# Patient Record
Sex: Female | Born: 1935 | Race: White | Hispanic: No | Smoking: Never smoker
Health system: Southern US, Community
[De-identification: ages and names within clinical notes are randomized; demographics above are authoritative.]

## PROBLEM LIST (undated history)

## (undated) DIAGNOSIS — I701 Atherosclerosis of renal artery: Secondary | ICD-10-CM

## (undated) DIAGNOSIS — G4733 Obstructive sleep apnea (adult) (pediatric): Secondary | ICD-10-CM

## (undated) DIAGNOSIS — K635 Polyp of colon: Secondary | ICD-10-CM

## (undated) DIAGNOSIS — R51 Headache: Secondary | ICD-10-CM

## (undated) DIAGNOSIS — M1990 Unspecified osteoarthritis, unspecified site: Secondary | ICD-10-CM

## (undated) DIAGNOSIS — M545 Low back pain, unspecified: Secondary | ICD-10-CM

## (undated) DIAGNOSIS — R519 Headache, unspecified: Secondary | ICD-10-CM

## (undated) DIAGNOSIS — N200 Calculus of kidney: Secondary | ICD-10-CM

## (undated) DIAGNOSIS — C3490 Malignant neoplasm of unspecified part of unspecified bronchus or lung: Secondary | ICD-10-CM

## (undated) DIAGNOSIS — I2510 Atherosclerotic heart disease of native coronary artery without angina pectoris: Secondary | ICD-10-CM

## (undated) DIAGNOSIS — I951 Orthostatic hypotension: Secondary | ICD-10-CM

## (undated) DIAGNOSIS — G8929 Other chronic pain: Secondary | ICD-10-CM

## (undated) DIAGNOSIS — E871 Hypo-osmolality and hyponatremia: Secondary | ICD-10-CM

## (undated) DIAGNOSIS — R112 Nausea with vomiting, unspecified: Secondary | ICD-10-CM

## (undated) DIAGNOSIS — E785 Hyperlipidemia, unspecified: Secondary | ICD-10-CM

## (undated) DIAGNOSIS — Z9989 Dependence on other enabling machines and devices: Secondary | ICD-10-CM

## (undated) DIAGNOSIS — R040 Epistaxis: Secondary | ICD-10-CM

## (undated) DIAGNOSIS — I10 Essential (primary) hypertension: Secondary | ICD-10-CM

## (undated) DIAGNOSIS — I739 Peripheral vascular disease, unspecified: Secondary | ICD-10-CM

## (undated) DIAGNOSIS — I779 Disorder of arteries and arterioles, unspecified: Secondary | ICD-10-CM

## (undated) DIAGNOSIS — Z9889 Other specified postprocedural states: Secondary | ICD-10-CM

## (undated) HISTORY — PX: TOTAL HIP ARTHROPLASTY: SHX124

## (undated) HISTORY — PX: APPENDECTOMY: SHX54

## (undated) HISTORY — PX: REPLACEMENT TOTAL KNEE: SUR1224

## (undated) HISTORY — DX: Hypo-osmolality and hyponatremia: E87.1

## (undated) HISTORY — DX: Disorder of arteries and arterioles, unspecified: I77.9

## (undated) HISTORY — DX: Polyp of colon: K63.5

## (undated) HISTORY — PX: JOINT REPLACEMENT: SHX530

## (undated) HISTORY — DX: Malignant neoplasm of unspecified part of unspecified bronchus or lung: C34.90

## (undated) HISTORY — DX: Calculus of kidney: N20.0

## (undated) HISTORY — PX: TUBAL LIGATION: SHX77

## (undated) HISTORY — DX: Hyperlipidemia, unspecified: E78.5

## (undated) HISTORY — DX: Peripheral vascular disease, unspecified: I73.9

## (undated) HISTORY — DX: Epistaxis: R04.0

## (undated) HISTORY — PX: CATARACT EXTRACTION W/ INTRAOCULAR LENS  IMPLANT, BILATERAL: SHX1307

## (undated) HISTORY — DX: Orthostatic hypotension: I95.1

## (undated) HISTORY — DX: Unspecified osteoarthritis, unspecified site: M19.90

---

## 1978-12-05 HISTORY — PX: VAGINAL HYSTERECTOMY: SUR661

## 1998-12-05 HISTORY — PX: BILATERAL OOPHORECTOMY: SHX1221

## 2001-04-05 DIAGNOSIS — C3490 Malignant neoplasm of unspecified part of unspecified bronchus or lung: Secondary | ICD-10-CM

## 2001-04-05 HISTORY — DX: Malignant neoplasm of unspecified part of unspecified bronchus or lung: C34.90

## 2001-04-05 HISTORY — PX: LUNG LOBECTOMY: SHX167

## 2002-11-19 LAB — HM COLONOSCOPY

## 2007-05-21 LAB — HM COLONOSCOPY: HM Colonoscopy: NORMAL

## 2010-06-25 LAB — HM DEXA SCAN

## 2011-05-21 ENCOUNTER — Ambulatory Visit (INDEPENDENT_AMBULATORY_CARE_PROVIDER_SITE_OTHER)
Admission: RE | Admit: 2011-05-21 | Discharge: 2011-05-21 | Disposition: A | Discharge status: Home / Self Care | Payer: Medicare Other | Source: Ambulatory Visit | Attending: Internal Medicine | Admitting: Internal Medicine

## 2011-05-21 ENCOUNTER — Encounter: Payer: Self-pay | Admitting: Internal Medicine

## 2011-05-21 ENCOUNTER — Ambulatory Visit (INDEPENDENT_AMBULATORY_CARE_PROVIDER_SITE_OTHER): Payer: Medicare Other | Admitting: Internal Medicine

## 2011-05-21 DIAGNOSIS — E785 Hyperlipidemia, unspecified: Secondary | ICD-10-CM

## 2011-05-21 DIAGNOSIS — C3490 Malignant neoplasm of unspecified part of unspecified bronchus or lung: Secondary | ICD-10-CM

## 2011-05-21 DIAGNOSIS — I6529 Occlusion and stenosis of unspecified carotid artery: Secondary | ICD-10-CM

## 2011-05-21 DIAGNOSIS — G4730 Sleep apnea, unspecified: Secondary | ICD-10-CM | POA: Diagnosis not present

## 2011-05-21 DIAGNOSIS — M1990 Unspecified osteoarthritis, unspecified site: Secondary | ICD-10-CM

## 2011-05-21 DIAGNOSIS — Z85118 Personal history of other malignant neoplasm of bronchus and lung: Secondary | ICD-10-CM | POA: Diagnosis not present

## 2011-05-21 LAB — CBC WITH DIFFERENTIAL/PLATELET
Basophils Absolute: 0 10*3/uL (ref 0.0–0.1)
Basophils Relative: 0.3 % (ref 0.0–3.0)
Eosinophils Absolute: 0.1 10*3/uL (ref 0.0–0.7)
Hemoglobin: 13.7 g/dL (ref 12.0–15.0)
Lymphs Abs: 2 10*3/uL (ref 0.7–4.0)
MCHC: 33.8 g/dL (ref 30.0–36.0)
MCV: 87.7 fl (ref 78.0–100.0)
Monocytes Absolute: 0.5 10*3/uL (ref 0.1–1.0)
Neutro Abs: 5 10*3/uL (ref 1.4–7.7)
RBC: 4.63 Mil/uL (ref 3.87–5.11)
RDW: 12.8 % (ref 11.5–14.6)

## 2011-05-21 LAB — COMPREHENSIVE METABOLIC PANEL
ALT: 18 U/L (ref 0–35)
AST: 23 U/L (ref 0–37)
Alkaline Phosphatase: 94 U/L (ref 39–117)
BUN: 12 mg/dL (ref 6–23)
Chloride: 104 mEq/L (ref 96–112)
Creatinine, Ser: 0.6 mg/dL (ref 0.4–1.2)
Potassium: 3.8 mEq/L (ref 3.5–5.1)

## 2011-05-21 LAB — LIPID PANEL: Total CHOL/HDL Ratio: 3

## 2011-05-21 NOTE — Progress Notes (Signed)
Subjective:    Patient ID: Tamara Burton, female    DOB: [birth year], 78 y.o.   MRN: [id number]  HPI 78 year old female with history of lung cancer status post resection, hyperlipidemia, sleep apnea, and carotid stenosis presents to establish care. In regards to her lung cancer, she notes that it was diagnosed on routine x-ray. She reports that her cancer was resected in she did not receive any chemotherapy or radiation. She notes that she has been followed with chest x-rays every 6 months. She denies any new cough or shortness of breath. She does have occasional pleuritic pain in her left chest. She attributes this to scar tissue.  In regards to hyperlipidemia, she notes she is taking atorvastatin. She denies any side effects from this medication. She reports full compliance with the medicine. She notes she is due for cholesterol check.  In regards to carotid stenosis, she notes that she has a yearly carotid Dopplers to follow plaques. She is due for this. She denies any neck pain. She does occasionally have headache. She has never had a stroke or TIA.  In regards to sleep apnea, she notes that she was diagnosed with this many years ago and has been using CPAP ever since. She questions whether she still has sleep apnea and would like to have a repeat sleep study to evaluate.  Outpatient Encounter Prescriptions as of 05/21/2011  Medication Sig Dispense Refill  . Ascorbic Acid (VITAMIN C CR) 1500 MG TBCR Take by mouth daily.      Marland Kitchen aspirin 81 MG tablet Take 81 mg by mouth daily.      Marland Kitchen atorvastatin (LIPITOR) 20 MG tablet Take 20 mg by mouth daily.      . Cholecalciferol (VITAMIN D-3) 5000 UNITS TABS Take by mouth daily.      . clindamycin (CLEOCIN) 150 MG capsule Take 150 mg by mouth. Before dental work      . Lysine HCl 500 MG CAPS Take by mouth daily.      . Omega 3-6-9 Fatty Acids (TRIPLE OMEGA-3-6-9 PO) Take by mouth. 3 daily      . Zinc 50 MG TABS Take by mouth daily.         Review of  Systems  Constitutional: Negative for fever, chills, appetite change, fatigue and unexpected weight change.  HENT: Negative for ear pain, congestion, sore throat, trouble swallowing, neck pain, voice change and sinus pressure.   Eyes: Negative for visual disturbance.  Respiratory: Negative for cough, shortness of breath, wheezing and stridor.   Cardiovascular: Negative for chest pain, palpitations and leg swelling.  Gastrointestinal: Negative for nausea, vomiting, abdominal pain, diarrhea, constipation, blood in stool, abdominal distention and anal bleeding.  Genitourinary: Negative for dysuria and flank pain.  Musculoskeletal: Negative for myalgias, arthralgias and gait problem.  Skin: Negative for color change and rash.  Neurological: Negative for dizziness and headaches.  Hematological: Negative for adenopathy. Does not bruise/bleed easily.  Psychiatric/Behavioral: Negative for suicidal ideas, sleep disturbance and dysphoric mood. The patient is not nervous/anxious.    BP 140/87  Pulse 86  Temp(Src) 98.6 F (37 C) (Oral)  Resp 12  Ht 5\' 1"  (1.549 m)  Wt 134 lb (60.782 kg)  BMI 25.32 kg/m2  SpO2 99%     Objective:   Physical Exam  Constitutional: She is oriented to person, place, and time. She appears well-developed and well-nourished. No distress.  HENT:  Head: Normocephalic and atraumatic.  Right Ear: External ear normal.  Left Ear: External ear normal.  Nose: Nose normal.  Mouth/Throat: Oropharynx is clear and moist. No oropharyngeal exudate.  Eyes: Conjunctivae are normal. Pupils are equal, round, and reactive to light. Right eye exhibits no discharge. Left eye exhibits no discharge. No scleral icterus.  Neck: Normal range of motion. Neck supple. Normal carotid pulses present. Carotid bruit is not present. No tracheal deviation present. No thyromegaly present.  Cardiovascular: Normal rate, regular rhythm, normal heart sounds and intact distal pulses.  Exam reveals no gallop  and no friction rub.   No murmur heard. Pulmonary/Chest: Effort normal and breath sounds normal. No respiratory distress. She has no wheezes. She has no rales. She exhibits no tenderness.  Abdominal: Soft. Bowel sounds are normal. She exhibits no distension and no mass. There is no tenderness. There is no rebound and no guarding.  Musculoskeletal: Normal range of motion. She exhibits no edema and no tenderness.  Lymphadenopathy:    She has no cervical adenopathy.  Neurological: She is alert and oriented to person, place, and time. No cranial nerve deficit. She exhibits normal muscle tone. Coordination normal.  Skin: Skin is warm and dry. No rash noted. She is not diaphoretic. No erythema. No pallor.  Psychiatric: She has a normal mood and affect. Her behavior is normal. Judgment and thought content normal.          Assessment & Plan:

## 2011-05-21 NOTE — Assessment & Plan Note (Signed)
Will check lipids and LFTs with labs today. Continue atorvastatin. Followup in 6 months.

## 2011-05-21 NOTE — Assessment & Plan Note (Signed)
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 She does intermittently have some pain at both of these sites as well as in her right knee. She feels that this is manageable at this point. We'll continue to monitor. If symptoms progress, will set up orthopedic referral.

## 2011-05-21 NOTE — Assessment & Plan Note (Signed)
Patient reports history of carotid stenosis. Will get records for review. She notes that she is due for a repeat carotid ultrasound. We'll set this up at [facility]. Followup 6 months.

## 2011-05-21 NOTE — Assessment & Plan Note (Signed)
Patient reports history of sleep apnea. She notes that last sleep study was several years ago. Will set her up for evaluation with sleep specialist to see if repeat study might be helpful.

## 2011-05-21 NOTE — Assessment & Plan Note (Signed)
Patient reports history of lung cancer status post resection. Will get records for review. Will get chest x-ray today. Follow up 6 months.

## 2011-06-04 DIAGNOSIS — H04129 Dry eye syndrome of unspecified lacrimal gland: Secondary | ICD-10-CM | POA: Diagnosis not present

## 2011-06-10 DIAGNOSIS — I10 Essential (primary) hypertension: Secondary | ICD-10-CM | POA: Diagnosis not present

## 2011-06-10 DIAGNOSIS — I6529 Occlusion and stenosis of unspecified carotid artery: Secondary | ICD-10-CM | POA: Diagnosis not present

## 2011-06-10 DIAGNOSIS — M1990 Unspecified osteoarthritis, unspecified site: Secondary | ICD-10-CM | POA: Diagnosis not present

## 2011-06-10 DIAGNOSIS — E785 Hyperlipidemia, unspecified: Secondary | ICD-10-CM | POA: Diagnosis not present

## 2011-07-13 ENCOUNTER — Telehealth: Payer: Self-pay | Admitting: Internal Medicine

## 2011-07-13 NOTE — Telephone Encounter (Signed)
I called patient to let her know that we had her records that she had brought in and she could come and pick them up.  While I was on the phone she asked if we could please send a prescription for her Atrovastatin generic for Lipitor 20 [facility] to [facility] her ID# is [id number].  I advised patient that I would put phone note in for medication that we preferred the patient to contact pharmacy and have them fax [place] a request.  She stated that Dr. Dan Humphreys hadn't ever sent in prescription for her.

## 2011-07-14 MED ORDER — ATORVASTATIN CALCIUM 20 MG PO TABS: 20.0000 mg | ORAL_TABLET | Freq: Every day | ORAL | Status: DC

## 2011-07-14 NOTE — Telephone Encounter (Signed)
Medication sent to pharmacy at patients request 

## 2011-07-16 ENCOUNTER — Other Ambulatory Visit: Payer: Self-pay | Admitting: *Deleted

## 2011-07-16 NOTE — Telephone Encounter (Signed)
Tried calling patient, but got no answer. Left message asking patient to call the call a nurse line since it is the weekend. I can not call her in the med because I do not have a pharmacy listed other than the mail order.

## 2011-07-16 NOTE — Telephone Encounter (Signed)
Triage Record Num: [id number] Operator: Lyn Hollingshead Patient Name: Tamara Burton Call Date & Time: 07/16/2011 1:28:28PM Patient Phone: [phone] PCP: Ronna Polio Patient Gender: Female PCP Fax : [phone] Patient DOB: 20-Jul-1933 Practice Name: [facility] Reason for Call: Caller: Jaya/Mother; PCP: Ronna Polio; CB#: [phone]; ; ; Call regarding Tick Was Removed, the Area Is Red and Bump It Itches; Onset- 07/15/11 Afebrile. Pt daughter pulled a tick off of her shoulder. There is a dime size area of redness, denies rash. Pt states she suspects it got on her last night.She thought it was one of her moles that was itching. Emergent s/s of Bites and Stings protocol r/o. Home care advice given. Protocol(s) Used: Bites and Stings - Insects or Spiders Recommended Outcome per Protocol: Provide Home/Self Care Reason for Outcome: Tick bite Care Advice: ~ Call provider if symptoms worsen or new symptoms develop. If, more than 24 hours after the incident, the sting/bite site or area around sting/bite site becomes increasingly swollen, red or painful, or has a purulent or foul smelling discharge, or red streaks develop leading away from the site, call provider immediately. ~ If you find a tick on yourself, it is important to take it off as soon as possible. The tick transmitting [facility] Spotted Fever can cause an infection after several hours of attachment. The risk of getting Lyme disease is much lower if the tick is removed within 36 hours. ~ See a provider today if develop rash (may be bulls-eye type or red, spotted rash), fever, headache; joint or muscle pain, or swollen lymph nodes within 30 days of a tick bite. ~ After the tick is removed, wash hands with soap and water and disinfect the bite site with iodine scrub, rubbing alcohol, or soap and water. ~ ~ SYMPTOM / CONDITION MANAGEMENT 04/

## 2011-07-16 NOTE — Telephone Encounter (Signed)
Call her in doxycycline 100 mg twice daily.,  take with food,  7 days  #14 o refills

## 2011-07-19 MED ORDER — DOXYCYCLINE HYCLATE 100 MG PO TABS: 100.0000 mg | ORAL_TABLET | Freq: Two times a day (BID) | ORAL | Status: AC

## 2011-07-19 NOTE — Telephone Encounter (Signed)
Patient returned call and was notified. Rx sent to pharmacy.  

## 2011-11-19 ENCOUNTER — Encounter: Payer: BC Managed Care – PPO | Admitting: Internal Medicine

## 2011-11-26 ENCOUNTER — Ambulatory Visit (INDEPENDENT_AMBULATORY_CARE_PROVIDER_SITE_OTHER)
Admission: RE | Admit: 2011-11-26 | Discharge: 2011-11-26 | Disposition: A | Discharge status: Home / Self Care | Payer: Medicare Other | Source: Ambulatory Visit | Attending: Internal Medicine | Admitting: Internal Medicine

## 2011-11-26 ENCOUNTER — Encounter: Payer: Self-pay | Admitting: Internal Medicine

## 2011-11-26 ENCOUNTER — Ambulatory Visit (INDEPENDENT_AMBULATORY_CARE_PROVIDER_SITE_OTHER): Payer: Medicare Other | Admitting: Internal Medicine

## 2011-11-26 VITALS — BP 142/82 | HR 71 | Temp 98.3°F | Ht 61.0 in | Wt 133.0 lb

## 2011-11-26 DIAGNOSIS — Z0000 Encounter for general adult medical examination without abnormal findings: Secondary | ICD-10-CM

## 2011-11-26 DIAGNOSIS — Z1239 Encounter for other screening for malignant neoplasm of breast: Secondary | ICD-10-CM | POA: Diagnosis not present

## 2011-11-26 DIAGNOSIS — Z85118 Personal history of other malignant neoplasm of bronchus and lung: Secondary | ICD-10-CM

## 2011-11-26 DIAGNOSIS — M1990 Unspecified osteoarthritis, unspecified site: Secondary | ICD-10-CM

## 2011-11-26 DIAGNOSIS — E785 Hyperlipidemia, unspecified: Secondary | ICD-10-CM | POA: Insufficient documentation

## 2011-11-26 DIAGNOSIS — D649 Anemia, unspecified: Secondary | ICD-10-CM

## 2011-11-26 DIAGNOSIS — H9190 Unspecified hearing loss, unspecified ear: Secondary | ICD-10-CM

## 2011-11-26 DIAGNOSIS — R918 Other nonspecific abnormal finding of lung field: Secondary | ICD-10-CM | POA: Diagnosis not present

## 2011-11-26 MED ORDER — ATORVASTATIN CALCIUM 20 MG PO TABS: 20.0000 mg | ORAL_TABLET | Freq: Every day | ORAL | Status: DC

## 2011-11-26 NOTE — Assessment & Plan Note (Signed)
Occasional arthralgia noted by patient. Symptoms currently well-controlled with over-the-counter nonsteroidal medications. Will continue.

## 2011-11-26 NOTE — Assessment & Plan Note (Signed)
Hearing loss noted. Will set up audiology referral for evaluation.

## 2011-11-26 NOTE — Progress Notes (Signed)
Subjective:    Patient ID: Tamara Burton, female    DOB: [birth year], 78 y.o.   MRN: [id number]  HPI [name] for annual Medicare wellness examination and management of other chronic and acute problems.   The risk factors are reflected in the social history.  The roster of all physicians providing medical care to patient - is listed in the Snapshot section of the chart.  Activities of daily living:  The patient is 100% independent in all ADLs: dressing, toileting, feeding as well as independent mobility  Home safety : The patient has smoke detectors in the home. They wear seatbelts.  There are no firearms at home. There is no violence in the home.   There is no risks for hepatitis, STDs or HIV. There is no history of blood transfusion. They have no travel history to infectious disease endemic areas of the world.  The patient has seen their dentist in the last six month [facility]). They have seen their eye doctor in the last year [facility]).  Occasional noted loss of hearing, esp in left ear. Occasional ringing in ears.  They have deferred audiologic testing in the last year. Interested in setting up Audiology eval.  They do not  have excessive sun exposure. Discussed the need for sun protection: hats, long sleeves and use of sunscreen if there is significant sun exposure.   Diet: the importance of a healthy diet is discussed. They do have a healthy diet.  The benefits of regular aerobic exercise were discussed. She walks daily either outside or treadmill.  HCPOA - in place.  Depression screen: there are no signs or vegative symptoms of depression- irritability, change in appetite, anhedonia, sadness/tearfullness, with exception of grief with death of grandson this year.  Cognitive assessment: the patient manages all their financial and personal affairs and is actively engaged. They could relate day,date,year and events.  The following portions of the patient's  history were reviewed and updated as appropriate: allergies, current medications, past family history, past medical history,  past surgical history, past social history  and problem list.  Visual acuity was not assessed per patient preference since she has regular follow up with her ophthalmologist. Hearing and body mass index were assessed and reviewed.   During the course of the visit the patient was educated and counseled about appropriate screening and preventive services including : fall prevention , diabetes screening, nutrition counseling, colorectal cancer screening, and recommended immunizations.     Outpatient Encounter Prescriptions as of 11/26/2011  Medication Sig Dispense Refill  . Ascorbic Acid (VITAMIN C CR) 1500 MG TBCR Take by mouth daily.      Marland Kitchen aspirin 81 MG tablet Take 81 mg by mouth daily.      Marland Kitchen atorvastatin (LIPITOR) 20 MG tablet Take 1 tablet (20 mg total) by mouth daily.  90 tablet  3  . Calcium Carbonate-Vit D-Min (CALCIUM 1200 PO) Take 1 tablet by mouth 2 (two) times daily.      . Cholecalciferol (VITAMIN D-3) 5000 UNITS TABS Take by mouth daily.      . clindamycin (CLEOCIN) 150 MG capsule Take 150 mg by mouth. Before dental work      . Lysine HCl 500 MG CAPS Take by mouth daily.      . Omega 3-6-9 Fatty Acids (TRIPLE OMEGA-3-6-9 PO) Take by mouth. 3 daily      . Zinc 50 MG TABS Take by mouth daily.      Marland Kitchen DISCONTD: atorvastatin (LIPITOR)  20 MG tablet Take 1 tablet (20 mg total) by mouth daily.  90 tablet  1    Review of Systems  Constitutional: Negative for fever, chills, appetite change, fatigue and unexpected weight change.  HENT: Positive for hearing loss. Negative for ear pain, congestion, sore throat, trouble swallowing, neck pain, voice change and sinus pressure.   Eyes: Negative for visual disturbance.  Respiratory: Negative for cough, shortness of breath, wheezing and stridor.   Cardiovascular: Negative for chest pain, palpitations and leg swelling.    Gastrointestinal: Negative for nausea, vomiting, abdominal pain, diarrhea, constipation, blood in stool, abdominal distention and anal bleeding.  Genitourinary: Negative for dysuria and flank pain.  Musculoskeletal: Positive for arthralgias. Negative for myalgias and gait problem.  Skin: Negative for color change and rash.  Neurological: Negative for dizziness and headaches.  Hematological: Negative for adenopathy. Does not bruise/bleed easily.  Psychiatric/Behavioral: Negative for suicidal ideas, disturbed wake/sleep cycle and dysphoric mood. The patient is not nervous/anxious.    BP 142/82  Pulse 71  Temp 98.3 F (36.8 C) (Oral)  Ht 5\' 1"  (1.549 m)  Wt 133 lb (60.328 kg)  BMI 25.13 kg/m2  SpO2 97%     Objective:   Physical Exam  Constitutional: She is oriented to person, place, and time. She appears well-developed and well-nourished. No distress.  HENT:  Head: Normocephalic and atraumatic.  Right Ear: External ear normal.  Left Ear: External ear normal.  Nose: Nose normal.  Mouth/Throat: Oropharynx is clear and moist. No oropharyngeal exudate.  Eyes: Conjunctivae are normal. Pupils are equal, round, and reactive to light. Right eye exhibits no discharge. Left eye exhibits no discharge. No scleral icterus.  Neck: Normal range of motion. Neck supple. No tracheal deviation present. No thyromegaly present.  Cardiovascular: Normal rate, regular rhythm, normal heart sounds and intact distal pulses.  Exam reveals no gallop and no friction rub.   No murmur heard. Pulmonary/Chest: Effort normal and breath sounds normal. No respiratory distress. She has no wheezes. She has no rales. She exhibits no tenderness.  Abdominal: Soft. Bowel sounds are normal. She exhibits no distension and no mass. There is no tenderness. There is no rebound and no guarding.  Musculoskeletal: Normal range of motion. She exhibits no edema and no tenderness.  Lymphadenopathy:    She has no cervical adenopathy.   Neurological: She is alert and oriented to person, place, and time. No cranial nerve deficit. She exhibits normal muscle tone. Coordination normal.  Skin: Skin is warm and dry. No rash noted. She is not diaphoretic. No erythema. No pallor.  Psychiatric: She has a normal mood and affect. Her behavior is normal. Judgment and thought content normal.          Assessment & Plan:

## 2011-11-26 NOTE — Assessment & Plan Note (Signed)
Will plan to repeat six-month screening chest x-ray.

## 2011-11-26 NOTE — Assessment & Plan Note (Signed)
Will check fasting lipids and LFTs with labs.  

## 2011-11-26 NOTE — Assessment & Plan Note (Signed)
General exam normal today. Patient is up-to-date on health maintenance with the exception of mammogram which will be scheduled. Vaccinations are up-to-date. Screening today was remarkable for hearing loss and audiology referral was placed. Encourage continued efforts at healthy lifestyle including high-fiber diet low in saturated fat and regular physical activity. Followup 6 months.

## 2011-11-26 NOTE — Assessment & Plan Note (Signed)
Order for mammogram place today.

## 2011-11-29 DIAGNOSIS — H903 Sensorineural hearing loss, bilateral: Secondary | ICD-10-CM | POA: Diagnosis not present

## 2011-12-01 ENCOUNTER — Other Ambulatory Visit (INDEPENDENT_AMBULATORY_CARE_PROVIDER_SITE_OTHER): Payer: Medicare Other | Admitting: *Deleted

## 2011-12-01 DIAGNOSIS — E785 Hyperlipidemia, unspecified: Secondary | ICD-10-CM

## 2011-12-01 DIAGNOSIS — D649 Anemia, unspecified: Secondary | ICD-10-CM | POA: Diagnosis not present

## 2011-12-01 LAB — CBC WITH DIFFERENTIAL/PLATELET
Basophils Absolute: 0 K/uL (ref 0.0–0.1)
Basophils Relative: 0.4 % (ref 0.0–3.0)
Eosinophils Absolute: 0.1 K/uL (ref 0.0–0.7)
Eosinophils Relative: 1.8 % (ref 0.0–5.0)
HCT: 39.6 % (ref 36.0–46.0)
Hemoglobin: 13.1 g/dL (ref 12.0–15.0)
Lymphocytes Relative: 33.8 % (ref 12.0–46.0)
Lymphs Abs: 2 K/uL (ref 0.7–4.0)
MCHC: 33.1 g/dL (ref 30.0–36.0)
MCV: 89.3 fl (ref 78.0–100.0)
Monocytes Absolute: 0.5 K/uL (ref 0.1–1.0)
Monocytes Relative: 8.3 % (ref 3.0–12.0)
Neutro Abs: 3.3 K/uL (ref 1.4–7.7)
Neutrophils Relative %: 55.7 % (ref 43.0–77.0)
Platelets: 194 K/uL (ref 150.0–400.0)
RBC: 4.44 Mil/uL (ref 3.87–5.11)
RDW: 13.1 % (ref 11.5–14.6)
WBC: 5.9 K/uL (ref 4.5–10.5)

## 2011-12-01 LAB — COMPREHENSIVE METABOLIC PANEL WITH GFR
ALT: 17 U/L (ref 0–35)
AST: 25 U/L (ref 0–37)
Albumin: 4 g/dL (ref 3.5–5.2)
Alkaline Phosphatase: 80 U/L (ref 39–117)
BUN: 13 mg/dL (ref 6–23)
CO2: 30 meq/L (ref 19–32)
Calcium: 9.1 mg/dL (ref 8.4–10.5)
Chloride: 103 meq/L (ref 96–112)
Creatinine, Ser: 0.7 mg/dL (ref 0.4–1.2)
GFR: 85 mL/min
Glucose, Bld: 92 mg/dL (ref 70–99)
Potassium: 4.2 meq/L (ref 3.5–5.1)
Sodium: 141 meq/L (ref 135–145)
Total Bilirubin: 1 mg/dL (ref 0.3–1.2)
Total Protein: 6.4 g/dL (ref 6.0–8.3)

## 2011-12-01 LAB — LIPID PANEL
LDL Cholesterol: 101 mg/dL — ABNORMAL HIGH (ref 0–99)
VLDL: 18 mg/dL (ref 0.0–40.0)

## 2011-12-13 DIAGNOSIS — I10 Essential (primary) hypertension: Secondary | ICD-10-CM | POA: Diagnosis not present

## 2011-12-13 DIAGNOSIS — E669 Obesity, unspecified: Secondary | ICD-10-CM | POA: Diagnosis not present

## 2011-12-13 DIAGNOSIS — I6529 Occlusion and stenosis of unspecified carotid artery: Secondary | ICD-10-CM | POA: Diagnosis not present

## 2011-12-13 DIAGNOSIS — E785 Hyperlipidemia, unspecified: Secondary | ICD-10-CM | POA: Diagnosis not present

## 2011-12-17 ENCOUNTER — Ambulatory Visit: Payer: Self-pay | Admitting: Internal Medicine

## 2011-12-17 DIAGNOSIS — Z1231 Encounter for screening mammogram for malignant neoplasm of breast: Secondary | ICD-10-CM | POA: Diagnosis not present

## 2012-01-11 ENCOUNTER — Encounter: Payer: Self-pay | Admitting: Internal Medicine

## 2012-01-12 DIAGNOSIS — Z23 Encounter for immunization: Secondary | ICD-10-CM | POA: Diagnosis not present

## 2012-02-02 ENCOUNTER — Encounter: Payer: Self-pay | Admitting: Internal Medicine

## 2012-02-02 DIAGNOSIS — M169 Osteoarthritis of hip, unspecified: Secondary | ICD-10-CM

## 2012-02-02 DIAGNOSIS — Z1283 Encounter for screening for malignant neoplasm of skin: Secondary | ICD-10-CM

## 2012-02-03 ENCOUNTER — Encounter: Payer: Self-pay | Admitting: Internal Medicine

## 2012-02-09 DIAGNOSIS — Z96649 Presence of unspecified artificial hip joint: Secondary | ICD-10-CM | POA: Diagnosis not present

## 2012-03-07 ENCOUNTER — Encounter: Payer: Self-pay | Admitting: Internal Medicine

## 2012-03-23 DIAGNOSIS — L819 Disorder of pigmentation, unspecified: Secondary | ICD-10-CM | POA: Diagnosis not present

## 2012-03-23 DIAGNOSIS — L820 Inflamed seborrheic keratosis: Secondary | ICD-10-CM | POA: Diagnosis not present

## 2012-03-23 DIAGNOSIS — L821 Other seborrheic keratosis: Secondary | ICD-10-CM | POA: Diagnosis not present

## 2012-04-20 DIAGNOSIS — Q142 Congenital malformation of optic disc: Secondary | ICD-10-CM | POA: Diagnosis not present

## 2012-05-20 ENCOUNTER — Other Ambulatory Visit: Payer: Self-pay

## 2012-06-13 ENCOUNTER — Telehealth: Payer: Self-pay | Admitting: Internal Medicine

## 2012-06-13 NOTE — Telephone Encounter (Signed)
Patient informed and appointment has been rescheduled. Explain to patient her chest xray and labs will be discussed at the time of her visit.

## 2012-06-13 NOTE — Telephone Encounter (Signed)
Spoke with patient, has her annual physical in August and she usually gets her blood work done twice a year. She had lung cancer in the past, so they require her to have a chest xray done yearly. Would prefer to have her appointment in the early spring. Patient is new to the office, just moved down here last year. Calling to see how this is going to be handled since she is a patient here now? She has never had the shingles vaccine and interested in this.

## 2012-06-13 NOTE — Telephone Encounter (Signed)
Patient wants labs and a chest x-ray done before August .

## 2012-06-13 NOTE — Telephone Encounter (Signed)
Please just schedule her a visit within the next 1-2 months.

## 2012-07-06 ENCOUNTER — Encounter: Payer: Self-pay | Admitting: Internal Medicine

## 2012-07-06 ENCOUNTER — Ambulatory Visit (INDEPENDENT_AMBULATORY_CARE_PROVIDER_SITE_OTHER): Payer: Medicare Other | Admitting: Internal Medicine

## 2012-07-06 VITALS — BP 130/80 | HR 68 | Temp 98.0°F | Resp 18 | Wt 125.5 lb

## 2012-07-06 DIAGNOSIS — L659 Nonscarring hair loss, unspecified: Secondary | ICD-10-CM | POA: Insufficient documentation

## 2012-07-06 DIAGNOSIS — R5381 Other malaise: Secondary | ICD-10-CM | POA: Insufficient documentation

## 2012-07-06 DIAGNOSIS — E785 Hyperlipidemia, unspecified: Secondary | ICD-10-CM | POA: Diagnosis not present

## 2012-07-06 DIAGNOSIS — IMO0001 Reserved for inherently not codable concepts without codable children: Secondary | ICD-10-CM

## 2012-07-06 DIAGNOSIS — R5383 Other fatigue: Secondary | ICD-10-CM

## 2012-07-06 DIAGNOSIS — Z634 Disappearance and death of family member: Secondary | ICD-10-CM | POA: Insufficient documentation

## 2012-07-06 DIAGNOSIS — M791 Myalgia, unspecified site: Secondary | ICD-10-CM | POA: Insufficient documentation

## 2012-07-06 DIAGNOSIS — G4700 Insomnia, unspecified: Secondary | ICD-10-CM | POA: Insufficient documentation

## 2012-07-06 LAB — CBC WITH DIFFERENTIAL/PLATELET
Basophils Absolute: 0 10*3/uL (ref 0.0–0.1)
Eosinophils Absolute: 0.1 10*3/uL (ref 0.0–0.7)
Hemoglobin: 13.7 g/dL (ref 12.0–15.0)
Lymphocytes Relative: 29.4 % (ref 12.0–46.0)
Lymphs Abs: 2 10*3/uL (ref 0.7–4.0)
MCHC: 33.8 g/dL (ref 30.0–36.0)
Monocytes Relative: 5.7 % (ref 3.0–12.0)
Neutro Abs: 4.3 10*3/uL (ref 1.4–7.7)
Platelets: 201 10*3/uL (ref 150.0–400.0)
RDW: 12.9 % (ref 11.5–14.6)

## 2012-07-06 LAB — COMPREHENSIVE METABOLIC PANEL
ALT: 19 U/L (ref 0–35)
AST: 34 U/L (ref 0–37)
Albumin: 4.2 g/dL (ref 3.5–5.2)
Alkaline Phosphatase: 88 U/L (ref 39–117)
Potassium: 3.7 mEq/L (ref 3.5–5.1)
Sodium: 137 mEq/L (ref 135–145)
Total Protein: 6.6 g/dL (ref 6.0–8.3)

## 2012-07-06 LAB — CK: Total CK: 718 U/L — ABNORMAL HIGH (ref 7–177)

## 2012-07-06 LAB — TSH: TSH: 0.81 u[IU]/mL (ref 0.35–5.50)

## 2012-07-06 LAB — LIPID PANEL
HDL: 65 mg/dL (ref >39.00)
LDL Cholesterol: 99 mg/dL (ref 0–99)
VLDL: 21.8 mg/dL (ref 0.0–40.0)

## 2012-07-06 LAB — VITAMIN B12: Vitamin B-12: 828 pg/mL (ref 211–911)

## 2012-07-06 MED ORDER — TRAZODONE HCL 50 MG PO TABS: 25.0000 mg | ORAL_TABLET | Freq: Every evening | ORAL | Status: DC | PRN

## 2012-07-06 NOTE — Assessment & Plan Note (Signed)
Bilateral LE muscle pain. Question if this may be related to use of atorvastatin. Will check LFTs and CK with labs today.

## 2012-07-06 NOTE — Assessment & Plan Note (Signed)
Symptoms of fatigue likely related to recent stressors. Will check CBC, CMP, TSH, B12. Will treat insomnia as above. Follow up prn and in 4 months.

## 2012-07-06 NOTE — Assessment & Plan Note (Signed)
Recent loss of her dog. Offered support today. Discussed counseling through hospice.

## 2012-07-06 NOTE — Progress Notes (Signed)
Subjective:    Patient ID: Tamara Burton, female    DOB: Aug 10, 1933, 79 y.o.   MRN: [id number]  HPI 79 year old female with history of hyperlipidemia and lung cancer presents for followup. She reports that the last few months have been difficult for her. She recently lost her dog unexpectedly. She is tearful describing this. She reports good support from friends. She is having some difficulty sleeping. She has never taken medication for this. She also notes some ongoing fatigue. She denies any focal symptoms such as chest pain, shortness of breath, change in appetite or weight. She does note some thinning of her hair. She is trying to follow a healthy diet. She reports compliance with her medications.   She notes some chronic muscle pain in her bilateral lower legs. This is been ongoing for months. It occurs mostly at rest. She has not taken any medication for it. She denies any trauma to her legs. She denies any swelling or weakness in her legs.  Outpatient Encounter Prescriptions as of 07/06/2012  Medication Sig Dispense Refill  . Ascorbic Acid (VITAMIN C CR) 1500 MG TBCR Take by mouth daily.      Marland Kitchen aspirin 81 MG tablet Take 81 mg by mouth daily.      Marland Kitchen atorvastatin (LIPITOR) 20 MG tablet Take 1 tablet (20 mg total) by mouth daily.  90 tablet  3  . Calcium Carbonate-Vit D-Min (CALCIUM 1200 PO) Take 1 tablet by mouth 2 (two) times daily.      . Cholecalciferol (VITAMIN D-3) 5000 UNITS TABS Take by mouth daily.      . clindamycin (CLEOCIN) 150 MG capsule Take 150 mg by mouth. Before dental work      . Lysine HCl 500 MG CAPS Take by mouth daily.      . Omega 3-6-9 Fatty Acids (TRIPLE OMEGA-3-6-9 PO) Take by mouth. 3 daily      . Zinc 50 MG TABS Take by mouth daily.      . traZODone (DESYREL) 50 MG tablet Take 0.5-1 tablets (25-50 mg total) by mouth at bedtime as needed for sleep.  30 tablet  3   No facility-administered encounter medications on file as of 07/06/2012.   BP 130/80  Pulse 68   Temp(Src) 98 F (36.7 C) (Oral)  Resp 18  Wt 125 lb 8 oz (56.926 kg)  BMI 23.73 kg/m2  SpO2 95%  Review of Systems  Constitutional: Positive for fatigue. Negative for fever, chills, appetite change and unexpected weight change.  HENT: Negative for ear pain, congestion, sore throat, trouble swallowing, neck pain, voice change and sinus pressure.   Eyes: Negative for visual disturbance.  Respiratory: Negative for cough, shortness of breath, wheezing and stridor.   Cardiovascular: Negative for chest pain, palpitations and leg swelling.  Gastrointestinal: Negative for nausea, vomiting, abdominal pain, diarrhea, constipation, blood in stool, abdominal distention and anal bleeding.  Genitourinary: Negative for dysuria and flank pain.  Musculoskeletal: Negative for myalgias, arthralgias and gait problem.  Skin: Negative for color change and rash.  Neurological: Negative for dizziness and headaches.  Hematological: Negative for adenopathy. Does not bruise/bleed easily.  Psychiatric/Behavioral: Positive for sleep disturbance and dysphoric mood. Negative for suicidal ideas. The patient is not nervous/anxious.        Objective:   Physical Exam  Constitutional: She is oriented to person, place, and time. She appears well-developed and well-nourished. No distress.  HENT:  Head: Normocephalic and atraumatic.  Right Ear: External ear normal.  Left Ear: External ear normal.  Nose: Nose normal.  Mouth/Throat: Oropharynx is clear and moist. No oropharyngeal exudate.  Eyes: Conjunctivae are normal. Pupils are equal, round, and reactive to light. Right eye exhibits no discharge. Left eye exhibits no discharge. No scleral icterus.  Neck: Normal range of motion. Neck supple. No tracheal deviation present. No thyromegaly present.  Cardiovascular: Normal rate, regular rhythm, normal heart sounds and intact distal pulses.  Exam reveals no gallop and no friction rub.   No murmur heard. Pulmonary/Chest:  Effort normal and breath sounds normal. No accessory muscle usage. Not tachypneic. No respiratory distress. She has no decreased breath sounds. She has no wheezes. She has no rhonchi. She has no rales. She exhibits no tenderness.  Musculoskeletal: Normal range of motion. She exhibits no edema and no tenderness.  Lymphadenopathy:    She has no cervical adenopathy.  Neurological: She is alert and oriented to person, place, and time. No cranial nerve deficit. She exhibits normal muscle tone. Coordination normal.  Skin: Skin is warm and dry. No rash noted. She is not diaphoretic. No erythema. No pallor.  Psychiatric: Her speech is normal and behavior is normal. Judgment and thought content normal. Cognition and memory are normal. She exhibits a depressed mood.          Assessment & Plan:

## 2012-07-06 NOTE — Assessment & Plan Note (Signed)
Recent diffuse thinning of hair. Suspect related to recent stressors. Will check TSH and CBC with labs today.

## 2012-07-06 NOTE — Assessment & Plan Note (Signed)
Will check lipids and LFTs with labs today. Continue Atorvastatin. 

## 2012-07-06 NOTE — Assessment & Plan Note (Signed)
Symptoms of insomnia related to recent stressors. Will try Trazodone. Follow up prn and 4 months.

## 2012-07-07 ENCOUNTER — Telehealth: Payer: Self-pay | Admitting: *Deleted

## 2012-07-07 NOTE — Telephone Encounter (Signed)
No, she needs a CMP and CK for rhabdomyolysis

## 2012-07-07 NOTE — Telephone Encounter (Signed)
Pt is coming in for labs Monday 04.07.2014, is it just for a CMP ?  Thank you

## 2012-07-10 ENCOUNTER — Other Ambulatory Visit (INDEPENDENT_AMBULATORY_CARE_PROVIDER_SITE_OTHER): Payer: Medicare Other

## 2012-07-10 ENCOUNTER — Other Ambulatory Visit: Payer: Self-pay | Admitting: *Deleted

## 2012-07-10 DIAGNOSIS — M6282 Rhabdomyolysis: Secondary | ICD-10-CM

## 2012-07-10 LAB — COMPREHENSIVE METABOLIC PANEL
AST: 27 U/L (ref 0–37)
Alkaline Phosphatase: 89 U/L (ref 39–117)
BUN: 13 mg/dL (ref 6–23)
Glucose, Bld: 77 mg/dL (ref 70–99)
Sodium: 142 mEq/L (ref 135–145)
Total Bilirubin: 0.6 mg/dL (ref 0.3–1.2)

## 2012-07-11 ENCOUNTER — Telehealth: Payer: Self-pay | Admitting: *Deleted

## 2012-07-11 NOTE — Telephone Encounter (Signed)
Message copied by Theola Sequin on Tue Jul 11, 2012  8:51 AM ------      Message from: Ronna Polio A      Created: Mon Jul 10, 2012 12:04 PM       Labs show that markers of muscle damage have improved. Has muscle pain improved? I would like for pt to stay off Atorvastatin. ------

## 2012-07-11 NOTE — Telephone Encounter (Signed)
Spoke with patient in reference to her lab results, she state her muscle pain has not improved, it is about the same. Would like to know if you would like her to take something in place of the Atorvastatin?

## 2012-07-11 NOTE — Telephone Encounter (Signed)
I would like for her to stay off all statins for now. I would like to see her back in 2 weeks, at which time we can repeat a CK level. If pain is severe, she will need to be seen sooner.

## 2012-07-11 NOTE — Telephone Encounter (Signed)
Left message to call back  

## 2012-07-12 NOTE — Telephone Encounter (Signed)
Pt.notified

## 2012-07-18 ENCOUNTER — Other Ambulatory Visit: Payer: Self-pay | Admitting: Internal Medicine

## 2012-07-18 ENCOUNTER — Encounter: Payer: Self-pay | Admitting: Internal Medicine

## 2012-08-11 ENCOUNTER — Ambulatory Visit: Payer: Medicare Other | Admitting: Internal Medicine

## 2012-08-31 ENCOUNTER — Ambulatory Visit (INDEPENDENT_AMBULATORY_CARE_PROVIDER_SITE_OTHER): Payer: Medicare Other | Admitting: Internal Medicine

## 2012-08-31 ENCOUNTER — Encounter: Payer: Self-pay | Admitting: Internal Medicine

## 2012-08-31 VITALS — BP 140/76 | HR 69 | Temp 98.3°F | Wt 125.0 lb

## 2012-08-31 DIAGNOSIS — M791 Myalgia, unspecified site: Secondary | ICD-10-CM

## 2012-08-31 DIAGNOSIS — IMO0001 Reserved for inherently not codable concepts without codable children: Secondary | ICD-10-CM | POA: Diagnosis not present

## 2012-08-31 DIAGNOSIS — Z789 Other specified health status: Secondary | ICD-10-CM

## 2012-08-31 DIAGNOSIS — E785 Hyperlipidemia, unspecified: Secondary | ICD-10-CM | POA: Diagnosis not present

## 2012-08-31 DIAGNOSIS — Z888 Allergy status to other drugs, medicaments and biological substances status: Secondary | ICD-10-CM | POA: Diagnosis not present

## 2012-08-31 DIAGNOSIS — Z85118 Personal history of other malignant neoplasm of bronchus and lung: Secondary | ICD-10-CM

## 2012-08-31 NOTE — Progress Notes (Signed)
Subjective:    Patient ID: Tamara Burton, female    DOB: [birth year], 79 y.o.   MRN: [id number]  HPI 79 year old female with history of lung cancer, hyperlipidemia presents for followup after recent episode of myalgia. Symptoms have completely resolved after stopping statin medication. CK level is now normal. She reports she is feeling well. She is following a healthy diet and exercising by walking daily. She denies any new concerns today.  Outpatient Encounter Prescriptions as of 08/31/2012  Medication Sig Dispense Refill  . Ascorbic Acid (VITAMIN C CR) 1500 MG TBCR Take by mouth daily.      Marland Kitchen aspirin 81 MG tablet Take 81 mg by mouth daily.      . Calcium Carbonate-Vit D-Min (CALCIUM 1200 PO) Take 1 tablet by mouth 2 (two) times daily.      . Cholecalciferol (VITAMIN D-3) 5000 UNITS TABS Take by mouth daily.      Marland Kitchen Lysine HCl 500 MG CAPS Take by mouth daily.      . traZODone (DESYREL) 50 MG tablet Take 0.5-1 tablets (25-50 mg total) by mouth at bedtime as needed for sleep.  30 tablet  3  . Zinc 50 MG TABS Take by mouth daily.      . [DISCONTINUED] clindamycin (CLEOCIN) 150 MG capsule Take 150 mg by mouth. Before dental work      . [DISCONTINUED] Omega 3-6-9 Fatty Acids (TRIPLE OMEGA-3-6-9 PO) Take by mouth. 3 daily       No facility-administered encounter medications on file as of 08/31/2012.   BP 140/76  Pulse 69  Temp(Src) 98.3 F (36.8 C) (Oral)  Wt 125 lb (56.7 kg)  BMI 23.63 kg/m2  SpO2 96%  Review of Systems  Constitutional: Negative for fever, chills, appetite change, fatigue and unexpected weight change.  HENT: Negative for ear pain, congestion, sore throat, trouble swallowing, neck pain, voice change and sinus pressure.   Eyes: Negative for visual disturbance.  Respiratory: Negative for cough, shortness of breath, wheezing and stridor.   Cardiovascular: Negative for chest pain, palpitations and leg swelling.  Gastrointestinal: Negative for nausea, vomiting, abdominal pain,  diarrhea, constipation, blood in stool, abdominal distention and anal bleeding.  Genitourinary: Negative for dysuria and flank pain.  Musculoskeletal: Negative for myalgias, arthralgias and gait problem.  Skin: Negative for color change and rash.  Neurological: Negative for dizziness and headaches.  Hematological: Negative for adenopathy. Does not bruise/bleed easily.  Psychiatric/Behavioral: Negative for suicidal ideas, sleep disturbance and dysphoric mood. The patient is not nervous/anxious.        Objective:   Physical Exam  Constitutional: She is oriented to person, place, and time. She appears well-developed and well-nourished. No distress.  HENT:  Head: Normocephalic and atraumatic.  Right Ear: External ear normal.  Left Ear: External ear normal.  Nose: Nose normal.  Mouth/Throat: Oropharynx is clear and moist. No oropharyngeal exudate.  Eyes: Conjunctivae are normal. Pupils are equal, round, and reactive to light. Right eye exhibits no discharge. Left eye exhibits no discharge. No scleral icterus.  Neck: Normal range of motion. Neck supple. No tracheal deviation present. No thyromegaly present.  Cardiovascular: Normal rate, regular rhythm, normal heart sounds and intact distal pulses.  Exam reveals no gallop and no friction rub.   No murmur heard. Pulmonary/Chest: Effort normal and breath sounds normal. No accessory muscle usage. Not tachypneic. No respiratory distress. She has no decreased breath sounds. She has no wheezes. She has no rhonchi. She has no rales. She exhibits no tenderness.  Musculoskeletal: Normal  range of motion. She exhibits no edema and no tenderness.  Lymphadenopathy:    She has no cervical adenopathy.  Neurological: She is alert and oriented to person, place, and time. No cranial nerve deficit. She exhibits normal muscle tone. Coordination normal.  Skin: Skin is warm and dry. No rash noted. She is not diaphoretic. No erythema. No pallor.  Psychiatric: She has  a normal mood and affect. Her behavior is normal. Judgment and thought content normal.          Assessment & Plan:

## 2012-08-31 NOTE — Assessment & Plan Note (Signed)
Will repeat annual CXR in 11/2012.

## 2012-08-31 NOTE — Assessment & Plan Note (Signed)
Encouraged healthy diet, low in processed carbohydrates and saturated fat. Encouraged regular exercise, goal of 3x per week.  She is intolerant to statins, so will refrain from use.

## 2012-08-31 NOTE — Assessment & Plan Note (Signed)
Myalgia with elevated CK, now normalized after stopping statin.

## 2012-08-31 NOTE — Assessment & Plan Note (Signed)
Myalgia resolved after stopping statin. CK level normalized. Will refrain from repeat use of statins. Will monitor symptoms.

## 2012-09-08 ENCOUNTER — Ambulatory Visit (INDEPENDENT_AMBULATORY_CARE_PROVIDER_SITE_OTHER)
Admission: RE | Admit: 2012-09-08 | Discharge: 2012-09-08 | Disposition: A | Discharge status: Home / Self Care | Payer: Medicare Other | Source: Ambulatory Visit | Attending: Internal Medicine | Admitting: Internal Medicine

## 2012-09-08 DIAGNOSIS — Z85118 Personal history of other malignant neoplasm of bronchus and lung: Secondary | ICD-10-CM

## 2012-09-21 ENCOUNTER — Telehealth: Payer: Self-pay | Admitting: Internal Medicine

## 2012-09-21 NOTE — Telephone Encounter (Signed)
Does pt need dr appointment or nurse appointment for the below my chart message   Appointment Request From: Tamara Burton      With Provider: Wynona Dove, MD [-Primary Care Physician-]      Preferred Date Range: From 10/09/2012 To 10/20/2012      Preferred Times: Any      Reason: To address the following health maintenance concerns.   Tetanus/Tdap   Zostavax      Comments:   Any day is okay not too early in the AM. Also, Do you schedule my Mamogram which is due in the Fall?      Tamara Burton

## 2012-09-22 ENCOUNTER — Encounter: Payer: Self-pay | Admitting: *Deleted

## 2012-09-25 NOTE — Telephone Encounter (Signed)
Left message to call back  

## 2012-09-27 NOTE — Telephone Encounter (Signed)
Also sent patient a mychart message.

## 2012-10-03 ENCOUNTER — Ambulatory Visit: Payer: Medicare Other

## 2012-10-05 ENCOUNTER — Ambulatory Visit: Payer: Medicare Other | Admitting: *Deleted

## 2012-10-05 DIAGNOSIS — Z23 Encounter for immunization: Secondary | ICD-10-CM

## 2012-10-05 MED ORDER — ZOSTER VACCINE LIVE 19400 UNT/0.65ML ~~LOC~~ SOLR: 0.6500 mL | Freq: Once | SUBCUTANEOUS | Status: DC

## 2012-11-30 ENCOUNTER — Encounter: Payer: Medicare Other | Admitting: Internal Medicine

## 2012-12-15 ENCOUNTER — Encounter: Payer: Self-pay | Admitting: Internal Medicine

## 2012-12-18 DIAGNOSIS — I6529 Occlusion and stenosis of unspecified carotid artery: Secondary | ICD-10-CM | POA: Diagnosis not present

## 2012-12-18 DIAGNOSIS — E785 Hyperlipidemia, unspecified: Secondary | ICD-10-CM | POA: Diagnosis not present

## 2012-12-18 DIAGNOSIS — I2510 Atherosclerotic heart disease of native coronary artery without angina pectoris: Secondary | ICD-10-CM | POA: Diagnosis not present

## 2012-12-18 DIAGNOSIS — I10 Essential (primary) hypertension: Secondary | ICD-10-CM | POA: Diagnosis not present

## 2012-12-19 ENCOUNTER — Ambulatory Visit: Payer: Self-pay | Admitting: Internal Medicine

## 2012-12-19 DIAGNOSIS — Z1231 Encounter for screening mammogram for malignant neoplasm of breast: Secondary | ICD-10-CM | POA: Diagnosis not present

## 2012-12-29 ENCOUNTER — Encounter: Payer: Self-pay | Admitting: Internal Medicine

## 2013-01-04 DIAGNOSIS — Z23 Encounter for immunization: Secondary | ICD-10-CM | POA: Diagnosis not present

## 2013-01-11 DIAGNOSIS — L738 Other specified follicular disorders: Secondary | ICD-10-CM | POA: Diagnosis not present

## 2013-01-11 DIAGNOSIS — L820 Inflamed seborrheic keratosis: Secondary | ICD-10-CM | POA: Diagnosis not present

## 2013-01-11 DIAGNOSIS — L219 Seborrheic dermatitis, unspecified: Secondary | ICD-10-CM | POA: Diagnosis not present

## 2013-01-11 DIAGNOSIS — L821 Other seborrheic keratosis: Secondary | ICD-10-CM | POA: Diagnosis not present

## 2013-02-05 ENCOUNTER — Encounter: Payer: Self-pay | Admitting: Internal Medicine

## 2013-02-05 ENCOUNTER — Ambulatory Visit (INDEPENDENT_AMBULATORY_CARE_PROVIDER_SITE_OTHER): Payer: Medicare Other | Admitting: Internal Medicine

## 2013-02-05 VITALS — BP 150/70 | HR 74 | Temp 98.2°F | Ht 59.75 in | Wt 125.0 lb

## 2013-02-05 DIAGNOSIS — G4730 Sleep apnea, unspecified: Secondary | ICD-10-CM

## 2013-02-05 DIAGNOSIS — R5381 Other malaise: Secondary | ICD-10-CM | POA: Diagnosis not present

## 2013-02-05 DIAGNOSIS — Z23 Encounter for immunization: Secondary | ICD-10-CM | POA: Diagnosis not present

## 2013-02-05 DIAGNOSIS — Z2911 Encounter for prophylactic immunotherapy for respiratory syncytial virus (RSV): Secondary | ICD-10-CM

## 2013-02-05 DIAGNOSIS — Z136 Encounter for screening for cardiovascular disorders: Secondary | ICD-10-CM

## 2013-02-05 DIAGNOSIS — E559 Vitamin D deficiency, unspecified: Secondary | ICD-10-CM | POA: Diagnosis not present

## 2013-02-05 DIAGNOSIS — Z0000 Encounter for general adult medical examination without abnormal findings: Secondary | ICD-10-CM | POA: Diagnosis not present

## 2013-02-05 LAB — COMPREHENSIVE METABOLIC PANEL
ALT: 20 U/L (ref 0–35)
AST: 27 U/L (ref 0–37)
CO2: 31 mEq/L (ref 19–32)
Chloride: 103 mEq/L (ref 96–112)
Creatinine, Ser: 0.7 mg/dL (ref 0.4–1.2)
Sodium: 143 mEq/L (ref 135–145)
Total Bilirubin: 0.7 mg/dL (ref 0.3–1.2)
Total Protein: 6.9 g/dL (ref 6.0–8.3)

## 2013-02-05 LAB — CBC WITH DIFFERENTIAL/PLATELET
Basophils Relative: 0.5 % (ref 0.0–3.0)
Eosinophils Relative: 0.5 % (ref 0.0–5.0)
Hemoglobin: 14.2 g/dL (ref 12.0–15.0)
Lymphocytes Relative: 26.2 % (ref 12.0–46.0)
Monocytes Relative: 5.7 % (ref 3.0–12.0)
Neutro Abs: 4.7 10*3/uL (ref 1.4–7.7)
Neutrophils Relative %: 67.1 % (ref 43.0–77.0)
RBC: 4.76 Mil/uL (ref 3.87–5.11)
WBC: 7 10*3/uL (ref 4.5–10.5)

## 2013-02-05 LAB — LIPID PANEL
HDL: 74.5 mg/dL (ref >39.00)
VLDL: 22.8 mg/dL (ref 0.0–40.0)

## 2013-02-05 LAB — LDL CHOLESTEROL, DIRECT: Direct LDL: 201.7 mg/dL

## 2013-02-05 NOTE — Assessment & Plan Note (Signed)
Patient has stopped using CPAP. She would like to have repeat sleep study as has been many years since previous study. She reports that symptoms of fatigue have improved. Will request repeat study.

## 2013-02-05 NOTE — Progress Notes (Signed)
Subjective:    Patient ID: Tamara Burton, female    DOB: March 21, 1934, 79 y.o.   MRN: [id number]  HPI [name] for annual Medicare wellness examination and management of other chronic and acute problems.   The risk factors are reflected in the social history.  The roster of all physicians providing medical care to patient - is listed in the Snapshot section of the chart.  Activities of daily living:  The patient is 100% independent in all ADLs: dressing, toileting, feeding as well as independent mobility  Home safety : The patient has smoke detectors in the home. They wear seatbelts.  There are no firearms at home. There is no violence in the home.   There is no risks for hepatitis, STDs or HIV. There is no history of blood transfusion. They have no travel history to infectious disease endemic areas of the world.  The patient has seen their dentist in the last six month [facility]). They have seen their eye doctor in the last year [facility]).  Occasional noted loss of hearing bilaterally. Has hearing aids. Occasional ringing in ears. They have deferred audiologic testing in the last year. Interested in setting up Audiology eval.  They do not  have excessive sun exposure. Discussed the need for sun protection: hats, long sleeves and use of sunscreen if there is significant sun exposure.   Diet: the importance of a healthy diet is discussed. They do have a healthy diet.  The benefits of regular aerobic exercise were discussed. She walks daily either outside or treadmill.  HCPOA - in place.  Depression screen: there are no signs or vegative symptoms of depression- irritability, change in appetite, anhedonia, sadness/tearfullness.  Cognitive assessment: the patient manages all their financial and personal affairs and is actively engaged. They could relate day,date,year and events.  The following portions of the patient's history were reviewed and updated as  appropriate: allergies, current medications, past family history, past medical history,  past surgical history, past social history  and problem list.  Visual acuity was not assessed per patient preference since she has regular follow up with her ophthalmologist. Hearing and body mass index were assessed and reviewed.   During the course of the visit the patient was educated and counseled about appropriate screening and preventive services including : fall prevention , diabetes screening, nutrition counseling, colorectal cancer screening, and recommended immunizations.    Patient requests to have thyroid function checked because of some ongoing mild persistent fatigue. However, she reports that symptoms of fatigue are much improved compared to previous. She has stopped using her CPAP. She would like to set up or repeat sleep study.  Outpatient Encounter Prescriptions as of 02/05/2013  Medication Sig  . Ascorbic Acid (VITAMIN C CR) 1500 MG TBCR Take by mouth daily.  Marland Kitchen aspirin 81 MG tablet Take 81 mg by mouth daily.  . Calcium Carbonate-Vit D-Min (CALCIUM 1200 PO) Take 1 tablet by mouth 2 (two) times daily.  . Cholecalciferol (VITAMIN D-3) 5000 UNITS TABS Take by mouth daily.  Marland Kitchen KRILL OIL PO Take by mouth.  . Lysine HCl 500 MG CAPS Take by mouth daily.  . traZODone (DESYREL) 50 MG tablet Take 0.5-1 tablets (25-50 mg total) by mouth at bedtime as needed for sleep.  Marland Kitchen Zinc 50 MG TABS Take by mouth daily.    BP 150/70  Pulse 74  Temp(Src) 98.2 F (36.8 C) (Oral)  Ht 4' 11.75" (1.518 m)  Wt 125 lb (56.7 kg)  BMI 24.61 kg/m2  SpO2 97%  Review of Systems  Constitutional: Negative for fever, chills, appetite change, fatigue and unexpected weight change.  HENT: Negative for congestion, ear pain, sinus pressure, sore throat, trouble swallowing and voice change.   Eyes: Negative for visual disturbance.  Respiratory: Negative for cough, shortness of breath, wheezing and stridor.   Cardiovascular:  Negative for chest pain, palpitations and leg swelling.  Gastrointestinal: Negative for nausea, vomiting, abdominal pain, diarrhea, constipation, blood in stool, abdominal distention and anal bleeding.  Genitourinary: Negative for dysuria and flank pain.  Musculoskeletal: Negative for arthralgias, gait problem, myalgias and neck pain.  Skin: Negative for color change and rash.  Neurological: Negative for dizziness and headaches.  Hematological: Negative for adenopathy. Does not bruise/bleed easily.  Psychiatric/Behavioral: Negative for suicidal ideas, sleep disturbance and dysphoric mood. The patient is not nervous/anxious.        Objective:   Physical Exam  Constitutional: She is oriented to person, place, and time. She appears well-developed and well-nourished. No distress.  HENT:  Head: Normocephalic and atraumatic.  Right Ear: External ear normal.  Left Ear: External ear normal.  Nose: Nose normal.  Mouth/Throat: Oropharynx is clear and moist. No oropharyngeal exudate.  Eyes: Conjunctivae are normal. Pupils are equal, round, and reactive to light. Right eye exhibits no discharge. Left eye exhibits no discharge. No scleral icterus.  Neck: Normal range of motion. Neck supple. No tracheal deviation present. No thyromegaly present.  Cardiovascular: Normal rate, regular rhythm, normal heart sounds and intact distal pulses.  Exam reveals no gallop and no friction rub.   No murmur heard. Pulmonary/Chest: Effort normal and breath sounds normal. No accessory muscle usage. Not tachypneic. No respiratory distress. She has no decreased breath sounds. She has no wheezes. She has no rhonchi. She has no rales. She exhibits no tenderness. Right breast exhibits no inverted nipple, no mass, no nipple discharge, no skin change and no tenderness. Left breast exhibits no inverted nipple, no mass, no nipple discharge, no skin change and no tenderness. Breasts are symmetrical.  Abdominal: Soft. Bowel sounds  are normal. She exhibits no distension and no mass. There is no tenderness. There is no rebound and no guarding.  Musculoskeletal: Normal range of motion. She exhibits no edema and no tenderness.  Lymphadenopathy:    She has no cervical adenopathy.  Neurological: She is alert and oriented to person, place, and time. No cranial nerve deficit. She exhibits normal muscle tone. Coordination normal.  Skin: Skin is warm and dry. No rash noted. She is not diaphoretic. No erythema. No pallor.  Psychiatric: She has a normal mood and affect. Her behavior is normal. Judgment and thought content normal.          Assessment & Plan:

## 2013-02-05 NOTE — Assessment & Plan Note (Addendum)
General medical exam normal today including breast exam. Health maintenance is up-to-date. Encouraged healthy diet and regular physical activity. Encouraged her to discontinue calcium supplementation and get adequate dietary calcium instead. Discussed the sources of adequate dietary calcium. Immunizations are up to date. Will check labs including CMP, lipids, TSH. Appropriate screening performed.

## 2013-02-06 LAB — VITAMIN D 25 HYDROXY (VIT D DEFICIENCY, FRACTURES): Vit D, 25-Hydroxy: 74 ng/mL (ref 30–89)

## 2013-02-08 ENCOUNTER — Encounter: Payer: Self-pay | Admitting: *Deleted

## 2013-02-08 ENCOUNTER — Telehealth: Payer: Self-pay | Admitting: *Deleted

## 2013-02-08 MED ORDER — ROSUVASTATIN CALCIUM 5 MG PO TABS: 5.0000 mg | ORAL_TABLET | Freq: Every day | ORAL | Status: DC

## 2013-02-08 NOTE — Telephone Encounter (Signed)
Message copied by Theola Sequin on Thu Feb 08, 2013  2:09 PM ------      Message from: Ronna Polio A      Created: Wed Feb 07, 2013  9:09 PM       OK. I reviewed the previous notes. She was on Atorvastatin at that time. Let's have her try a very low dose of Crestor 5mg  daily. #30 with 3 refills. Then, in 4 weeks, let's repeat Lipids and a CMP and total CK. If she develops any muscle pain on the Crestor, she will need to stop the medication. ------

## 2013-02-09 ENCOUNTER — Encounter: Payer: Self-pay | Admitting: Internal Medicine

## 2013-03-06 ENCOUNTER — Ambulatory Visit: Payer: Self-pay | Admitting: Internal Medicine

## 2013-03-06 DIAGNOSIS — G4733 Obstructive sleep apnea (adult) (pediatric): Secondary | ICD-10-CM | POA: Diagnosis not present

## 2013-03-27 ENCOUNTER — Encounter: Payer: Self-pay | Admitting: Internal Medicine

## 2013-04-01 ENCOUNTER — Ambulatory Visit: Payer: Self-pay | Admitting: Internal Medicine

## 2013-04-01 DIAGNOSIS — G4733 Obstructive sleep apnea (adult) (pediatric): Secondary | ICD-10-CM | POA: Diagnosis not present

## 2013-04-07 ENCOUNTER — Encounter: Payer: Self-pay | Admitting: Internal Medicine

## 2013-04-09 ENCOUNTER — Telehealth: Payer: Self-pay | Admitting: Internal Medicine

## 2013-04-09 DIAGNOSIS — G4700 Insomnia, unspecified: Secondary | ICD-10-CM

## 2013-04-09 NOTE — Telephone Encounter (Signed)
Fine to refill 

## 2013-04-09 NOTE — Telephone Encounter (Signed)
Ok to refill 

## 2013-04-09 NOTE — Telephone Encounter (Signed)
traZODone (DESYREL) 50 MG tablet  #90

## 2013-04-10 MED ORDER — TRAZODONE HCL 50 MG PO TABS: 25.0000 mg | ORAL_TABLET | Freq: Every evening | ORAL | Status: DC | PRN

## 2013-04-10 NOTE — Telephone Encounter (Signed)
Prescription sent to pharmacy.

## 2013-04-23 DIAGNOSIS — Z961 Presence of intraocular lens: Secondary | ICD-10-CM | POA: Diagnosis not present

## 2013-05-09 ENCOUNTER — Telehealth: Payer: Self-pay | Admitting: Internal Medicine

## 2013-05-09 NOTE — Telephone Encounter (Signed)
Pt states she received a call from sleep study place that she went to in December.  States she thinks they do not understand what she needs and there is confusion.  She would like Dr. Gilford Rile to call her to discuss. Does not wish to make an appt, feels it will be a waste of time.

## 2013-05-09 NOTE — Telephone Encounter (Signed)
Tamara Burton - Can you help her with this?

## 2013-05-09 NOTE — Telephone Encounter (Signed)
Fwd to Dr. Walker 

## 2013-05-11 NOTE — Telephone Encounter (Signed)
LVM for patient to call our office

## 2013-05-11 NOTE — Telephone Encounter (Signed)
Spoke with Kennyth Lose at [facility] the patient had her CPAP titration, they sent over a script for new equipment and settings to [place]- we singed. Pt has now refused the new equipment from [facility]

## 2013-05-11 NOTE — Telephone Encounter (Signed)
Spoke with patient, she would like her sleep study results. They did not even ask her for download to compare her sleeping to what the results were. I told her they recommended she get some new supplies and settings. Patient is not impressed with [facility] and wants to hold off until she sees you again.

## 2013-05-11 NOTE — Telephone Encounter (Signed)
We can discuss at her visit.

## 2013-05-15 NOTE — Telephone Encounter (Signed)
Left detailed message on patient voicemail, this will be discussed when she comes in for her office visit with Dr. Gilford Rile.

## 2013-05-18 DIAGNOSIS — J019 Acute sinusitis, unspecified: Secondary | ICD-10-CM | POA: Diagnosis not present

## 2013-05-25 ENCOUNTER — Encounter: Payer: Self-pay | Admitting: Internal Medicine

## 2013-05-31 ENCOUNTER — Encounter: Payer: Self-pay | Admitting: Internal Medicine

## 2013-06-04 ENCOUNTER — Ambulatory Visit (INDEPENDENT_AMBULATORY_CARE_PROVIDER_SITE_OTHER): Payer: Medicare Other | Admitting: Internal Medicine

## 2013-06-04 ENCOUNTER — Encounter: Payer: Self-pay | Admitting: Internal Medicine

## 2013-06-04 VITALS — BP 154/70 | HR 85 | Temp 98.6°F | Wt 128.0 lb

## 2013-06-04 DIAGNOSIS — IMO0001 Reserved for inherently not codable concepts without codable children: Secondary | ICD-10-CM | POA: Diagnosis not present

## 2013-06-04 DIAGNOSIS — J0100 Acute maxillary sinusitis, unspecified: Secondary | ICD-10-CM | POA: Diagnosis not present

## 2013-06-04 DIAGNOSIS — E785 Hyperlipidemia, unspecified: Secondary | ICD-10-CM

## 2013-06-04 DIAGNOSIS — G4730 Sleep apnea, unspecified: Secondary | ICD-10-CM

## 2013-06-04 DIAGNOSIS — M791 Myalgia, unspecified site: Secondary | ICD-10-CM

## 2013-06-04 LAB — LIPID PANEL
CHOLESTEROL: 177 mg/dL (ref 0–200)
HDL: 70.2 mg/dL (ref >39.00)
LDL CALC: 90 mg/dL (ref 0–99)
TRIGLYCERIDES: 84 mg/dL (ref 0.0–149.0)
Total CHOL/HDL Ratio: 3
VLDL: 16.8 mg/dL (ref 0.0–40.0)

## 2013-06-04 LAB — COMPREHENSIVE METABOLIC PANEL
ALBUMIN: 4 g/dL (ref 3.5–5.2)
ALK PHOS: 93 U/L (ref 39–117)
ALT: 20 U/L (ref 0–35)
AST: 26 U/L (ref 0–37)
BILIRUBIN TOTAL: 0.9 mg/dL (ref 0.3–1.2)
BUN: 10 mg/dL (ref 6–23)
CO2: 30 meq/L (ref 19–32)
Calcium: 9.3 mg/dL (ref 8.4–10.5)
Chloride: 101 mEq/L (ref 96–112)
Creatinine, Ser: 0.7 mg/dL (ref 0.4–1.2)
GFR: 86.06 mL/min (ref >60.00)
GLUCOSE: 103 mg/dL — AB (ref 70–99)
Potassium: 4.1 mEq/L (ref 3.5–5.1)
Sodium: 138 mEq/L (ref 135–145)
Total Protein: 6.5 g/dL (ref 6.0–8.3)

## 2013-06-04 LAB — CK: Total CK: 64 U/L (ref 7–177)

## 2013-06-04 MED ORDER — DOXYCYCLINE HYCLATE 100 MG PO TABS: 100.0000 mg | ORAL_TABLET | Freq: Two times a day (BID) | ORAL | Status: DC

## 2013-06-04 NOTE — Progress Notes (Signed)
Pre visit review using our clinic review tool, if applicable. No additional management support is needed unless otherwise documented below in the visit note. 

## 2013-06-04 NOTE — Patient Instructions (Signed)

## 2013-06-04 NOTE — Assessment & Plan Note (Addendum)
Chronic myalgia BLE. Exam is normal. Will check total CK with labs today.

## 2013-06-04 NOTE — Progress Notes (Signed)
Subjective:    Patient ID: Tamara Burton, female    DOB: 28-Jul-1933, 80 y.o.   MRN: [id number]  HPI 80YO female with h/o sleep apnea, HL presents for follow up.  Sinus infection - Seen at [facility] urgent care 2/15, diagnosed with sinus infection. Treated with Doxycycline. Felt better initially, then developed sore throat and recurrent nasal congestion, sinus pressure. Started Mucinex-DM with minimal improvement. Notes right ear pain. Yesterday, had subjective fever. Headache last few days. Cough productive of clear phlegm. No chest pain or dyspnea.  Sleep apnea - Recent CPAP titration showed need for CPAP 8. She is using CPAP but unsure pressure setting.  Review of Systems  Constitutional: Positive for fever and fatigue. Negative for chills and unexpected weight change.  HENT: Positive for ear pain, postnasal drip, rhinorrhea and sinus pressure. Negative for congestion, ear discharge, facial swelling, hearing loss, mouth sores, nosebleeds, sneezing, sore throat, tinnitus, trouble swallowing and voice change.   Eyes: Negative for pain, discharge, redness and visual disturbance.  Respiratory: Positive for cough. Negative for chest tightness, shortness of breath, wheezing and stridor.   Cardiovascular: Negative for chest pain, palpitations and leg swelling.  Musculoskeletal: Negative for arthralgias, myalgias, neck pain and neck stiffness.  Skin: Negative for color change and rash.  Neurological: Positive for headaches. Negative for dizziness, weakness and light-headedness.  Hematological: Negative for adenopathy.       Objective:    BP 154/70  Pulse 85  Temp(Src) 98.6 F (37 C) (Oral)  Wt 128 lb (58.06 kg)  SpO2 98% Physical Exam  Constitutional: She is oriented to person, place, and time. She appears well-developed and well-nourished. No distress.  HENT:  Head: Normocephalic and atraumatic.  Right Ear: External ear normal. A middle ear effusion is present.  Left Ear:  External ear normal. A middle ear effusion is present.  Nose: Mucosal edema and rhinorrhea present. Right sinus exhibits maxillary sinus tenderness. Left sinus exhibits maxillary sinus tenderness.  Mouth/Throat: Oropharynx is clear and moist. No oropharyngeal exudate.  Eyes: Conjunctivae are normal. Pupils are equal, round, and reactive to light. Right eye exhibits no discharge. Left eye exhibits no discharge. No scleral icterus.  Neck: Normal range of motion. Neck supple. No tracheal deviation present. No thyromegaly present.  Cardiovascular: Normal rate, regular rhythm, normal heart sounds and intact distal pulses.  Exam reveals no gallop and no friction rub.   No murmur heard. Pulmonary/Chest: Effort normal and breath sounds normal. No accessory muscle usage. Not tachypneic. No respiratory distress. She has no decreased breath sounds. She has no wheezes. She has no rhonchi. She has no rales. She exhibits no tenderness.  Musculoskeletal: Normal range of motion. She exhibits no edema and no tenderness.  Lymphadenopathy:    She has no cervical adenopathy.  Neurological: She is alert and oriented to person, place, and time. No cranial nerve deficit. She exhibits normal muscle tone. Coordination normal.  Skin: Skin is warm and dry. No rash noted. She is not diaphoretic. No erythema. No pallor.  Psychiatric: She has a normal mood and affect. Her behavior is normal. Judgment and thought content normal.          Assessment & Plan:   Problem List Items Addressed This Visit   Acute maxillary sinusitis - Primary     Symptoms and exam consistent with acute maxillary sinusitis. Will start Doxycycline and plan for 10 day course, as this has recently worked well for her. She has had frequent bouts of maxillary sinusitis. Question obstruction  leading to recurrent symptoms. Will set up ENT evaluation for direct visualization.    Relevant Medications      pseudoephedrine-guaifenesin (MUCINEX D) 60-600 MG  per tablet      DOXYCYCLINE HYCLATE 100 MG PO TABS   Other Relevant Orders      Ambulatory referral to ENT   Hyperlipidemia LDL goal < 100   Relevant Orders      Comp Met (CMET) (Completed)      Lipid Profile (Completed)   Myalgia     Chronic myalgia BLE. Exam is normal. Will check total CK with labs today.    Relevant Orders      CK (Creatine Kinase) (Completed)   Sleep apnea     History of sleep apnea. Recent CPAP titration recommended CPAP 8cmH2O. Recommended that she continue with this. She will review home settings and get back to [place]. Also question if sinusitis contributing to OSA. Will set up ENT evaluation.        Return in about 4 weeks (around 07/02/2013) for Recheck.

## 2013-06-04 NOTE — Assessment & Plan Note (Signed)
History of sleep apnea. Recent CPAP titration recommended CPAP 8cmH2O. Recommended that she continue with this. She will review home settings and get back to [place]. Also question if sinusitis contributing to OSA. Will set up ENT evaluation.

## 2013-06-04 NOTE — Assessment & Plan Note (Signed)
Symptoms and exam consistent with acute maxillary sinusitis. Will start Doxycycline and plan for 10 day course, as this has recently worked well for her. She has had frequent bouts of maxillary sinusitis. Question obstruction leading to recurrent symptoms. Will set up ENT evaluation for direct visualization.

## 2013-06-10 ENCOUNTER — Encounter: Payer: Self-pay | Admitting: Internal Medicine

## 2013-06-11 ENCOUNTER — Encounter: Payer: Self-pay | Admitting: Internal Medicine

## 2013-06-11 DIAGNOSIS — J018 Other acute sinusitis: Secondary | ICD-10-CM | POA: Diagnosis not present

## 2013-06-11 DIAGNOSIS — J342 Deviated nasal septum: Secondary | ICD-10-CM | POA: Diagnosis not present

## 2013-06-11 DIAGNOSIS — J301 Allergic rhinitis due to pollen: Secondary | ICD-10-CM | POA: Diagnosis not present

## 2013-06-11 DIAGNOSIS — J343 Hypertrophy of nasal turbinates: Secondary | ICD-10-CM | POA: Diagnosis not present

## 2013-06-11 MED ORDER — ROSUVASTATIN CALCIUM 5 MG PO TABS: 5.0000 mg | ORAL_TABLET | Freq: Every day | ORAL | Status: DC

## 2013-06-12 NOTE — Telephone Encounter (Signed)
The only one that's listed in her chart is the CPAP Titration. Is this what you would like sent?

## 2013-07-02 DIAGNOSIS — J343 Hypertrophy of nasal turbinates: Secondary | ICD-10-CM | POA: Diagnosis not present

## 2013-07-02 DIAGNOSIS — J018 Other acute sinusitis: Secondary | ICD-10-CM | POA: Diagnosis not present

## 2013-07-13 ENCOUNTER — Encounter: Payer: Self-pay | Admitting: Internal Medicine

## 2013-07-13 ENCOUNTER — Telehealth: Payer: Self-pay | Admitting: Internal Medicine

## 2013-07-13 ENCOUNTER — Ambulatory Visit (INDEPENDENT_AMBULATORY_CARE_PROVIDER_SITE_OTHER): Payer: Medicare Other | Admitting: Internal Medicine

## 2013-07-13 VITALS — BP 140/70 | HR 81 | Temp 98.6°F | Wt 127.0 lb

## 2013-07-13 DIAGNOSIS — L719 Rosacea, unspecified: Secondary | ICD-10-CM | POA: Insufficient documentation

## 2013-07-13 DIAGNOSIS — J0100 Acute maxillary sinusitis, unspecified: Secondary | ICD-10-CM

## 2013-07-13 DIAGNOSIS — G4730 Sleep apnea, unspecified: Secondary | ICD-10-CM | POA: Diagnosis not present

## 2013-07-13 NOTE — Telephone Encounter (Signed)
At check-out pt scheduled wellness visit.  Pt wanted to schedule lab a few days prior.  No template in for lab past 11/4.  Pt would like to come 11/10 at 8:00 a.m.  Advised pt I will add her to the schedule one the template is in.

## 2013-07-13 NOTE — Progress Notes (Signed)
Subjective:    Patient ID: Tamara Burton, female    DOB: 07-17-1933, 80 y.o.   MRN: [id number]  HPI 80YO female presents for follow up.  Sinusitis - Treated by ENT (Dr. Tami Ribas) with prednisone and Clindamycin x 1 month . CT sinus showed complete opacification of maxillary sinuses. Symptoms have much improved.  Sleep Apnea - Continues on CPAP. Unsure if settings are correct. Tolerating well.  Rosacea - Notes long history of redness and bumps over face and forehead. Previously on Tetracycline. Would like to talk with dermatologist about alternative treatments.    Review of Systems  Constitutional: Negative for fever, chills, appetite change, fatigue and unexpected weight change.  HENT: Negative for congestion, ear pain, sinus pressure, sore throat, trouble swallowing and voice change.   Eyes: Negative for visual disturbance.  Respiratory: Negative for cough, shortness of breath, wheezing and stridor.   Cardiovascular: Negative for chest pain, palpitations and leg swelling.  Gastrointestinal: Negative for nausea, vomiting, abdominal pain, diarrhea, constipation, blood in stool, abdominal distention and anal bleeding.  Genitourinary: Negative for dysuria and flank pain.  Musculoskeletal: Negative for arthralgias, gait problem, myalgias and neck pain.  Skin: Negative for color change and rash.  Neurological: Negative for dizziness and headaches.  Hematological: Negative for adenopathy. Does not bruise/bleed easily.  Psychiatric/Behavioral: Negative for suicidal ideas, sleep disturbance and dysphoric mood. The patient is not nervous/anxious.        Objective:    BP 140/70  Pulse 81  Temp(Src) 98.6 F (37 C) (Oral)  Wt 127 lb (57.607 kg)  SpO2 97% Physical Exam  Constitutional: She is oriented to person, place, and time. She appears well-developed and well-nourished. No distress.  HENT:  Head: Normocephalic and atraumatic.  Right Ear: External ear normal.  Left Ear: External  ear normal.  Nose: Nose normal.  Mouth/Throat: Oropharynx is clear and moist. No oropharyngeal exudate.  Eyes: Conjunctivae are normal. Pupils are equal, round, and reactive to light. Right eye exhibits no discharge. Left eye exhibits no discharge. No scleral icterus.  Neck: Normal range of motion. Neck supple. No tracheal deviation present. No thyromegaly present.  Cardiovascular: Normal rate, regular rhythm, normal heart sounds and intact distal pulses.  Exam reveals no gallop and no friction rub.   No murmur heard. Pulmonary/Chest: Effort normal and breath sounds normal. No accessory muscle usage. Not tachypneic. No respiratory distress. She has no decreased breath sounds. She has no wheezes. She has no rhonchi. She has no rales. She exhibits no tenderness.  Musculoskeletal: Normal range of motion. She exhibits no edema and no tenderness.  Lymphadenopathy:    She has no cervical adenopathy.  Neurological: She is alert and oriented to person, place, and time. No cranial nerve deficit. She exhibits normal muscle tone. Coordination normal.  Skin: Skin is warm and dry. No rash noted. She is not diaphoretic. No erythema. No pallor.  Psychiatric: She has a normal mood and affect. Her behavior is normal. Judgment and thought content normal.          Assessment & Plan:   Problem List Items Addressed This Visit   Acute maxillary sinusitis     Symptoms have improved after Doxycycline, Clindamycin and Prednisone. Will continue Flonase. Follow up prn.    Relevant Medications      fluticasone (FLONASE) 50 MCG/ACT nasal spray   Rosacea     Pt will follow up with dermatologist regarding chronic rosacea and possible treatment with light therapy.    Sleep apnea - Primary  On CPAP. Concerned about settings on machine. Will set up follow up with Dr. Richardson Landry who read initial study.    Relevant Orders      Ambulatory referral to ENT       Return in about 7 months (around 02/12/2014) for  Wellness Visit.

## 2013-07-13 NOTE — Progress Notes (Signed)
Pre visit review using our clinic review tool, if applicable. No additional management support is needed unless otherwise documented below in the visit note. 

## 2013-07-13 NOTE — Assessment & Plan Note (Signed)
Symptoms have improved after Doxycycline, Clindamycin and Prednisone. Will continue Flonase. Follow up prn.

## 2013-07-13 NOTE — Assessment & Plan Note (Signed)
On CPAP. Concerned about settings on machine. Will set up follow up with Dr. Richardson Landry who read initial study.

## 2013-07-13 NOTE — Assessment & Plan Note (Signed)
Pt will follow up with dermatologist regarding chronic rosacea and possible treatment with light therapy.

## 2013-07-17 DIAGNOSIS — J343 Hypertrophy of nasal turbinates: Secondary | ICD-10-CM | POA: Diagnosis not present

## 2013-07-17 DIAGNOSIS — J018 Other acute sinusitis: Secondary | ICD-10-CM | POA: Diagnosis not present

## 2013-07-18 NOTE — Telephone Encounter (Signed)
Lab scheduled opened and patient is scheduled for 02/12/14 at 8:15.

## 2013-07-20 DIAGNOSIS — J018 Other acute sinusitis: Secondary | ICD-10-CM | POA: Diagnosis not present

## 2013-07-20 DIAGNOSIS — J343 Hypertrophy of nasal turbinates: Secondary | ICD-10-CM | POA: Diagnosis not present

## 2013-07-23 DIAGNOSIS — J301 Allergic rhinitis due to pollen: Secondary | ICD-10-CM | POA: Diagnosis not present

## 2013-08-26 ENCOUNTER — Encounter: Payer: Self-pay | Admitting: Internal Medicine

## 2013-08-29 ENCOUNTER — Ambulatory Visit (INDEPENDENT_AMBULATORY_CARE_PROVIDER_SITE_OTHER): Payer: Medicare Other | Admitting: Adult Health

## 2013-08-29 ENCOUNTER — Encounter: Payer: Self-pay | Admitting: Adult Health

## 2013-08-29 VITALS — BP 138/74 | HR 73 | Temp 98.3°F | Resp 14 | Ht 59.75 in | Wt 128.2 lb

## 2013-08-29 DIAGNOSIS — S80269A Insect bite (nonvenomous), unspecified knee, initial encounter: Secondary | ICD-10-CM

## 2013-08-29 DIAGNOSIS — S90569A Insect bite (nonvenomous), unspecified ankle, initial encounter: Secondary | ICD-10-CM

## 2013-08-29 DIAGNOSIS — W57XXXA Bitten or stung by nonvenomous insect and other nonvenomous arthropods, initial encounter: Principal | ICD-10-CM

## 2013-08-29 NOTE — Progress Notes (Signed)
Pre visit review using our clinic review tool, if applicable. No additional management support is needed unless otherwise documented below in the visit note. 

## 2013-08-29 NOTE — Progress Notes (Signed)
Patient ID: Tamara Burton, female   DOB: Aug 15, 1933, 80 y.o.   MRN: [id number]   Subjective:    Patient ID: Tamara Burton, female    DOB: 1933/06/04, 80 y.o.   MRN: [id number]  HPI  Pt is a pleasant 80 y/o female who presents with concerns about a tick bite. She reports finding a tick on her a few days ago after she showered. It had not been attached or embedded for 36 hours. She reports the tick was not even fully attached. She removed the tick without any problems. She reports redness and itching at the site - lateral left knee.  Past Medical History  Diagnosis Date  . Lung cancer 2003    s/p left lower lobe resection  . Hyperlipidemia   . Orthostatic hypotension   . Arthritis   . Carotid artery disease   . Sleep apnea     on CPAP    Current Outpatient Prescriptions on File Prior to Visit  Medication Sig Dispense Refill  . Ascorbic Acid (VITAMIN C CR) 1500 MG TBCR Take by mouth daily.      Marland Kitchen aspirin 81 MG tablet Take 81 mg by mouth daily.      . Calcium Carbonate-Vit D-Min (CALCIUM 1200 PO) Take 1 tablet by mouth 2 (two) times daily.      . Cholecalciferol (VITAMIN D-3) 5000 UNITS TABS Take by mouth daily.      . fluticasone (FLONASE) 50 MCG/ACT nasal spray Place 1 spray into both nostrils daily.      Marland Kitchen KRILL OIL PO Take by mouth.      . Lysine HCl 500 MG CAPS Take by mouth daily.      . pseudoephedrine-guaifenesin (MUCINEX D) 60-600 MG per tablet Take 1 tablet by mouth every 12 (twelve) hours.      . rosuvastatin (CRESTOR) 5 MG tablet Take 1 tablet (5 mg total) by mouth daily.  90 tablet  1  . traZODone (DESYREL) 50 MG tablet Take 0.5-1 tablets (25-50 mg total) by mouth at bedtime as needed for sleep.  90 tablet  0  . Zinc 50 MG TABS Take by mouth daily.       No current facility-administered medications on file prior to visit.    Review of Systems  Constitutional: Negative.  Negative for fever and chills.  HENT: Negative.   Eyes: Negative.   Respiratory: Negative.     Cardiovascular: Negative.   Gastrointestinal: Negative.   Endocrine: Negative.   Genitourinary: Negative.   Musculoskeletal: Negative.  Negative for arthralgias, joint swelling and myalgias.  Skin: Negative for rash.       Tick bite left lateral knee. She reports having increased redness and itching. She reports it is improved.  Allergic/Immunologic: Negative.   Neurological: Negative.  Negative for headaches.  Hematological: Negative.   Psychiatric/Behavioral: Negative.        Objective:  BP 138/74  Pulse 73  Temp(Src) 98.3 F (36.8 C) (Oral)  Resp 14  Ht 4' 11.75" (1.518 m)  Wt 128 lb 4 oz (58.174 kg)  BMI 25.25 kg/m2  SpO2 97%   Physical Exam  Constitutional: She is oriented to person, place, and time. No distress.  HENT:  Head: Normocephalic and atraumatic.  Eyes: Conjunctivae and EOM are normal.  Neck: Normal range of motion. Neck supple.  Cardiovascular: Normal rate and regular rhythm.   Pulmonary/Chest: Effort normal. No respiratory distress.  Musculoskeletal: Normal range of motion.  Neurological: She is alert and oriented to person, place, and  time.  Skin: Skin is warm and dry. No rash noted.  Minimal redness noted at site of tick bite left lateral knee. No s/s infection. No systemic symptoms.  Psychiatric: She has a normal mood and affect. Her behavior is normal. Judgment and thought content normal.      Assessment & Plan:   1. Tick bite of knee No s/s infection. Applying topical antibiotic to the area.  Call if any questions or concerns.

## 2013-10-07 ENCOUNTER — Encounter: Payer: Self-pay | Admitting: Internal Medicine

## 2013-10-08 ENCOUNTER — Other Ambulatory Visit: Payer: Self-pay | Admitting: *Deleted

## 2013-10-08 MED ORDER — ROSUVASTATIN CALCIUM 5 MG PO TABS: 5.0000 mg | ORAL_TABLET | Freq: Every day | ORAL | Status: DC

## 2013-10-16 DIAGNOSIS — H0019 Chalazion unspecified eye, unspecified eyelid: Secondary | ICD-10-CM | POA: Diagnosis not present

## 2013-11-22 ENCOUNTER — Encounter: Payer: Self-pay | Admitting: Internal Medicine

## 2013-12-24 ENCOUNTER — Ambulatory Visit: Payer: Self-pay | Admitting: Internal Medicine

## 2013-12-24 DIAGNOSIS — I6529 Occlusion and stenosis of unspecified carotid artery: Secondary | ICD-10-CM | POA: Diagnosis not present

## 2013-12-24 DIAGNOSIS — E785 Hyperlipidemia, unspecified: Secondary | ICD-10-CM | POA: Diagnosis not present

## 2013-12-24 DIAGNOSIS — Z1231 Encounter for screening mammogram for malignant neoplasm of breast: Secondary | ICD-10-CM | POA: Diagnosis not present

## 2013-12-24 DIAGNOSIS — I10 Essential (primary) hypertension: Secondary | ICD-10-CM | POA: Diagnosis not present

## 2013-12-24 LAB — HM MAMMOGRAPHY: HM Mammogram: NEGATIVE

## 2013-12-26 ENCOUNTER — Encounter: Payer: Self-pay | Admitting: *Deleted

## 2013-12-29 ENCOUNTER — Ambulatory Visit: Payer: BC Managed Care – PPO

## 2014-01-07 DIAGNOSIS — Z23 Encounter for immunization: Secondary | ICD-10-CM | POA: Diagnosis not present

## 2014-01-15 ENCOUNTER — Encounter: Payer: Self-pay | Admitting: Internal Medicine

## 2014-01-17 DIAGNOSIS — L821 Other seborrheic keratosis: Secondary | ICD-10-CM | POA: Diagnosis not present

## 2014-01-17 DIAGNOSIS — L820 Inflamed seborrheic keratosis: Secondary | ICD-10-CM | POA: Diagnosis not present

## 2014-01-17 DIAGNOSIS — Z1283 Encounter for screening for malignant neoplasm of skin: Secondary | ICD-10-CM | POA: Diagnosis not present

## 2014-01-17 DIAGNOSIS — D229 Melanocytic nevi, unspecified: Secondary | ICD-10-CM | POA: Diagnosis not present

## 2014-01-17 DIAGNOSIS — L603 Nail dystrophy: Secondary | ICD-10-CM | POA: Diagnosis not present

## 2014-01-17 DIAGNOSIS — L720 Epidermal cyst: Secondary | ICD-10-CM | POA: Diagnosis not present

## 2014-01-17 DIAGNOSIS — L814 Other melanin hyperpigmentation: Secondary | ICD-10-CM | POA: Diagnosis not present

## 2014-02-12 ENCOUNTER — Other Ambulatory Visit: Payer: Self-pay | Admitting: *Deleted

## 2014-02-12 ENCOUNTER — Telehealth: Payer: Self-pay | Admitting: *Deleted

## 2014-02-12 ENCOUNTER — Other Ambulatory Visit (INDEPENDENT_AMBULATORY_CARE_PROVIDER_SITE_OTHER): Payer: Medicare Other

## 2014-02-12 DIAGNOSIS — Z0000 Encounter for general adult medical examination without abnormal findings: Secondary | ICD-10-CM

## 2014-02-12 LAB — CBC WITH DIFFERENTIAL/PLATELET
BASOS ABS: 0 10*3/uL (ref 0.0–0.1)
Basophils Relative: 0.6 % (ref 0.0–3.0)
EOS PCT: 1.6 % (ref 0.0–5.0)
Eosinophils Absolute: 0.1 10*3/uL (ref 0.0–0.7)
HEMATOCRIT: 43 % (ref 36.0–46.0)
Hemoglobin: 14 g/dL (ref 12.0–15.0)
LYMPHS ABS: 1.8 10*3/uL (ref 0.7–4.0)
LYMPHS PCT: 26.8 % (ref 12.0–46.0)
MCHC: 32.7 g/dL (ref 30.0–36.0)
MCV: 88.2 fl (ref 78.0–100.0)
MONOS PCT: 6.7 % (ref 3.0–12.0)
Monocytes Absolute: 0.5 10*3/uL (ref 0.1–1.0)
NEUTROS PCT: 64.3 % (ref 43.0–77.0)
Neutro Abs: 4.3 10*3/uL (ref 1.4–7.7)
PLATELETS: 242 10*3/uL (ref 150.0–400.0)
RBC: 4.87 Mil/uL (ref 3.87–5.11)
RDW: 13.2 % (ref 11.5–15.5)
WBC: 6.7 10*3/uL (ref 4.0–10.5)

## 2014-02-12 LAB — COMPREHENSIVE METABOLIC PANEL
ALT: 19 U/L (ref 0–35)
AST: 21 U/L (ref 0–37)
Albumin: 3.7 g/dL (ref 3.5–5.2)
Alkaline Phosphatase: 112 U/L (ref 39–117)
BILIRUBIN TOTAL: 0.5 mg/dL (ref 0.2–1.2)
BUN: 12 mg/dL (ref 6–23)
CALCIUM: 9.3 mg/dL (ref 8.4–10.5)
CHLORIDE: 102 meq/L (ref 96–112)
CO2: 32 meq/L (ref 19–32)
Creatinine, Ser: 0.6 mg/dL (ref 0.4–1.2)
GFR: 113.46 mL/min (ref >60.00)
GLUCOSE: 102 mg/dL — AB (ref 70–99)
Potassium: 4.1 mEq/L (ref 3.5–5.1)
SODIUM: 142 meq/L (ref 135–145)
TOTAL PROTEIN: 6.7 g/dL (ref 6.0–8.3)

## 2014-02-12 LAB — LIPID PANEL
CHOLESTEROL: 198 mg/dL (ref 0–200)
HDL: 65.3 mg/dL (ref >39.00)
LDL Cholesterol: 116 mg/dL — ABNORMAL HIGH (ref 0–99)
NONHDL: 132.7
TRIGLYCERIDES: 86 mg/dL (ref 0.0–149.0)
Total CHOL/HDL Ratio: 3
VLDL: 17.2 mg/dL (ref 0.0–40.0)

## 2014-02-12 LAB — TSH: TSH: 0.97 u[IU]/mL (ref 0.35–4.50)

## 2014-02-12 LAB — HEMOGLOBIN A1C: Hgb A1c MFr Bld: 5.7 % (ref 4.6–6.5)

## 2014-02-12 NOTE — Telephone Encounter (Signed)
If this is for a physical, I would recommend that we wait until the time of her visit to order appropriate labs.

## 2014-02-12 NOTE — Telephone Encounter (Signed)
Is this for a physical?

## 2014-02-12 NOTE — Telephone Encounter (Signed)
Error

## 2014-02-12 NOTE — Telephone Encounter (Signed)
Pt is coming to see you on 11.13.2015

## 2014-02-12 NOTE — Telephone Encounter (Signed)
We can order CMP, CBC, TSH, lipids, A1c for physical.

## 2014-02-12 NOTE — Telephone Encounter (Signed)
What labs and dx?  

## 2014-02-12 NOTE — Telephone Encounter (Signed)
Would you like me to hold on to the tubes? Or throw them out?

## 2014-02-15 ENCOUNTER — Ambulatory Visit (INDEPENDENT_AMBULATORY_CARE_PROVIDER_SITE_OTHER): Payer: Medicare Other | Admitting: Internal Medicine

## 2014-02-15 ENCOUNTER — Ambulatory Visit (INDEPENDENT_AMBULATORY_CARE_PROVIDER_SITE_OTHER)
Admission: RE | Admit: 2014-02-15 | Discharge: 2014-02-15 | Disposition: A | Discharge status: Home / Self Care | Payer: Medicare Other | Source: Ambulatory Visit | Attending: Internal Medicine | Admitting: Internal Medicine

## 2014-02-15 ENCOUNTER — Other Ambulatory Visit: Payer: Self-pay | Admitting: *Deleted

## 2014-02-15 ENCOUNTER — Other Ambulatory Visit: Payer: Self-pay | Admitting: Internal Medicine

## 2014-02-15 ENCOUNTER — Encounter: Payer: Self-pay | Admitting: Internal Medicine

## 2014-02-15 ENCOUNTER — Telehealth: Payer: Self-pay | Admitting: Internal Medicine

## 2014-02-15 VITALS — BP 160/88 | HR 76 | Temp 98.3°F | Ht 60.0 in | Wt 129.8 lb

## 2014-02-15 DIAGNOSIS — Z85118 Personal history of other malignant neoplasm of bronchus and lung: Secondary | ICD-10-CM

## 2014-02-15 DIAGNOSIS — IMO0001 Reserved for inherently not codable concepts without codable children: Secondary | ICD-10-CM

## 2014-02-15 DIAGNOSIS — Z0000 Encounter for general adult medical examination without abnormal findings: Secondary | ICD-10-CM

## 2014-02-15 DIAGNOSIS — Z9889 Other specified postprocedural states: Secondary | ICD-10-CM | POA: Diagnosis not present

## 2014-02-15 DIAGNOSIS — Z23 Encounter for immunization: Secondary | ICD-10-CM

## 2014-02-15 DIAGNOSIS — R030 Elevated blood-pressure reading, without diagnosis of hypertension: Secondary | ICD-10-CM

## 2014-02-15 DIAGNOSIS — R918 Other nonspecific abnormal finding of lung field: Secondary | ICD-10-CM | POA: Diagnosis not present

## 2014-02-15 MED ORDER — AZITHROMYCIN 250 MG PO TABS: ORAL_TABLET | ORAL | Status: DC

## 2014-02-15 MED ORDER — AZITHROMYCIN 250 MG PO TABS
ORAL_TABLET | ORAL | Status: DC
Start: 2014-02-15 — End: 2014-03-11

## 2014-02-15 NOTE — Progress Notes (Signed)
Pre visit review using our clinic review tool, if applicable. No additional management support is needed unless otherwise documented below in the visit note. 

## 2014-02-15 NOTE — Assessment & Plan Note (Signed)
History of lung cancer. Last CXR in 09/2013 was normal, however recently having some issues with dry chronic cough. Most likely, cough related to seasonal allergies. Will get repeat CXR for evaluation.

## 2014-02-15 NOTE — Progress Notes (Signed)
Subjective:    Patient ID: Tamara Burton, female    DOB: July 27, 1933, 80 y.o.   MRN: [id number]  HPI [name] for annual Medicare wellness examination and management of other chronic and acute problems.   The risk factors are reflected in the social history.  The roster of all physicians providing medical care to patient - is listed in the Snapshot section of the chart.  Activities of daily living:  The patient is 100% independent in all ADLs: dressing, toileting, feeding as well as independent mobility. Lives in [address]. No pets in home.  Home safety : The patient has smoke detectors in the home. They wear seatbelts.  There are no firearms at home. There is no violence in the home.   There is no risks for hepatitis, STDs or HIV. There is no history of blood transfusion. They have no travel history to infectious disease endemic areas of the world.  The patient has seen their dentist in the last six month [facility]ighbors). They have seen their eye doctor in the last year [facility]  Occasional noted loss of hearing bilaterally. Has hearing aids. Occasional ringing in ears.  Has audiology check next week.  They do not  have excessive sun exposure. Discussed the need for sun protection: hats, long sleeves and use of sunscreen if there is significant sun exposure.   Diet: the importance of a healthy diet is discussed. They do have a healthy diet.  The benefits of regular aerobic exercise were discussed. She walks daily either outside or treadmill.  HCPOA - in place.  Depression screen: there are no signs or vegative symptoms of depression- irritability, change in appetite, anhedonia, sadness/tearfullness.  Cognitive assessment: the patient manages all their financial and personal affairs and is actively engaged. They could relate day,date,year and events.  The following portions of the patient's history were reviewed and updated as appropriate: allergies, current  medications, past family history, past medical history,  past surgical history, past social history  and problem list.  Visual acuity was not assessed per patient preference since she has regular follow up with her ophthalmologist. Hearing and body mass index were assessed and reviewed.   During the course of the visit the patient was educated and counseled about appropriate screening and preventive services including : fall prevention , diabetes screening, nutrition counseling, colorectal cancer screening, and recommended immunizations.    ACUTE ISSUES: Recently had sinus infection starting in September 2015. Continues to have sinus pressure and headaches. Symptoms made worse by changes in weather. Using OTC antihistamines and decongestants with some improvement.  Continues to have diffuse muscular pain and cramping. This has been ongoing off and on for years. Taking occasional Advil for pain with improvement.  Review of Systems  Constitutional: Positive for fatigue. Negative for fever, chills, appetite change and unexpected weight change.  Eyes: Negative for visual disturbance.  Respiratory: Positive for cough. Negative for shortness of breath.   Cardiovascular: Negative for chest pain and leg swelling.  Gastrointestinal: Negative for nausea, vomiting, abdominal pain, diarrhea, constipation and blood in stool.  Musculoskeletal: Positive for myalgias, back pain and arthralgias.  Skin: Negative for color change and rash.  Hematological: Negative for adenopathy. Does not bruise/bleed easily.  Psychiatric/Behavioral: Negative for dysphoric mood. The patient is not nervous/anxious.        Objective:    BP 160/88 mmHg  Pulse 76  Temp(Src) 98.3 F (36.8 C) (Oral)  Ht 5' (1.524 m)  Wt 129 lb 12  oz (58.854 kg)  BMI 25.34 kg/m2  SpO2 97% Physical Exam  Constitutional: She is oriented to person, place, and time. She appears well-developed and well-nourished. No distress.  HENT:  Head:  Normocephalic and atraumatic.  Right Ear: External ear normal.  Left Ear: External ear normal.  Nose: Nose normal.  Mouth/Throat: Oropharynx is clear and moist. No oropharyngeal exudate.  Eyes: Conjunctivae are normal. Pupils are equal, round, and reactive to light. Right eye exhibits no discharge. Left eye exhibits no discharge. No scleral icterus.  Neck: Normal range of motion. Neck supple. No tracheal deviation present. No thyromegaly present.  Cardiovascular: Normal rate, regular rhythm, normal heart sounds and intact distal pulses.  Exam reveals no gallop and no friction rub.   No murmur heard. Pulmonary/Chest: Effort normal and breath sounds normal. No accessory muscle usage. No tachypnea. No respiratory distress. She has no decreased breath sounds. She has no wheezes. She has no rhonchi. She has no rales. She exhibits no tenderness. Right breast exhibits no inverted nipple, no mass, no nipple discharge, no skin change and no tenderness. Left breast exhibits no inverted nipple, no mass, no nipple discharge, no skin change and no tenderness. Breasts are symmetrical.  Abdominal: Soft. Bowel sounds are normal. She exhibits no distension and no mass. There is no tenderness. There is no rebound and no guarding.  Musculoskeletal: Normal range of motion. She exhibits no edema or tenderness.  Lymphadenopathy:    She has no cervical adenopathy.  Neurological: She is alert and oriented to person, place, and time. No cranial nerve deficit. She exhibits normal muscle tone. Coordination normal.  Skin: Skin is warm and dry. No rash noted. She is not diaphoretic. No erythema. No pallor.  Psychiatric: She has a normal mood and affect. Her behavior is normal. Judgment and thought content normal.          Assessment & Plan:   Problem List Items Addressed This Visit      Unprioritized   Elevated BP    BP Readings from Last 3 Encounters:  02/15/14 160/88  08/29/13 138/74  07/13/13 140/70   BP  elevated today. Recent renal function was normal. Will have her stop OTC sudafed and return for BP recheck in 1 week and follow up in 4 weeks.    Relevant Orders      EKG 12-Lead (Completed)   History of lung cancer    History of lung cancer. Last CXR in 09/2013 was normal, however recently having some issues with dry chronic cough. Most likely, cough related to seasonal allergies. Will get repeat CXR for evaluation.    Relevant Orders      DG Chest 2 View   Medicare annual wellness visit, subsequent - Primary    General medical exam normal today including breast exam. Health maintenance is up-to-date. Encouraged healthy diet and regular physical activity.  Immunizations are up to date except for Prevnar which was given today. Reviewed recent labs with pt. Appropriate screening performed.       Other Visit Diagnoses    Need for vaccination with 13-polyvalent pneumococcal conjugate vaccine        Relevant Orders       Pneumococcal conjugate vaccine 13-valent (Completed)        Return in about 4 weeks (around 03/15/2014) for Recheck of Blood Pressure.

## 2014-02-15 NOTE — Telephone Encounter (Signed)
Called and advised patient of results,  verbalized understanding.

## 2014-02-15 NOTE — Patient Instructions (Addendum)
Stop any over the counter products with Sudafed. Return for recheck of blood pressure in 1 week.  Health Maintenance Adopting a healthy lifestyle and getting preventive care can go a long way to promote health and wellness. Talk with your health care provider about what schedule of regular examinations is right for you. This is a good chance for you to check in with your provider about disease prevention and staying healthy. In between checkups, there are plenty of things you can do on your own. Experts have done a lot of research about which lifestyle changes and preventive measures are most likely to keep you healthy. Ask your health care provider for more information. WEIGHT AND DIET  Eat a healthy diet  Be sure to include plenty of vegetables, fruits, low-fat dairy products, and lean protein.  Do not eat a lot of foods high in solid fats, added sugars, or salt.  Get regular exercise. This is one of the most important things you can do for your health.  Most adults should exercise for at least 150 minutes each week. The exercise should increase your heart rate and make you sweat (moderate-intensity exercise).  Most adults should also do strengthening exercises at least twice a week. This is in addition to the moderate-intensity exercise.  Maintain a healthy weight  Body mass index (BMI) is a measurement that can be used to identify possible weight problems. It estimates body fat based on height and weight. Your health care provider can help determine your BMI and help you achieve or maintain a healthy weight.  For females 44 years of age and older:   A BMI below 18.5 is considered underweight.  A BMI of 18.5 to 24.9 is normal.  A BMI of 25 to 29.9 is considered overweight.  A BMI of 30 and above is considered obese.  Watch levels of cholesterol and blood lipids  You should start having your blood tested for lipids and cholesterol at 78 years of age, then have this test every 5  years.  You may need to have your cholesterol levels checked more often if:  Your lipid or cholesterol levels are high.  You are older than 78 years of age.  You are at high risk for heart disease.  CANCER SCREENING   Lung Cancer  Lung cancer screening is recommended for adults 69-40 years old who are at high risk for lung cancer because of a history of smoking.  A yearly low-dose CT scan of the lungs is recommended for people who:  Currently smoke.  Have quit within the past 15 years.  Have at least a 30-pack-year history of smoking. A pack year is smoking an average of one pack of cigarettes a day for 1 year.  Yearly screening should continue until it has been 15 years since you quit.  Yearly screening should stop if you develop a health problem that would prevent you from having lung cancer treatment.  Breast Cancer  Practice breast self-awareness. This means understanding how your breasts normally appear and feel.  It also means doing regular breast self-exams. Let your health care provider know about any changes, no matter how small.  If you are in your 20s or 30s, you should have a clinical breast exam (CBE) by a health care provider every 1-3 years as part of a regular health exam.  If you are 30 or older, have a CBE every year. Also consider having a breast X-ray (mammogram) every year.  If you have a  family history of breast cancer, talk to your health care provider about genetic screening.  If you are at high risk for breast cancer, talk to your health care provider about having an MRI and a mammogram every year.  Breast cancer gene (BRCA) assessment is recommended for women who have family members with BRCA-related cancers. BRCA-related cancers include:  Breast.  Ovarian.  Tubal.  Peritoneal cancers.  Results of the assessment will determine the need for genetic counseling and BRCA1 and BRCA2 testing. Cervical Cancer Routine pelvic examinations to  screen for cervical cancer are no longer recommended for nonpregnant women who are considered low risk for cancer of the pelvic organs (ovaries, uterus, and vagina) and who do not have symptoms. A pelvic examination may be necessary if you have symptoms including those associated with pelvic infections. Ask your health care provider if a screening pelvic exam is right for you.   The Pap test is the screening test for cervical cancer for women who are considered at risk.  If you had a hysterectomy for a problem that was not cancer or a condition that could lead to cancer, then you no longer need Pap tests.  If you are older than 65 years, and you have had normal Pap tests for the past 10 years, you no longer need to have Pap tests.  If you have had past treatment for cervical cancer or a condition that could lead to cancer, you need Pap tests and screening for cancer for at least 20 years after your treatment.  If you no longer get a Pap test, assess your risk factors if they change (such as having a new sexual partner). This can affect whether you should start being screened again.  Some women have medical problems that increase their chance of getting cervical cancer. If this is the case for you, your health care provider may recommend more frequent screening and Pap tests.  The human papillomavirus (HPV) test is another test that may be used for cervical cancer screening. The HPV test looks for the virus that can cause cell changes in the cervix. The cells collected during the Pap test can be tested for HPV.  The HPV test can be used to screen women 33 years of age and older. Getting tested for HPV can extend the interval between normal Pap tests from three to five years.  An HPV test also should be used to screen women of any age who have unclear Pap test results.  After 78 years of age, women should have HPV testing as often as Pap tests.  Colorectal Cancer  This type of cancer can be  detected and often prevented.  Routine colorectal cancer screening usually begins at 78 years of age and continues through 78 years of age.  Your health care provider may recommend screening at an earlier age if you have risk factors for colon cancer.  Your health care provider may also recommend using home test kits to check for hidden blood in the stool.  A small camera at the end of a tube can be used to examine your colon directly (sigmoidoscopy or colonoscopy). This is done to check for the earliest forms of colorectal cancer.  Routine screening usually begins at age 61.  Direct examination of the colon should be repeated every 5-10 years through 78 years of age. However, you may need to be screened more often if early forms of precancerous polyps or small growths are found. Skin Cancer  Check your skin  from head to toe regularly.  Tell your health care provider about any new moles or changes in moles, especially if there is a change in a mole's shape or color.  Also tell your health care provider if you have a mole that is larger than the size of a pencil eraser.  Always use sunscreen. Apply sunscreen liberally and repeatedly throughout the day.  Protect yourself by wearing long sleeves, pants, a wide-brimmed hat, and sunglasses whenever you are outside. HEART DISEASE, DIABETES, AND HIGH BLOOD PRESSURE   Have your blood pressure checked at least every 1-2 years. High blood pressure causes heart disease and increases the risk of stroke.  If you are between 87 years and 42 years old, ask your health care provider if you should take aspirin to prevent strokes.  Have regular diabetes screenings. This involves taking a blood sample to check your fasting blood sugar level.  If you are at a normal weight and have a low risk for diabetes, have this test once every three years after 78 years of age.  If you are overweight and have a high risk for diabetes, consider being tested at a  younger age or more often. PREVENTING INFECTION  Hepatitis B  If you have a higher risk for hepatitis B, you should be screened for this virus. You are considered at high risk for hepatitis B if:  You were born in a country where hepatitis B is common. Ask your health care provider which countries are considered high risk.  Your parents were born in a high-risk country, and you have not been immunized against hepatitis B (hepatitis B vaccine).  You have HIV or AIDS.  You use needles to inject street drugs.  You live with someone who has hepatitis B.  You have had sex with someone who has hepatitis B.  You get hemodialysis treatment.  You take certain medicines for conditions, including cancer, organ transplantation, and autoimmune conditions. Hepatitis C  Blood testing is recommended for:  Everyone born from [birth year] through 1965.  Anyone with known risk factors for hepatitis C. Sexually transmitted infections (STIs)  You should be screened for sexually transmitted infections (STIs) including gonorrhea and chlamydia if:  You are sexually active and are younger than 78 years of age.  You are older than 78 years of age and your health care provider tells you that you are at risk for this type of infection.  Your sexual activity has changed since you were last screened and you are at an increased risk for chlamydia or gonorrhea. Ask your health care provider if you are at risk.  If you do not have HIV, but are at risk, it may be recommended that you take a prescription medicine daily to prevent HIV infection. This is called pre-exposure prophylaxis (PrEP). You are considered at risk if:  You are sexually active and do not regularly use condoms or know the HIV status of your partner(s).  You take drugs by injection.  You are sexually active with a partner who has HIV. Talk with your health care provider about whether you are at high risk of being infected with HIV. If you choose  to begin PrEP, you should first be tested for HIV. You should then be tested every 3 months for as long as you are taking PrEP.  PREGNANCY   If you are premenopausal and you may become pregnant, ask your health care provider about preconception counseling.  If you may become pregnant, take 400 to  800 micrograms (mcg) of folic acid every day.  If you want to prevent pregnancy, talk to your health care provider about birth control (contraception). OSTEOPOROSIS AND MENOPAUSE   Osteoporosis is a disease in which the bones lose minerals and strength with aging. This can result in serious bone fractures. Your risk for osteoporosis can be identified using a bone density scan.  If you are 13 years of age or older, or if you are at risk for osteoporosis and fractures, ask your health care provider if you should be screened.  Ask your health care provider whether you should take a calcium or vitamin D supplement to lower your risk for osteoporosis.  Menopause may have certain physical symptoms and risks.  Hormone replacement therapy may reduce some of these symptoms and risks. Talk to your health care provider about whether hormone replacement therapy is right for you.  HOME CARE INSTRUCTIONS   Schedule regular health, dental, and eye exams.  Stay current with your immunizations.   Do not use any tobacco products including cigarettes, chewing tobacco, or electronic cigarettes.  If you are pregnant, do not drink alcohol.  If you are breastfeeding, limit how much and how often you drink alcohol.  Limit alcohol intake to no more than 1 drink per day for nonpregnant women. One drink equals 12 ounces of beer, 5 ounces of wine, or 1 ounces of hard liquor.  Do not use street drugs.  Do not share needles.  Ask your health care provider for help if you need support or information about quitting drugs.  Tell your health care provider if you often feel depressed.  Tell your health care  provider if you have ever been abused or do not feel safe at home. Document Released: 10/05/2010 Document Revised: 08/06/2013 Document Reviewed: 02/21/2013 [facility] Patient Information 2015 [place], [place]. This information is not intended to replace advice given to you by your health care provider. Make sure you discuss any questions you have with your health care provider.

## 2014-02-15 NOTE — Assessment & Plan Note (Signed)
BP Readings from Last 3 Encounters:  02/15/14 160/88  08/29/13 138/74  07/13/13 140/70   BP elevated today. Recent renal function was normal. Will have her stop OTC sudafed and return for BP recheck in 1 week and follow up in 4 weeks.

## 2014-02-15 NOTE — Assessment & Plan Note (Signed)
General medical exam normal today including breast exam. Health maintenance is up-to-date. Encouraged healthy diet and regular physical activity.  Immunizations are up to date except for Prevnar which was given today. Reviewed recent labs with pt. Appropriate screening performed.

## 2014-02-15 NOTE — Telephone Encounter (Signed)
Tamara Burton called saying she missed a phone call from someone and she was returning the call. Please call her back if you need to. Thank you.

## 2014-02-15 NOTE — Telephone Encounter (Signed)
Pt came to office, needing Azith[facility] sent to [facility]. Rx sent to pharmacy by escript

## 2014-02-26 ENCOUNTER — Ambulatory Visit (INDEPENDENT_AMBULATORY_CARE_PROVIDER_SITE_OTHER): Payer: Medicare Other | Admitting: *Deleted

## 2014-02-26 ENCOUNTER — Other Ambulatory Visit: Payer: Self-pay | Admitting: *Deleted

## 2014-02-26 DIAGNOSIS — I10 Essential (primary) hypertension: Secondary | ICD-10-CM

## 2014-02-26 LAB — MEASURE BLOOD PRESSURE

## 2014-02-26 MED ORDER — AMLODIPINE BESYLATE 2.5 MG PO TABS: 2.5000 mg | ORAL_TABLET | Freq: Every day | ORAL | Status: DC

## 2014-02-26 NOTE — Progress Notes (Signed)
Patient bp in right arm 168/88 left arm after waiting 5 minutes was 164/84. Patient left list of BP readings.

## 2014-03-05 ENCOUNTER — Ambulatory Visit (INDEPENDENT_AMBULATORY_CARE_PROVIDER_SITE_OTHER): Payer: Medicare Other | Admitting: *Deleted

## 2014-03-05 VITALS — HR 84

## 2014-03-05 DIAGNOSIS — I10 Essential (primary) hypertension: Secondary | ICD-10-CM

## 2014-03-05 NOTE — Progress Notes (Signed)
I would recommend we increase Amlodipine to 5mg  daily and make her a follow up next week 67min

## 2014-03-05 NOTE — Progress Notes (Signed)
Presents for BP check, doing well without complaints. Taking Amlodipine 2.5 mg daily. BP 180/80. Left patient to sit x 5 minutes, recheck 172/78. Pt would like to "get to the bottom of this and not necessarily more medication". Dr. Gilford Rile notified.

## 2014-03-06 ENCOUNTER — Telehealth: Payer: Self-pay | Admitting: Internal Medicine

## 2014-03-06 NOTE — Telephone Encounter (Signed)
Please advise.  Pt is scheduled to see you on 12/18

## 2014-03-06 NOTE — Telephone Encounter (Signed)
Nothing open those days. May need to book with Lorane Gell.

## 2014-03-06 NOTE — Telephone Encounter (Signed)
Yes

## 2014-03-06 NOTE — Telephone Encounter (Signed)
Pt did not get the message yesterday after her BP check that you wanted her to increase Amlodipine to 5mg .  Advised pt of this and asked her to call us tomorrow with her reading. First available apt with [facility] is on Monday, Okay for pt to try medication at 5mg  and schedule her on Monday?

## 2014-03-06 NOTE — Telephone Encounter (Signed)
Pt request appt to be moved sooner. Pt is still having high blood pressures and she stated that she doesn't like the way the new medication makes her feel. Pt request on Monday or Friday appt.msn

## 2014-03-11 ENCOUNTER — Encounter: Payer: Self-pay | Admitting: Nurse Practitioner

## 2014-03-11 ENCOUNTER — Ambulatory Visit (INDEPENDENT_AMBULATORY_CARE_PROVIDER_SITE_OTHER): Payer: Medicare Other | Admitting: Nurse Practitioner

## 2014-03-11 VITALS — BP 140/76 | HR 86 | Resp 14 | Ht 60.0 in | Wt 127.5 lb

## 2014-03-11 DIAGNOSIS — I10 Essential (primary) hypertension: Secondary | ICD-10-CM | POA: Diagnosis not present

## 2014-03-11 DIAGNOSIS — R6889 Other general symptoms and signs: Secondary | ICD-10-CM | POA: Diagnosis not present

## 2014-03-11 MED ORDER — HYDROCHLOROTHIAZIDE 12.5 MG PO TABS: 12.5000 mg | ORAL_TABLET | Freq: Every day | ORAL | Status: DC

## 2014-03-11 NOTE — Progress Notes (Signed)
Subjective:    Patient ID: Tamara Burton, female    DOB: March 17, 1934, 80 y.o.   MRN: [id number]  HPI Ms. Uhde is a 80 yo female with a CC of HTN. She is escorted by friend and nurse.   1) HTN-  Patient take BP at home and records these along with pulses. Range: 149/69 to 185/83 pulse 63- 100 Average: (from 21 readings) 164/74 pulse 76 Start with amlodipine 2.5 mg then recently upped to 5 mg after calling office (12/5). She is having symptoms of slurred speech in morning feels groggy (tried without trazadone to make sure), decrease in energy, cramps in lower calves and foot. Denies leg swelling at this time. She is having decreased exercise tolerance, but denies chest pain and SOB (left lower lobe removed of lung from CA). Trazadone at night  No cardiologist on board. Had stress tests in past, no recent echos  Carotids- no change after 3 years  Diet- Cooks, canned soup occasionally, Does not add salt Exercise- Walking 7 days a week 25 min.  No recent decongestants  Only once this month she has worn her CPAP because of the strap on her head causing soreness.  Review of Systems  Constitutional: Positive for fatigue.  Respiratory: Negative for chest tightness and shortness of breath.   Cardiovascular: Positive for palpitations. Negative for chest pain and leg swelling.  Gastrointestinal: Negative for nausea, vomiting and diarrhea.  Musculoskeletal: Negative for joint swelling.  Skin: Negative for rash.  Neurological: Positive for light-headedness. Negative for syncope and headaches.   Past Medical History  Diagnosis Date  . Lung cancer 2003    s/p left lower lobe resection  . Hyperlipidemia   . Orthostatic hypotension   . Arthritis   . Carotid artery disease   . Sleep apnea     on CPAP    History   Social History  . Marital Status: Widowed    Spouse Name: N/A    Number of Children: N/A  . Years of Education: N/A   Occupational History  . Not on file.   Social  History Main Topics  . Smoking status: Never Smoker   . Smokeless tobacco: Not on file  . Alcohol Use: No  . Drug Use: No  . Sexual Activity: Not on file     Comment: Father and husband both smokers   Other Topics Concern  . Not on file   Social History Narrative   Lives in [place]. Daughter nearby.    Past Surgical History  Procedure Laterality Date  . Appendectomy    . Hip arthroplasty      right  . Replacement total knee      left  . Abdominal hysterectomy    . Bilateral oophorectomy    . Vaginal delivery      2  . Cataract extraction, bilateral      Family History  Problem Relation Age of Onset  . Parkinsonism Mother   . Sarcoidosis Brother   . Stroke Maternal Grandmother   . Cancer Paternal Grandmother     Allergies  Allergen Reactions  . Atorvastatin   . Penicillins   . Sulfa Antibiotics     Current Outpatient Prescriptions on File Prior to Visit  Medication Sig Dispense Refill  . amLODipine (NORVASC) 2.5 MG tablet Take 1 tablet (2.5 mg total) by mouth daily. 30 tablet 1  . Ascorbic Acid (VITAMIN C CR) 1500 MG TBCR Take by mouth daily.    Marland Kitchen aspirin 81 MG tablet  Take 81 mg by mouth daily.    . B Complex Vitamins (VITAMIN B COMPLEX PO) Take by mouth.    . Calcium Carbonate-Vit D-Min (CALCIUM 1200 PO) Take 1 tablet by mouth 2 (two) times daily.    . Cholecalciferol (VITAMIN D-3) 5000 UNITS TABS Take by mouth daily.    . Fish Oil OIL by Does not apply route.    . fluticasone (FLONASE) 50 MCG/ACT nasal spray Place 1 spray into both nostrils daily as needed.     Marland Kitchen Lysine HCl 500 MG CAPS Take by mouth daily.    . rosuvastatin (CRESTOR) 5 MG tablet Take 1 tablet (5 mg total) by mouth daily. 90 tablet 1  . traZODone (DESYREL) 50 MG tablet Take 0.5-1 tablets (25-50 mg total) by mouth at bedtime as needed for sleep. 90 tablet 0  . Zinc 50 MG TABS Take by mouth daily.     No current facility-administered medications on file prior to visit.          Objective:   Physical Exam  Constitutional: She is oriented to person, place, and time. She appears well-developed and well-nourished. No distress.  HENT:  Head: Normocephalic and atraumatic.  Cardiovascular: Normal rate and regular rhythm.   Pulmonary/Chest: Effort normal and breath sounds normal. No respiratory distress. She has no wheezes. She has no rales. She exhibits no tenderness.  Neurological: She is alert and oriented to person, place, and time.  Skin: Skin is warm and dry. No rash noted. She is not diaphoretic.  Psychiatric: She has a normal mood and affect. Her behavior is normal. Judgment and thought content normal.     BP 140/76 mmHg  Pulse 86  Resp 14  Ht 5' (1.524 m)  Wt 127 lb 8 oz (57.834 kg)  BMI 24.90 kg/m2  SpO2 97% Repeat in left arm 160/80     Assessment & Plan:

## 2014-03-11 NOTE — Assessment & Plan Note (Addendum)
Decreased Amlodipine back to 2.5 mg daily. Added Rx for HCTZ 12.5 mg daily. Discussed importance of wearing CPAP every night. This will improve sleep quality and decrease stress on heart. Pt verbalized understanding and will look into getting a nose mask instead of face mask. Fu with Dr. Gilford Rile on 18th of this month.

## 2014-03-11 NOTE — Patient Instructions (Signed)
Take only 2.5 mg of the amlodipine.  Add 12.5 mg of the HCTZ once daily. Try to wear CPAP and with different modifications. We will contact you with your referral to Cardiology information shortly.

## 2014-03-11 NOTE — Progress Notes (Signed)
Pre visit review using our clinic review tool, if applicable. No additional management support is needed unless otherwise documented below in the visit note. 

## 2014-03-11 NOTE — Assessment & Plan Note (Signed)
Decrease in ability to exercise. Feels like she reaches her max sooner than previously. Has not had a cardiologist in [place]. Refer to Cardio for evaluation.

## 2014-03-12 ENCOUNTER — Telehealth: Payer: Self-pay | Admitting: Internal Medicine

## 2014-03-12 NOTE — Telephone Encounter (Signed)
emmi emailed °

## 2014-03-18 ENCOUNTER — Other Ambulatory Visit: Payer: Self-pay | Admitting: Internal Medicine

## 2014-03-19 ENCOUNTER — Other Ambulatory Visit: Payer: Self-pay | Admitting: *Deleted

## 2014-03-19 ENCOUNTER — Telehealth: Payer: Self-pay

## 2014-03-19 DIAGNOSIS — G4700 Insomnia, unspecified: Secondary | ICD-10-CM

## 2014-03-19 MED ORDER — TRAZODONE HCL 50 MG PO TABS: 25.0000 mg | ORAL_TABLET | Freq: Every evening | ORAL | Status: DC | PRN

## 2014-03-19 NOTE — Telephone Encounter (Signed)
The patient [facility]  She states [facility].  However, she [facility]. (90 day supply)

## 2014-03-20 ENCOUNTER — Other Ambulatory Visit: Payer: Self-pay | Admitting: *Deleted

## 2014-03-20 DIAGNOSIS — G4700 Insomnia, unspecified: Secondary | ICD-10-CM

## 2014-03-20 MED ORDER — TRAZODONE HCL 50 MG PO TABS: 25.0000 mg | ORAL_TABLET | Freq: Every evening | ORAL | Status: DC | PRN

## 2014-03-20 NOTE — Telephone Encounter (Signed)
[facility]

## 2014-03-20 NOTE — Telephone Encounter (Signed)
The patient called hoping to get an update on if this medication situation was resolved. Thanks!

## 2014-03-22 ENCOUNTER — Encounter: Payer: Self-pay | Admitting: Internal Medicine

## 2014-03-22 ENCOUNTER — Ambulatory Visit (INDEPENDENT_AMBULATORY_CARE_PROVIDER_SITE_OTHER): Payer: Medicare Other | Admitting: Internal Medicine

## 2014-03-22 VITALS — BP 169/75 | HR 73 | Temp 98.1°F | Ht 60.0 in | Wt 126.8 lb

## 2014-03-22 DIAGNOSIS — G4730 Sleep apnea, unspecified: Secondary | ICD-10-CM | POA: Diagnosis not present

## 2014-03-22 DIAGNOSIS — R002 Palpitations: Secondary | ICD-10-CM

## 2014-03-22 DIAGNOSIS — I10 Essential (primary) hypertension: Secondary | ICD-10-CM | POA: Diagnosis not present

## 2014-03-22 MED ORDER — CARVEDILOL 6.25 MG PO TABS: 6.2500 mg | ORAL_TABLET | Freq: Two times a day (BID) | ORAL | Status: DC

## 2014-03-22 NOTE — Progress Notes (Signed)
Pre visit review using our clinic review tool, if applicable. No additional management support is needed unless otherwise documented below in the visit note. 

## 2014-03-22 NOTE — Progress Notes (Signed)
Subjective:    Patient ID: Tamara Burton, female    DOB: [birth year], 80 y.o.   MRN: [id number]  HPI 80YO female presents for follow up.  Seen 12/7 by NP and started on HCTZ for better BP control. Cardiology evaluation pending 12/28 for decreased exercise tolerance.  This morning had episode of heart palpitations with rapid heart rate, felt lightheaded.  No chest pain. Feels tired during episodes. No dyspnea. Episode lasted for about one hours with heart racing only for a few min. Having episodes every few weeks. [place] for years.  Recently placed on Amlodipine for high BP, but felt very drowsy on this medication. HCTZ was added at last visit. BP has been 130-170s/80-90s mostly at home.   Past medical, surgical, family and social history per today's encounter.  Review of Systems  Constitutional: Positive for fatigue. Negative for fever, chills, appetite change and unexpected weight change.  Eyes: Negative for visual disturbance.  Respiratory: Negative for shortness of breath.   Cardiovascular: Positive for palpitations. Negative for chest pain and leg swelling.  Gastrointestinal: Negative for nausea, vomiting, abdominal pain, diarrhea and constipation.  Skin: Negative for color change and rash.  Neurological: Positive for light-headedness.  Hematological: Negative for adenopathy. Does not bruise/bleed easily.  Psychiatric/Behavioral: Negative for dysphoric mood. The patient is not nervous/anxious.        Objective:    BP 169/75 mmHg  Pulse 73  Temp(Src) 98.1 F (36.7 C) (Oral)  Ht 5' (1.524 m)  Wt 126 lb 12 oz (57.493 kg)  BMI 24.75 kg/m2  SpO2 98% Physical Exam  Constitutional: She is oriented to person, place, and time. She appears well-developed and well-nourished. No distress.  HENT:  Head: Normocephalic and atraumatic.  Right Ear: External ear normal.  Left Ear: External ear normal.  Nose: Nose normal.  Mouth/Throat: Oropharynx is clear and moist. No  oropharyngeal exudate.  Eyes: Conjunctivae are normal. Pupils are equal, round, and reactive to light. Right eye exhibits no discharge. Left eye exhibits no discharge. No scleral icterus.  Neck: Normal range of motion. Neck supple. No tracheal deviation present. No thyromegaly present.  Cardiovascular: Normal rate, regular rhythm, normal heart sounds and intact distal pulses.  Exam reveals no gallop and no friction rub.   No murmur heard. Pulmonary/Chest: Effort normal and breath sounds normal. No accessory muscle usage. No tachypnea. No respiratory distress. She has no decreased breath sounds. She has no wheezes. She has no rhonchi. She has no rales. She exhibits no tenderness.  Musculoskeletal: Normal range of motion. She exhibits no edema or tenderness.  Lymphadenopathy:    She has no cervical adenopathy.  Neurological: She is alert and oriented to person, place, and time. No cranial nerve deficit. She exhibits normal muscle tone. Coordination normal.  Skin: Skin is warm and dry. No rash noted. She is not diaphoretic. No erythema. No pallor.  Psychiatric: She has a normal mood and affect. Her behavior is normal. Judgment and thought content normal.          Assessment & Plan:   Problem List Items Addressed This Visit      Unprioritized   Essential hypertension, benign - Primary    BP Readings from Last 3 Encounters:  03/22/14 169/75  03/11/14 140/76  02/26/14 168/88   BP continues to be slightly elevated. Will stop Amlodipine and HCTZ and change to carvedilol for better control of BP and palpitations. Follow up with Dr. Fletcher Anon 12/28 and here in 4 weeks.    Relevant Medications  carvedilol (COREG) tablet   Palpitations    Recent episodes of palpitations and lightheadedness. Occuring typically 1x every 2 weeks. Cardiology evaluation pending. Question if Holter might be helpful. EKG today shows NSR. Will start Carvedilol 6.25mg  po bid. Follow up here in 4 weeks or sooner as  needed.    Relevant Orders      EKG 12-Lead (Completed)   Sleep apnea       Return in about 4 weeks (around 04/19/2014) for Recheck.

## 2014-03-22 NOTE — Assessment & Plan Note (Signed)
Recent episodes of palpitations and lightheadedness. Occuring typically 1x every 2 weeks. Cardiology evaluation pending. Question if Holter might be helpful. EKG today shows NSR. Will start Carvedilol 6.25mg  po bid. Follow up here in 4 weeks or sooner as needed.

## 2014-03-22 NOTE — Assessment & Plan Note (Signed)
BP Readings from Last 3 Encounters:  03/22/14 169/75  03/11/14 140/76  02/26/14 168/88   BP continues to be slightly elevated. Will stop Amlodipine and HCTZ and change to carvedilol for better control of BP and palpitations. Follow up with Dr. Fletcher Anon 12/28 and here in 4 weeks.

## 2014-03-22 NOTE — Patient Instructions (Signed)
Stop Amlodipine and HCTZ.  Start Carvedilol 6.25mg  twice daily.  Follow up with Dr. Fletcher Anon as scheduled 12/28.

## 2014-04-01 ENCOUNTER — Encounter: Payer: Self-pay | Admitting: Cardiovascular Disease

## 2014-04-01 ENCOUNTER — Ambulatory Visit (INDEPENDENT_AMBULATORY_CARE_PROVIDER_SITE_OTHER): Payer: Medicare Other | Admitting: Cardiovascular Disease

## 2014-04-01 VITALS — BP 199/80 | HR 66 | Ht 61.0 in | Wt 129.2 lb

## 2014-04-01 DIAGNOSIS — R002 Palpitations: Secondary | ICD-10-CM

## 2014-04-01 DIAGNOSIS — R0602 Shortness of breath: Secondary | ICD-10-CM | POA: Diagnosis not present

## 2014-04-01 DIAGNOSIS — R0789 Other chest pain: Secondary | ICD-10-CM

## 2014-04-01 DIAGNOSIS — I10 Essential (primary) hypertension: Secondary | ICD-10-CM | POA: Diagnosis not present

## 2014-04-01 MED ORDER — LOSARTAN POTASSIUM 50 MG PO TABS: 50.0000 mg | ORAL_TABLET | Freq: Every day | ORAL | Status: DC

## 2014-04-01 NOTE — Progress Notes (Signed)
Primary care physician: Dr. [name]  HPI  This is a 80 year old female who was referred for evaluation of hypertension and elevated blood pressure. She reports no previous cardiac history. She has known history of sleep apnea, carotid artery stenosis and hyperlipidemia . She was diagnosed with hypertension in November and blood pressure has been difficult to control. She was initially treated with amlodipine and hydrochlorothiazide. However, she started complaining of palpitations associated with an anxiety feeling. Thus, she was switched from both these medications to carvedilol. She reports resolution of palpitations since then. However, blood pressure continues to be elevated. She has been complaining of worsening exertional dyspnea with no chest discomfort. She had routine labs performed in November and were unremarkable. She has no family history of coronary artery disease. She is not a smoker and drinks small amount of alcohol.    Allergies  Allergen Reactions  . Atorvastatin   . Penicillins   . Sulfa Antibiotics      Current Outpatient Prescriptions on File Prior to Visit  Medication Sig Dispense Refill  . Ascorbic Acid (VITAMIN C CR) 1500 MG TBCR Take by mouth daily.    Marland Kitchen aspirin 81 MG tablet Take 81 mg by mouth daily.    . B Complex Vitamins (VITAMIN B COMPLEX PO) Take by mouth.    . Calcium Carbonate-Vit D-Min (CALCIUM 1200 PO) Take 1 tablet by mouth 2 (two) times daily.    . carvedilol (COREG) 6.25 MG tablet Take 1 tablet (6.25 mg total) by mouth 2 (two) times daily with a meal. 60 tablet 3  . Fish Oil OIL by Does not apply route.    . fluticasone (FLONASE) 50 MCG/ACT nasal spray Place 1 spray into both nostrils daily as needed.     Marland Kitchen Lysine HCl 500 MG CAPS Take by mouth daily.    . rosuvastatin (CRESTOR) 5 MG tablet Take 1 tablet (5 mg total) by mouth daily. 90 tablet 1  . traZODone (DESYREL) 50 MG tablet Take 0.5-1 tablets (25-50 mg total) by mouth at bedtime as  needed for sleep. 90 tablet 0  . Zinc 50 MG TABS Take by mouth daily.     No current facility-administered medications on file prior to visit.     Past Medical History  Diagnosis Date  . Lung cancer 2003    s/p left lower lobe resection  . Hyperlipidemia   . Orthostatic hypotension   . Arthritis   . Carotid artery disease   . Sleep apnea     on CPAP     Past Surgical History  Procedure Laterality Date  . Appendectomy    . Hip arthroplasty      right  . Replacement total knee      left  . Abdominal hysterectomy    . Bilateral oophorectomy    . Vaginal delivery      2  . Cataract extraction, bilateral       Family History  Problem Relation Age of Onset  . Parkinsonism Mother   . Sarcoidosis Brother   . Stroke Maternal Grandmother   . Cancer Paternal Grandmother      History   Social History  . Marital Status: Widowed    Spouse Name: N/A    Number of Children: N/A  . Years of Education: N/A   Occupational History  . Not on file.   Social History Main Topics  . Smoking status: Never Smoker   . Smokeless tobacco: Not on file  . Alcohol Use: Yes  Comment: occasional  . Drug Use: No  . Sexual Activity: Not on file     Comment: Father and husband both smokers   Other Topics Concern  . Not on file   Social History Narrative   Lives in [place]. Daughter nearby.     ROS A 10 point review of system was performed. It is negative other than that mentioned in the history of present illness.   PHYSICAL EXAM   BP 199/80 mmHg  Pulse 66  Ht 5\' 1"  (1.549 m)  Wt 129 lb 4 oz (58.627 kg)  BMI 24.43 kg/m2 Constitutional: She is oriented to person, place, and time. She appears well-developed and well-nourished. No distress.  HENT: No nasal discharge.  Head: Normocephalic and atraumatic.  Eyes: Pupils are equal and round. No discharge.  Neck: Normal range of motion. Neck supple. No JVD present. No thyromegaly present.  Cardiovascular: Normal rate,  regular rhythm, normal heart sounds. Exam reveals no gallop and no friction rub. No murmur heard.  Pulmonary/Chest: Effort normal and breath sounds normal. No stridor. No respiratory distress. She has no wheezes. She has no rales. She exhibits no tenderness.  Abdominal: Soft. Bowel sounds are normal. She exhibits no distension. There is no tenderness. There is no rebound and no guarding.  Musculoskeletal: Normal range of motion. She exhibits no edema and no tenderness.  Neurological: She is alert and oriented to person, place, and time. Coordination normal.  Skin: Skin is warm and dry. No rash noted. She is not diaphoretic. No erythema. No pallor.  Psychiatric: She has a normal mood and affect. Her behavior is normal. Judgment and thought content normal.     CNO:BSJGG  Rhythm  Voltage criteria for LVH  (R(I)+S(III) exceeds 2.50 mV)  -Voltage criteria w/o ST/T abnormality may be normal.   -Poor R-wave progression -nonspecific -consider old anterior infarct.   BORDERLINE   ASSESSMENT AND PLAN

## 2014-04-01 NOTE — Assessment & Plan Note (Signed)
She reports worsening exertional dyspnea with uncontrolled hypertension. Thus, I requested an echocardiogram for evaluation.

## 2014-04-01 NOTE — Assessment & Plan Note (Signed)
The patient reports resolution of palpitations since Tamara Burton was started on carvedilol. Tamara Burton is in normal sinus rhythm today. Thus, the yield of a Holter monitor is low at the present time. If Tamara Burton develops recurrent palpitations, we can consider this.

## 2014-04-01 NOTE — Patient Instructions (Signed)
Your physician has requested that you have a renal artery duplex. During this test, an ultrasound is used to evaluate blood flow to the kidneys. Allow one hour for this exam. Do not eat after midnight the day before and avoid carbonated beverages. Take your medications as you usually do.  Your physician has requested that you have an echocardiogram. Echocardiography is a painless test that uses sound waves to create images of your heart. It provides your doctor with information about the size and shape of your heart and how well your heart's chambers and valves are working. This procedure takes approximately one hour. There are no restrictions for this procedure.  Your physician has recommended you make the following change in your medication:  Start Losartan 50 mg once daily   Your physician recommends that you return for lab work in:  In one week for BMP (can do dame day as echo and renal artery duplex)  Your physician recommends that you schedule a follow-up appointment in:  1 month with Dr. Fletcher Anon

## 2014-04-01 NOTE — Assessment & Plan Note (Signed)
Given the late onset of hypertension and difficulty in controlling blood pressure, I think we need to exclude secondary hypertension especially renal artery stenosis in her age group. Thus, I requested a renal artery duplex. In the meantime, I added losartan 50 mg once daily. Check basic metabolic profile in one week. Continue carvedilol.

## 2014-04-02 ENCOUNTER — Inpatient Hospital Stay: Payer: Self-pay | Admitting: Internal Medicine

## 2014-04-02 ENCOUNTER — Telehealth: Payer: Self-pay | Admitting: Cardiovascular Disease

## 2014-04-02 DIAGNOSIS — N183 Chronic kidney disease, stage 3 (moderate): Secondary | ICD-10-CM | POA: Diagnosis not present

## 2014-04-02 DIAGNOSIS — R000 Tachycardia, unspecified: Secondary | ICD-10-CM | POA: Diagnosis not present

## 2014-04-02 DIAGNOSIS — R0600 Dyspnea, unspecified: Secondary | ICD-10-CM | POA: Diagnosis not present

## 2014-04-02 DIAGNOSIS — R0602 Shortness of breath: Secondary | ICD-10-CM | POA: Diagnosis not present

## 2014-04-02 DIAGNOSIS — I10 Essential (primary) hypertension: Secondary | ICD-10-CM | POA: Diagnosis not present

## 2014-04-02 DIAGNOSIS — Z7982 Long term (current) use of aspirin: Secondary | ICD-10-CM | POA: Diagnosis not present

## 2014-04-02 DIAGNOSIS — N189 Chronic kidney disease, unspecified: Secondary | ICD-10-CM | POA: Diagnosis present

## 2014-04-02 DIAGNOSIS — Z79899 Other long term (current) drug therapy: Secondary | ICD-10-CM | POA: Diagnosis not present

## 2014-04-02 DIAGNOSIS — E876 Hypokalemia: Secondary | ICD-10-CM | POA: Diagnosis not present

## 2014-04-02 DIAGNOSIS — Z880 Allergy status to penicillin: Secondary | ICD-10-CM | POA: Diagnosis not present

## 2014-04-02 DIAGNOSIS — Z85118 Personal history of other malignant neoplasm of bronchus and lung: Secondary | ICD-10-CM | POA: Diagnosis not present

## 2014-04-02 DIAGNOSIS — I6529 Occlusion and stenosis of unspecified carotid artery: Secondary | ICD-10-CM | POA: Diagnosis present

## 2014-04-02 DIAGNOSIS — I674 Hypertensive encephalopathy: Secondary | ICD-10-CM | POA: Diagnosis not present

## 2014-04-02 DIAGNOSIS — R531 Weakness: Secondary | ICD-10-CM | POA: Diagnosis not present

## 2014-04-02 DIAGNOSIS — Z820 Family history of epilepsy and other diseases of the nervous system: Secondary | ICD-10-CM | POA: Diagnosis not present

## 2014-04-02 DIAGNOSIS — I340 Nonrheumatic mitral (valve) insufficiency: Secondary | ICD-10-CM | POA: Diagnosis not present

## 2014-04-02 DIAGNOSIS — Z882 Allergy status to sulfonamides status: Secondary | ICD-10-CM | POA: Diagnosis not present

## 2014-04-02 DIAGNOSIS — G4733 Obstructive sleep apnea (adult) (pediatric): Secondary | ICD-10-CM | POA: Diagnosis not present

## 2014-04-02 DIAGNOSIS — I517 Cardiomegaly: Secondary | ICD-10-CM | POA: Diagnosis not present

## 2014-04-02 DIAGNOSIS — R51 Headache: Secondary | ICD-10-CM | POA: Diagnosis not present

## 2014-04-02 DIAGNOSIS — J329 Chronic sinusitis, unspecified: Secondary | ICD-10-CM | POA: Diagnosis not present

## 2014-04-02 LAB — BASIC METABOLIC PANEL
Anion Gap: 7 (ref 7–16)
BUN: 10 mg/dL (ref 7–18)
CALCIUM: 8.9 mg/dL (ref 8.5–10.1)
CHLORIDE: 104 mmol/L (ref 98–107)
Co2: 32 mmol/L (ref 21–32)
Creatinine: 0.7 mg/dL (ref 0.60–1.30)
EGFR (Non-African Amer.): >60
GLUCOSE: 108 mg/dL — AB (ref 65–99)
Osmolality: 285 (ref 275–301)
Potassium: 3.3 mmol/L — ABNORMAL LOW (ref 3.5–5.1)
Sodium: 143 mmol/L (ref 136–145)

## 2014-04-02 LAB — CBC
HCT: 45.3 % (ref 35.0–47.0)
HGB: 14.9 g/dL (ref 12.0–16.0)
MCH: 29.2 pg (ref 26.0–34.0)
MCHC: 33 g/dL (ref 32.0–36.0)
MCV: 88 fL (ref 80–100)
Platelet: 219 10*3/uL (ref 150–440)
RBC: 5.12 10*6/uL (ref 3.80–5.20)
RDW: 13.5 % (ref 11.5–14.5)
WBC: 6.6 10*3/uL (ref 3.6–11.0)

## 2014-04-02 LAB — TROPONIN I: Troponin-I: <0.02 ng/mL

## 2014-04-02 NOTE — Telephone Encounter (Signed)
Patients POA called and stated patients blood pressure today is 200/94 She denies any other symptoms other than feeling unusually tired  She started Losartan 50 mg yesterday and took her coreg this morning  POA took her to the ED  She is being seen there now  She just wanted to let Dr. Fletcher Anon know

## 2014-04-02 NOTE — Telephone Encounter (Signed)
POA is calling stating   Started losartan  BP is even worst 180 to 190 until yesterday   200/94 about an hour ago nothing going on she's clam  she's feeling weak.   Overall feeling bad. POA fiance is taking her to the ER. Just letting us know.

## 2014-04-03 ENCOUNTER — Telehealth: Payer: Self-pay

## 2014-04-03 DIAGNOSIS — I10 Essential (primary) hypertension: Secondary | ICD-10-CM

## 2014-04-03 DIAGNOSIS — N183 Chronic kidney disease, stage 3 (moderate): Secondary | ICD-10-CM | POA: Diagnosis not present

## 2014-04-03 DIAGNOSIS — E876 Hypokalemia: Secondary | ICD-10-CM | POA: Diagnosis not present

## 2014-04-03 DIAGNOSIS — I340 Nonrheumatic mitral (valve) insufficiency: Secondary | ICD-10-CM

## 2014-04-03 DIAGNOSIS — I674 Hypertensive encephalopathy: Secondary | ICD-10-CM | POA: Diagnosis not present

## 2014-04-03 DIAGNOSIS — R51 Headache: Secondary | ICD-10-CM

## 2014-04-03 LAB — BASIC METABOLIC PANEL
ANION GAP: 7 (ref 7–16)
BUN: 18 mg/dL (ref 7–18)
CHLORIDE: 106 mmol/L (ref 98–107)
CREATININE: 0.79 mg/dL (ref 0.60–1.30)
Calcium, Total: 8.7 mg/dL (ref 8.5–10.1)
Co2: 30 mmol/L (ref 21–32)
EGFR (African American): >60
EGFR (Non-African Amer.): >60
GLUCOSE: 92 mg/dL (ref 65–99)
Osmolality: 287 (ref 275–301)
POTASSIUM: 3.7 mmol/L (ref 3.5–5.1)
SODIUM: 143 mmol/L (ref 136–145)

## 2014-04-03 LAB — LIPID PANEL
Cholesterol: 177 mg/dL (ref 0–200)
HDL Cholesterol: 62 mg/dL — ABNORMAL HIGH (ref 40–60)
Ldl Cholesterol, Calc: 87 mg/dL (ref 0–100)
Triglycerides: 138 mg/dL (ref 0–200)
VLDL Cholesterol, Calc: 28 mg/dL (ref 5–40)

## 2014-04-03 LAB — TSH: Thyroid Stimulating Horm: 1.11 u[IU]/mL

## 2014-04-03 LAB — HEMOGLOBIN A1C: Hemoglobin A1C: 5.6 % (ref 4.2–6.3)

## 2014-04-03 NOTE — Telephone Encounter (Signed)
What was she admitted for? We can add her Wednesday at 1pm

## 2014-04-03 NOTE — Telephone Encounter (Signed)
Patient is being discharged today  Patients POA is confused that there were no medication changes  She is worried her blood pressure will trend back up

## 2014-04-03 NOTE — Telephone Encounter (Signed)
The patient was admitted to [facility], is being discharged today (12/30).  Please advise on when and with who you want this patient to follow up with.   Callback - [phone]

## 2014-04-04 ENCOUNTER — Ambulatory Visit (INDEPENDENT_AMBULATORY_CARE_PROVIDER_SITE_OTHER): Payer: Medicare Other | Admitting: Cardiovascular Disease

## 2014-04-04 ENCOUNTER — Encounter: Payer: Self-pay | Admitting: Cardiovascular Disease

## 2014-04-04 VITALS — BP 190/92 | HR 71 | Ht 61.0 in | Wt 128.8 lb

## 2014-04-04 DIAGNOSIS — I10 Essential (primary) hypertension: Secondary | ICD-10-CM

## 2014-04-04 DIAGNOSIS — R002 Palpitations: Secondary | ICD-10-CM

## 2014-04-04 MED ORDER — HYDROCHLOROTHIAZIDE 12.5 MG PO CAPS: 12.5000 mg | ORAL_CAPSULE | Freq: Every day | ORAL | Status: DC

## 2014-04-04 NOTE — Assessment & Plan Note (Signed)
Resolved after treatment with carvedilol.

## 2014-04-04 NOTE — Telephone Encounter (Signed)
Spoke w/ pt's POA, Annie.  Advised her of Dr. Tyrell Antonio recommendation.  She reports that pt's BP is better today, but she was frustrated that discharge nurse was unclear on pt's meds. She states that pt has an appt today to see Dr. Fletcher Anon and is appreciative of the call.

## 2014-04-04 NOTE — Assessment & Plan Note (Signed)
The patient appears to have had high blood pressure readings throughout the last 2 years. Echocardiogram also showed moderate left ventricular hypertrophy. Thus, I think she had hypertension for a while. However, it became significantly uncontrolled over the last 2 months. She is already scheduled for renal artery duplex next month. She is currently on carvedilol 6.25 mg twice daily and losartan 50 mg twice daily. I added hydrochlorothiazide 12.5 mg once daily. The plan is to combine this with losartan once her blood pressure is controlled.

## 2014-04-04 NOTE — Telephone Encounter (Signed)
Pt was seen for hypertension, Pt seen Dr Fletcher Anon today and is scheduled for some test at their office in [place].  Scheduled pt a follow up in feb after appt with Dr Fletcher Anon

## 2014-04-04 NOTE — Telephone Encounter (Signed)
Yes there was change in medications. Losartan was increased to 50 mg bid instead of 25 mg once daily.

## 2014-04-04 NOTE — Progress Notes (Signed)
Primary care physician: Dr. [name]  HPI  This is a 80 year old female who is here today for a follow-up visit regarding hypertension . She reports no previous cardiac history. She has known history of sleep apnea, carotid artery stenosis and hyperlipidemia . She was diagnosed with hypertension in November and blood pressure has been difficult to control. She was initially treated with amlodipine and hydrochlorothiazide. However, she started complaining of palpitations associated with an anxiety feeling. Thus, she was switched from both these medications to carvedilol. She reports resolution of palpitations since then. However, blood pressure continued to be elevated. I added losartan 50 mg once daily. However, blood pressure went up to above 200 and she was extremely anxious with headache and dizziness. Thus, she went to the emergency room at [facility] and was hospitalized for one day. She had an echocardiogram done which showed normal LV systolic function with moderate left ventricular hypertrophy and otherwise no significant valvular abnormalities. The dose of losartan was increased to 50 mg twice daily. Blood pressure was fine this morning but increased again.   Allergies  Allergen Reactions  . Atorvastatin   . Penicillins   . Sulfa Antibiotics      Current Outpatient Prescriptions on File Prior to Visit  Medication Sig Dispense Refill  . Ascorbic Acid (VITAMIN C CR) 1500 MG TBCR Take by mouth daily.    Marland Kitchen aspirin 81 MG tablet Take 81 mg by mouth daily.    . B Complex Vitamins (VITAMIN B COMPLEX PO) Take by mouth.    . Calcium Carbonate-Vit D-Min (CALCIUM 1200 PO) Take 1 tablet by mouth 2 (two) times daily.    . carvedilol (COREG) 6.25 MG tablet Take 1 tablet (6.25 mg total) by mouth 2 (two) times daily with a meal. 60 tablet 3  . Fish Oil OIL by Does not apply route.    . fluticasone (FLONASE) 50 MCG/ACT nasal spray Place 1 spray into both nostrils daily as needed.     Marland Kitchen losartan  (COZAAR) 50 MG tablet Take 1 tablet (50 mg total) by mouth daily. 30 tablet 6  . Lysine HCl 500 MG CAPS Take by mouth daily.    . rosuvastatin (CRESTOR) 5 MG tablet Take 1 tablet (5 mg total) by mouth daily. 90 tablet 1  . traZODone (DESYREL) 50 MG tablet Take 0.5-1 tablets (25-50 mg total) by mouth at bedtime as needed for sleep. 90 tablet 0  . Zinc 50 MG TABS Take by mouth daily.     No current facility-administered medications on file prior to visit.     Past Medical History  Diagnosis Date  . Lung cancer 2003    s/p left lower lobe resection  . Hyperlipidemia   . Orthostatic hypotension   . Arthritis   . Carotid artery disease   . Sleep apnea     on CPAP     Past Surgical History  Procedure Laterality Date  . Appendectomy    . Hip arthroplasty      right  . Replacement total knee      left  . Abdominal hysterectomy    . Bilateral oophorectomy    . Vaginal delivery      2  . Cataract extraction, bilateral       Family History  Problem Relation Age of Onset  . Parkinsonism Mother   . Sarcoidosis Brother   . Stroke Maternal Grandmother   . Cancer Paternal Grandmother      History   Social History  .  Marital Status: Widowed    Spouse Name: N/A    Number of Children: N/A  . Years of Education: N/A   Occupational History  . Not on file.   Social History Main Topics  . Smoking status: Never Smoker   . Smokeless tobacco: Not on file  . Alcohol Use: Yes     Comment: occasional  . Drug Use: No  . Sexual Activity: Not on file     Comment: Father and husband both smokers   Other Topics Concern  . Not on file   Social History Narrative   Lives in [place]. Daughter nearby.     ROS A 10 point review of system was performed. It is negative other than that mentioned in the history of present illness.   PHYSICAL EXAM   BP 190/92 mmHg  Pulse 71  Ht 5\' 1"  (1.549 m)  Wt 128 lb 12 oz (58.401 kg)  BMI 24.34 kg/m2 Constitutional: She is oriented  to person, place, and time. She appears well-developed and well-nourished. No distress.  HENT: No nasal discharge.  Head: Normocephalic and atraumatic.  Eyes: Pupils are equal and round. No discharge.  Neck: Normal range of motion. Neck supple. No JVD present. No thyromegaly present.  Cardiovascular: Normal rate, regular rhythm, normal heart sounds. Exam reveals no gallop and no friction rub. No murmur heard.  Pulmonary/Chest: Effort normal and breath sounds normal. No stridor. No respiratory distress. She has no wheezes. She has no rales. She exhibits no tenderness.  Abdominal: Soft. Bowel sounds are normal. She exhibits no distension. There is no tenderness. There is no rebound and no guarding.  Musculoskeletal: Normal range of motion. She exhibits no edema and no tenderness.  Neurological: She is alert and oriented to person, place, and time. Coordination normal.  Skin: Skin is warm and dry. No rash noted. She is not diaphoretic. No erythema. No pallor.  Psychiatric: She has a normal mood and affect. Her behavior is normal. Judgment and thought content normal.      ASSESSMENT AND PLAN

## 2014-04-04 NOTE — Patient Instructions (Signed)
Add Hydrochlorothiazide 12.5 mg once daily.   Continue other medications.   Follow up in 1 month.

## 2014-04-07 ENCOUNTER — Telehealth: Payer: Self-pay | Admitting: Physician Assistant

## 2014-04-07 NOTE — Telephone Encounter (Signed)
Called as patient has persistent HTN, SBP 200s. She has difficult to control BP, seen by Dr. Fletcher Anon, her losartan was increased to 50mg  BID and HCTZ 12.5mg  added. She is also on coreg 6.25 BID. I have instructed the daughter to given additional dose of 6.25mg  and HCTZ 12.5mg  now and increase her coreg to 12.5 mg BID and HCTZ to 25mg  daily.  If she continue to have SBP >200 despite additional BP meds, she need to seek medical attention at ED.  Hilbert Corrigan PA Pager: [phone]

## 2014-04-15 ENCOUNTER — Telehealth: Payer: Self-pay | Admitting: Cardiovascular Disease

## 2014-04-15 MED ORDER — HYDRALAZINE HCL 25 MG PO TABS: 25.0000 mg | ORAL_TABLET | Freq: Three times a day (TID) | ORAL | Status: DC

## 2014-04-15 NOTE — Telephone Encounter (Signed)
About  week ago, Mung,Howe saw patient and changed up pt meds. Now POA is calling stating that  bp is still the same.    [address] 170 Afternoons 190 Evenings  Pt is not feeling well. Please call back. For patient is not normally like this. Please call patient.

## 2014-04-15 NOTE — Telephone Encounter (Signed)
Discussed patient issue with Dr. Fletcher Anon  Per Dr. Fletcher Anon reschedule renal artery duplex for this week  Start patient on hydralazine 25 mg three times daily  PAtient caregiver verbalized understanding

## 2014-04-18 ENCOUNTER — Emergency Department (HOSPITAL_COMMUNITY): Payer: Medicare HMO

## 2014-04-18 ENCOUNTER — Encounter (HOSPITAL_COMMUNITY): Payer: Self-pay | Admitting: Emergency Medicine

## 2014-04-18 ENCOUNTER — Emergency Department (HOSPITAL_COMMUNITY)
Admission: EM | Admit: 2014-04-18 | Discharge: 2014-04-18 | Disposition: A | Discharge status: Home / Self Care | Payer: Medicare HMO | Attending: Emergency Medicine | Admitting: Emergency Medicine

## 2014-04-18 DIAGNOSIS — M1990 Unspecified osteoarthritis, unspecified site: Secondary | ICD-10-CM | POA: Insufficient documentation

## 2014-04-18 DIAGNOSIS — M25519 Pain in unspecified shoulder: Secondary | ICD-10-CM

## 2014-04-18 DIAGNOSIS — M47816 Spondylosis without myelopathy or radiculopathy, lumbar region: Secondary | ICD-10-CM | POA: Insufficient documentation

## 2014-04-18 DIAGNOSIS — M5184 Other intervertebral disc disorders, thoracic region: Secondary | ICD-10-CM | POA: Diagnosis not present

## 2014-04-18 DIAGNOSIS — Z85118 Personal history of other malignant neoplasm of bronchus and lung: Secondary | ICD-10-CM | POA: Insufficient documentation

## 2014-04-18 DIAGNOSIS — Z9981 Dependence on supplemental oxygen: Secondary | ICD-10-CM | POA: Insufficient documentation

## 2014-04-18 DIAGNOSIS — Z79899 Other long term (current) drug therapy: Secondary | ICD-10-CM | POA: Diagnosis not present

## 2014-04-18 DIAGNOSIS — R079 Chest pain, unspecified: Secondary | ICD-10-CM

## 2014-04-18 DIAGNOSIS — M25512 Pain in left shoulder: Secondary | ICD-10-CM | POA: Insufficient documentation

## 2014-04-18 DIAGNOSIS — Z7951 Long term (current) use of inhaled steroids: Secondary | ICD-10-CM | POA: Insufficient documentation

## 2014-04-18 DIAGNOSIS — Z880 Allergy status to penicillin: Secondary | ICD-10-CM | POA: Diagnosis not present

## 2014-04-18 DIAGNOSIS — Z7982 Long term (current) use of aspirin: Secondary | ICD-10-CM | POA: Diagnosis not present

## 2014-04-18 DIAGNOSIS — M47814 Spondylosis without myelopathy or radiculopathy, thoracic region: Secondary | ICD-10-CM

## 2014-04-18 DIAGNOSIS — G4730 Sleep apnea, unspecified: Secondary | ICD-10-CM | POA: Insufficient documentation

## 2014-04-18 DIAGNOSIS — M79602 Pain in left arm: Secondary | ICD-10-CM | POA: Diagnosis present

## 2014-04-18 DIAGNOSIS — I10 Essential (primary) hypertension: Secondary | ICD-10-CM

## 2014-04-18 DIAGNOSIS — M549 Dorsalgia, unspecified: Secondary | ICD-10-CM

## 2014-04-18 DIAGNOSIS — E785 Hyperlipidemia, unspecified: Secondary | ICD-10-CM | POA: Diagnosis not present

## 2014-04-18 LAB — CBC WITH DIFFERENTIAL/PLATELET
Basophils Absolute: 0 10*3/uL (ref 0.0–0.1)
Basophils Relative: 1 % (ref 0–1)
EOS PCT: 2 % (ref 0–5)
Eosinophils Absolute: 0.1 10*3/uL (ref 0.0–0.7)
HCT: 41.4 % (ref 36.0–46.0)
HEMOGLOBIN: 14.1 g/dL (ref 12.0–15.0)
LYMPHS PCT: 32 % (ref 12–46)
Lymphs Abs: 2.1 10*3/uL (ref 0.7–4.0)
MCH: 28.8 pg (ref 26.0–34.0)
MCHC: 34.1 g/dL (ref 30.0–36.0)
MCV: 84.7 fL (ref 78.0–100.0)
MONO ABS: 0.5 10*3/uL (ref 0.1–1.0)
Monocytes Relative: 8 % (ref 3–12)
NEUTROS ABS: 3.9 10*3/uL (ref 1.7–7.7)
Neutrophils Relative %: 57 % (ref 43–77)
PLATELETS: 227 10*3/uL (ref 150–400)
RBC: 4.89 MIL/uL (ref 3.87–5.11)
RDW: 12.8 % (ref 11.5–15.5)
WBC: 6.6 10*3/uL (ref 4.0–10.5)

## 2014-04-18 LAB — COMPREHENSIVE METABOLIC PANEL
ALT: 15 U/L (ref 0–35)
AST: 23 U/L (ref 0–37)
Albumin: 3.7 g/dL (ref 3.5–5.2)
Alkaline Phosphatase: 103 U/L (ref 39–117)
Anion gap: 8 (ref 5–15)
BUN: 15 mg/dL (ref 6–23)
CO2: 31 mmol/L (ref 19–32)
Calcium: 9.6 mg/dL (ref 8.4–10.5)
Chloride: 102 mEq/L (ref 96–112)
Creatinine, Ser: 0.93 mg/dL (ref 0.50–1.10)
GFR calc Af Amer: 66 mL/min — ABNORMAL LOW (ref >90)
GFR calc non Af Amer: 57 mL/min — ABNORMAL LOW (ref >90)
Glucose, Bld: 115 mg/dL — ABNORMAL HIGH (ref 70–99)
POTASSIUM: 3.4 mmol/L — AB (ref 3.5–5.1)
Sodium: 141 mmol/L (ref 135–145)
TOTAL PROTEIN: 6.7 g/dL (ref 6.0–8.3)
Total Bilirubin: 0.6 mg/dL (ref 0.3–1.2)

## 2014-04-18 LAB — I-STAT TROPONIN, ED: Troponin i, poc: 0 ng/mL (ref 0.00–0.08)

## 2014-04-18 MED ORDER — IOHEXOL 350 MG/ML SOLN
100.0000 mL | Freq: Once | INTRAVENOUS | Status: AC | PRN
  Administered 2014-04-18: 100 mL via INTRAVENOUS

## 2014-04-18 NOTE — ED Notes (Signed)
Pt. woke up this evening with left upper back pain / left arm pain , headache and bilateral lower back pain .  Denies SOB , no nausea or diaphoresis .

## 2014-04-18 NOTE — Discharge Instructions (Signed)
Take Tylenol, and use heat on the sore areas 3 or 4 times a day. Take your blood pressure medications, as directed, and follow up with your PCP and Cardiologist as planned, and as needed.    Arthralgia Your caregiver has diagnosed you as suffering from an arthralgia. Arthralgia means there is pain in a joint. This can come from many reasons including:  Bruising the joint which causes soreness (inflammation) in the joint.  Wear and tear on the joints which occur as we grow older (osteoarthritis).  Overusing the joint.  Various forms of arthritis.  Infections of the joint. Regardless of the cause of pain in your joint, most of these different pains respond to anti-inflammatory drugs and rest. The exception to this is when a joint is infected, and these cases are treated with antibiotics, if it is a bacterial infection. HOME CARE INSTRUCTIONS   Rest the injured area for as long as directed by your caregiver. Then slowly start using the joint as directed by your caregiver and as the pain allows. Crutches as directed may be useful if the ankles, knees or hips are involved. If the knee was splinted or casted, continue use and care as directed. If an stretchy or elastic wrapping bandage has been applied today, it should be removed and re-applied every 3 to 4 hours. It should not be applied tightly, but firmly enough to keep swelling down. Watch toes and feet for swelling, bluish discoloration, coldness, numbness or excessive pain. If any of these problems (symptoms) occur, remove the ace bandage and re-apply more loosely. If these symptoms persist, contact your caregiver or return to this location.  For the first 24 hours, keep the injured extremity elevated on pillows while lying down.  Apply ice for 15-20 minutes to the sore joint every couple hours while awake for the first half day. Then 03-04 times per day for the first 48 hours. Put the ice in a plastic bag and place a towel between the bag of  ice and your skin.  Wear any splinting, casting, elastic bandage applications, or slings as instructed.  Only take over-the-counter or prescription medicines for pain, discomfort, or fever as directed by your caregiver. Do not use aspirin immediately after the injury unless instructed by your physician. Aspirin can cause increased bleeding and bruising of the tissues.  If you were given crutches, continue to use them as instructed and do not resume weight bearing on the sore joint until instructed. Persistent pain and inability to use the sore joint as directed for more than 2 to 3 days are warning signs indicating that you should see a caregiver for a follow-up visit as soon as possible. Initially, a hairline fracture (break in bone) may not be evident on X-rays. Persistent pain and swelling indicate that further evaluation, non-weight bearing or use of the joint (use of crutches or slings as instructed), or further X-rays are indicated. X-rays may sometimes not show a small fracture until a week or 10 days later. Make a follow-up appointment with your own caregiver or one to whom we have referred you. A radiologist (specialist in reading X-rays) may read your X-rays. Make sure you know how you are to obtain your X-ray results. Do not assume everything is normal if you do not hear from [place]. SEEK MEDICAL CARE IF: Bruising, swelling, or pain increases. SEEK IMMEDIATE MEDICAL CARE IF:   Your fingers or toes are numb or blue.  The pain is not responding to medications and continues to  stay the same or get worse.  The pain in your joint becomes severe.  You develop a fever over 102 F (38.9 C).  It becomes impossible to move or use the joint. MAKE SURE YOU:   Understand these instructions.  Will watch your condition.  Will get help right away if you are not doing well or get worse. Document Released: 03/22/2005 Document Revised: 06/14/2011 Document Reviewed: 11/08/2007 [facility] Patient  Information 2015 [place], [place]. This information is not intended to replace advice given to you by your health care provider. Make sure you discuss any questions you have with your health care provider.

## 2014-04-19 ENCOUNTER — Encounter (INDEPENDENT_AMBULATORY_CARE_PROVIDER_SITE_OTHER): Payer: Medicare HMO

## 2014-04-19 ENCOUNTER — Encounter: Payer: Self-pay | Admitting: *Deleted

## 2014-04-19 ENCOUNTER — Other Ambulatory Visit: Payer: Self-pay

## 2014-04-19 ENCOUNTER — Telehealth: Payer: Self-pay | Admitting: *Deleted

## 2014-04-19 ENCOUNTER — Ambulatory Visit: Payer: Self-pay | Admitting: Cardiovascular Disease

## 2014-04-19 ENCOUNTER — Telehealth: Payer: Self-pay | Admitting: Cardiovascular Disease

## 2014-04-19 DIAGNOSIS — I10 Essential (primary) hypertension: Secondary | ICD-10-CM

## 2014-04-19 DIAGNOSIS — R0789 Other chest pain: Secondary | ICD-10-CM

## 2014-04-19 DIAGNOSIS — Z01812 Encounter for preprocedural laboratory examination: Secondary | ICD-10-CM

## 2014-04-19 DIAGNOSIS — I701 Atherosclerosis of renal artery: Secondary | ICD-10-CM

## 2014-04-19 LAB — CBC WITH DIFFERENTIAL/PLATELET
Basophil #: 0 10*3/uL (ref 0.0–0.1)
Basophil %: 0.6 %
Eosinophil #: 0.1 10*3/uL (ref 0.0–0.7)
Eosinophil %: 0.8 %
HCT: 42.6 % (ref 35.0–47.0)
HGB: 13.9 g/dL (ref 12.0–16.0)
LYMPHS ABS: 1.7 10*3/uL (ref 1.0–3.6)
Lymphocyte %: 25.4 %
MCH: 28.7 pg (ref 26.0–34.0)
MCHC: 32.6 g/dL (ref 32.0–36.0)
MCV: 88 fL (ref 80–100)
MONOS PCT: 7.4 %
Monocyte #: 0.5 x10 3/mm (ref 0.2–0.9)
Neutrophil #: 4.5 10*3/uL (ref 1.4–6.5)
Neutrophil %: 65.8 %
Platelet: 223 10*3/uL (ref 150–440)
RBC: 4.85 10*6/uL (ref 3.80–5.20)
RDW: 12.9 % (ref 11.5–14.5)
WBC: 6.8 10*3/uL (ref 3.6–11.0)

## 2014-04-19 LAB — BASIC METABOLIC PANEL
Anion Gap: 7 (ref 7–16)
BUN: 18 mg/dL (ref 7–18)
CHLORIDE: 100 mmol/L (ref 98–107)
Calcium, Total: 9 mg/dL (ref 8.5–10.1)
Co2: 31 mmol/L (ref 21–32)
Creatinine: 0.91 mg/dL (ref 0.60–1.30)
EGFR (African American): >60
GLUCOSE: 99 mg/dL (ref 65–99)
Osmolality: 278 (ref 275–301)
POTASSIUM: 3.6 mmol/L (ref 3.5–5.1)
SODIUM: 138 mmol/L (ref 136–145)

## 2014-04-19 LAB — PROTIME-INR
INR: 0.9
PROTHROMBIN TIME: 11.9 s (ref 11.5–14.7)

## 2014-04-19 MED ORDER — PANTOPRAZOLE SODIUM 40 MG PO TBEC: 40.0000 mg | DELAYED_RELEASE_TABLET | Freq: Every day | ORAL | Status: DC

## 2014-04-19 MED ORDER — HYDRALAZINE HCL 50 MG PO TABS: 50.0000 mg | ORAL_TABLET | Freq: Three times a day (TID) | ORAL | Status: DC

## 2014-04-19 NOTE — ED Provider Notes (Signed)
CSN: [id number]     Arrival date & time 04/18/14  8119 History   First MD Initiated Contact with Patient 04/18/14 0441     Chief Complaint  Patient presents with  . Arm Pain  . Back Pain     (Consider location/radiation/quality/duration/timing/severity/associated sxs/prior Treatment) Patient is a 81 y.o. female presenting with arm pain and back pain. The history is provided by the patient and a friend.  Arm Pain  Back Pain  Tamara Burton is a 81 y.o. female who is here primarily for upper and lower back pain with some pain in her left arm.  Her friend who is with her, is also concerned that her blood pressure has been poorly controlled.  She is currently taking 4 different medications for hypertension.  And had been recent modifications in her treatment plan.  She is following closely with her cardiologist.  She has an upcoming renal ultrasound to evaluate for renal stenosis as a cause of her distant hypertension.  She denies fever, chills, cough, shortness of breath, chest pain, weakness or dizziness.  She does not have chronic neck or back pain.  She is taking her usual medications.  There are no other known modifying factors.    Past Medical History  Diagnosis Date  . Lung cancer 2003    s/p left lower lobe resection  . Hyperlipidemia   . Orthostatic hypotension   . Arthritis   . Carotid artery disease   . Sleep apnea     on CPAP   Past Surgical History  Procedure Laterality Date  . Appendectomy    . Hip arthroplasty      right  . Replacement total knee      left  . Abdominal hysterectomy    . Bilateral oophorectomy    . Vaginal delivery      2  . Cataract extraction, bilateral     Family History  Problem Relation Age of Onset  . Parkinsonism Mother   . Sarcoidosis Brother   . Stroke Maternal Grandmother   . Cancer Paternal Grandmother    History  Substance Use Topics  . Smoking status: Never Smoker   . Smokeless tobacco: Not on file  . Alcohol Use: Yes      Comment: occasional   OB History    No data available     Review of Systems  Musculoskeletal: Positive for back pain.  All other systems reviewed and are negative.     Allergies  Atorvastatin; Penicillins; and Sulfa antibiotics  Home Medications   Prior to Admission medications   Medication Sig Start Date End Date Taking? Authorizing Provider  Ascorbic Acid (VITAMIN C CR) 1500 MG TBCR Take by mouth daily.   Yes Historical Provider, MD  aspirin 81 MG tablet Take 81 mg by mouth daily.   Yes Historical Provider, MD  B Complex Vitamins (VITAMIN B COMPLEX PO) Take by mouth.   Yes Historical Provider, MD  Cholecalciferol 2000 UNITS TABS Take 1 tablet by mouth daily.   Yes Historical Provider, MD  Fish Oil OIL by Does not apply route.   Yes Historical Provider, MD  fluticasone (FLONASE) 50 MCG/ACT nasal spray Place 1 spray into both nostrils daily as needed.    Yes Historical Provider, MD  hydrALAZINE (APRESOLINE) 25 MG tablet Take 1 tablet (25 mg total) by mouth 3 (three) times daily. 04/15/14  Yes Wellington Hampshire, MD  losartan (COZAAR) 50 MG tablet Take 1 tablet (50 mg total) by mouth daily. Patient taking  differently: Take 50 mg by mouth 2 (two) times daily.  04/01/14  Yes Wellington Hampshire, MD  Lysine HCl 500 MG CAPS Take 1,000 mg by mouth daily as needed (for cold sores).    Yes Historical Provider, MD  rosuvastatin (CRESTOR) 5 MG tablet Take 1 tablet (5 mg total) by mouth daily. 10/08/13  Yes Jackolyn Confer, MD  traZODone (DESYREL) 50 MG tablet Take 0.5-1 tablets (25-50 mg total) by mouth at bedtime as needed for sleep. 03/20/14  Yes Jackolyn Confer, MD  Zinc 50 MG TABS Take by mouth daily.   Yes Historical Provider, MD  carvedilol (COREG) 6.25 MG tablet Take 1 tablet (6.25 mg total) by mouth 2 (two) times daily with a meal. Patient not taking: Reported on 04/18/2014 03/22/14   Jackolyn Confer, MD  hydrochlorothiazide (MICROZIDE) 12.5 MG capsule Take 1 capsule (12.5 mg total) by  mouth daily. Patient not taking: Reported on 04/18/2014 04/04/14   Wellington Hampshire, MD   BP 123/55 mmHg  Pulse 65  Temp(Src) 98.3 F (36.8 C) (Oral)  Resp 15  Ht 5\' 1"  (1.549 m)  Wt 124 lb (56.246 kg)  BMI 23.44 kg/m2  SpO2 99% Physical Exam  Constitutional: She is oriented to person, place, and time. She appears well-developed.  Frail, elderly  HENT:  Head: Normocephalic and atraumatic.  Right Ear: External ear normal.  Left Ear: External ear normal.  Eyes: Conjunctivae and EOM are normal. Pupils are equal, round, and reactive to light.  Neck: Normal range of motion and phonation normal. Neck supple.  Cardiovascular: Normal rate, regular rhythm and normal heart sounds.   Pulmonary/Chest: Effort normal and breath sounds normal. She exhibits no bony tenderness.  Abdominal: Soft. There is no tenderness.  Musculoskeletal: Normal range of motion.  Normal range of motion, neck and back.  No palpable tenderness of the cervical, thoracic or lumbar spines.  Normal range of motion, arms and legs, bilaterally.  Neurological: She is alert and oriented to person, place, and time. No cranial nerve deficit or sensory deficit. She exhibits normal muscle tone. Coordination normal.  Skin: Skin is warm, dry and intact.  Psychiatric: She has a normal mood and affect. Her behavior is normal. Judgment and thought content normal.  Nursing note and vitals reviewed.   ED Course  Procedures (including critical care time)  Medications  iohexol (OMNIPAQUE) 350 MG/ML injection 100 mL (100 mLs Intravenous Contrast Given 04/18/14 0551)    Patient Vitals for the past 24 hrs:  BP Pulse Resp SpO2  04/18/14 0700 123/55 mmHg 65 - 99 %  04/18/14 0630 (!) 128/49 mmHg 64 - 98 %  04/18/14 0615 (!) 145/53 mmHg 66 - 99 %  04/18/14 0545 (!) 138/52 mmHg 67 - 100 %  04/18/14 0500 160/57 mmHg 69 15 99 %  04/18/14 0445 176/61 mmHg 66 14 97 %  04/18/14 0430 165/60 mmHg 63 16 100 %  04/18/14 0415 156/55 mmHg (!) 57  18 99 %  04/18/14 0400 166/57 mmHg 60 20 98 %  04/18/14 0350 - - - 99 %  04/18/14 0350 162/56 mmHg (!) 59 (!) 9 99 %    At discharge- Reevaluation with update and discussion. After initial assessment and treatment, an updated evaluation reveals she is comfortable with reassuring blood pressure.  Findings discussed with patient and her friend, all questions answered.Daleen Bo L    Labs Review Labs Reviewed  COMPREHENSIVE METABOLIC PANEL - Abnormal; Notable for the following:    Potassium 3.4 (*)  Glucose, Bld 115 (*)    GFR calc non Af Amer 57 (*)    GFR calc Af Amer 66 (*)    All other components within normal limits  CBC WITH DIFFERENTIAL  Randolm Idol, ED    Imaging Review Dg Chest 2 View  04/18/2014   CLINICAL DATA:  Posterior lower chest pain and weakness.  EXAM: CHEST  2 VIEW  COMPARISON:  Chest radiograph February 15, 2014  FINDINGS: Cardiac silhouette is mildly enlarged, mediastinal silhouette is nonsuspicious, calcified aortic knob. Status post apparent LEFT lower lobectomy, as previously noted with pleural thickening and scarring. Similar coarse and pulmonary interstitium without focal consolidation. No pneumothorax.  Moderate degenerative change of thoracic spine. Osteopenia. Soft tissue planes are nonsuspicious.  IMPRESSION: Stable mild cardiomegaly and chronic interstitial changes, status post apparent LEFT lower lobectomy.   Electronically Signed   By: Elon Alas   On: 04/18/2014 03:31   Dg Lumbar Spine Complete  04/18/2014   CLINICAL DATA:  Chronic back pain, worsening tonight.  EXAM: LUMBAR SPINE - COMPLETE 4+ VIEW  COMPARISON:  None.  FINDINGS: Lumbar vertebral bodies appear intact and aligned with maintenance of lumbar lordosis. Severe L3-4 and L4-5 disc height loss, endplate sclerosis and vacuum phenomenon, moderate to severe at the remaining lumbar levels consistent with degenerative discs. No destructive bony lesions. No pars interarticularis defects  though, limited assessment on the LEFT due to incomplete obliquity. Mild broad upper lumbar levoscoliosis. Moderate L5-S1 facet arthropathy.  Phleboliths within the pelvis. RIGHT hip total arthroplasty with partially imaged suspected osteolysis. Surgical clips in the pelvis.  IMPRESSION: No acute fracture deformity or malalignment. Degenerative change of the lumbar spine.  Status post RIGHT hip total arthroplasty with suspected osteolysis, recommend correlation with prior imaging.   Electronically Signed   By: Elon Alas   On: 04/18/2014 05:35   Ct Angio Chest Pe W/cm &/or Wo Cm  04/18/2014   CLINICAL DATA:  Acute onset of left-sided chest pain and back pain. Initial encounter.  EXAM: CT ANGIOGRAPHY CHEST WITH CONTRAST  TECHNIQUE: Multidetector CT imaging of the chest was performed using the standard protocol during bolus administration of intravenous contrast. Multiplanar CT image reconstructions and MIPs were obtained to evaluate the vascular anatomy.  CONTRAST:  131mL OMNIPAQUE IOHEXOL 350 MG/ML SOLN  COMPARISON:  Chest radiograph performed earlier today at 3:09 a.m.  FINDINGS: There is no evidence of pulmonary embolus.  The patient is status post left lower lobectomy. Mild scarring is noted at the posterior aspect of the left upper lobe, and at the left lung apex. Minimal bilateral atelectasis is seen. The lungs are otherwise clear. There is no evidence of significant focal consolidation, pleural effusion or pneumothorax. No masses are identified; no abnormal focal contrast enhancement is seen.  Dense calcification is seen at the mitral valve. The mediastinum is otherwise unremarkable. No mediastinal lymphadenopathy is seen. No pericardial effusion is identified. Scattered calcification is noted along the aortic arch and proximal great vessels. No axillary lymphadenopathy is seen. The visualized portions of the thyroid gland are unremarkable in appearance.  The visualized portions of the liver and  spleen are unremarkable. The visualized portions of the pancreas, gallbladder, stomach, adrenal glands and kidneys are within normal limits.  No acute osseous abnormalities are seen. Multilevel heterogeneous sclerotic change is noted along the lower thoracic spine, with associated vacuum phenomenon. Though this is likely degenerative, would correlate for any associated symptoms to assess need for further evaluation.  Review of the MIP images confirms  the above findings.  IMPRESSION: 1. No evidence of pulmonary embolus. 2. Status post left lower lobectomy. Mild scarring at the left lung. Minimal bilateral atelectasis seen. 3. Dense calcification at the mitral valve. 4. Multilevel heterogeneous sclerotic change along the lower thoracic spine, with associated vacuum phenomenon. Though this is likely degenerative, would correlate for any associated symptoms to assess need for further evaluation.   Electronically Signed   By: Garald Balding M.D.   On: 04/18/2014 06:29     EKG Interpretation   Date/Time:  Thursday April 18 2014 02:41:06 EST Ventricular Rate:  66 PR Interval:  124 QRS Duration: 90 QT Interval:  458 QTC Calculation: 480 R Axis:   -12 Text Interpretation:  Normal sinus rhythm Possible Left atrial enlargement  Septal infarct , age undetermined Abnormal ECG No old tracing to compare  Confirmed by [facility]  MD, [place] [phone]) on 04/19/2014 2:55:08 AM      MDM   Final diagnoses:  Shoulder pain  Back pain, unspecified location  Hypertension, essential  Spondylosis of lumbar region without myelopathy or radiculopathy  Degenerative joint disease of thoracic spine    Back pain is likely secondary to degenerative joint disease of the spine.  Left arm pain is nonspecific.  I doubt ACS, PE or pneumonia.  There is no evidence for hypertensive urgency.  Nursing Notes Reviewed/ Care Coordinated Applicable Imaging Reviewed Interpretation of Laboratory Data incorporated into ED  treatment  The patient appears reasonably screened and/or stabilized for discharge and I doubt any other medical condition or other [facility] requiring further screening, evaluation, or treatment in the ED at this time prior to discharge.  Plan: Home Medications- Tylenol for pain; Home Treatments- rest, heat; return here if the recommended treatment, does not improve the symptoms; Recommended follow up- PCP/ Cardiology prn   Richarda Blade, MD 04/19/14 [phone]

## 2014-04-19 NOTE — Telephone Encounter (Signed)
Pt c/o BP issue: STAT if pt c/o blurred vision, one-sided weakness or slurred speech  1. What are your last 5 BP readings?    2. Are you having any other symptoms (ex. Dizziness, headache, blurred vision, passed out)? Pain under left shoulder blade, weak, headache, sleepy  3. What is your BP issue? consistantly high  Patient is here for scan this morning and would like to see nurse before leaving today.  Feels bad.  Family with Patient and would like to talk about what is causing this and can we change the medication.  Please see patient before she leaves.

## 2014-04-19 NOTE — Telephone Encounter (Signed)
Called patient and caregiver  Informed them of Cath/Renal Angio date and time  Wednesday 04/24/14 at 1130  Patient to arrive at 0930  Reviewed instruction letters  They verbalized understanding

## 2014-04-19 NOTE — Telephone Encounter (Signed)
Patient blood pressure in clinic was 180/77  She complains of pain 5/10 Her pain "is sharp like she is being zapped" on her left side and a few inches below her left breast  The pain is intermittent  Discussed patient symptoms with Dr. Fletcher Anon  He gave verbal orders to:  Increase Hydralazine to 50 mg TID (can take 1/2 tablet (25 mg on days BP is low)  Patient and caregiver verbalized understanding   Start Protonix 40 mg once daily   Follow up with Dr. Gilford Rile ASAP

## 2014-04-23 ENCOUNTER — Ambulatory Visit: Payer: Medicare HMO | Admitting: Internal Medicine

## 2014-04-23 ENCOUNTER — Telehealth: Payer: Self-pay | Admitting: Nurse Practitioner

## 2014-04-23 NOTE — Telephone Encounter (Signed)
Pt's POA Tamara Burton) called this evening to report that Tamara Burton's BP is elevated again - systolic 887.  She has already taken her PM dose of coreg and just took her PM dose of hydralazine 50 mg (took 2 hrs earlier than usual b/c of BP).  She is lethargic, which according to her POA is nothing new.  I've recommended f/u BP in ~ 1 hr and take an additional 25 mg of hydralazine if BP remains > 195 systolic.  Tamara Burton verbalized understanding and will call back if there are any other issues.  Of note, Tamara Burton is scheduled for renal angiography tomorrow with Dr. Fletcher Anon @ [facility].

## 2014-04-24 ENCOUNTER — Observation Stay (HOSPITAL_COMMUNITY)
Admission: RE | Admit: 2014-04-24 | Discharge: 2014-04-25 | Disposition: A | Discharge status: Home / Self Care | Payer: Medicare HMO | Source: Ambulatory Visit | Attending: Cardiovascular Disease | Admitting: Cardiovascular Disease

## 2014-04-24 ENCOUNTER — Encounter (HOSPITAL_COMMUNITY): Payer: Self-pay | Admitting: General Practice

## 2014-04-24 ENCOUNTER — Encounter (HOSPITAL_COMMUNITY)
Admission: RE | Disposition: A | Discharge status: Home / Self Care | Payer: Self-pay | Source: Ambulatory Visit | Attending: Cardiovascular Disease

## 2014-04-24 DIAGNOSIS — Z85118 Personal history of other malignant neoplasm of bronchus and lung: Secondary | ICD-10-CM | POA: Insufficient documentation

## 2014-04-24 DIAGNOSIS — G4730 Sleep apnea, unspecified: Secondary | ICD-10-CM | POA: Insufficient documentation

## 2014-04-24 DIAGNOSIS — I150 Renovascular hypertension: Secondary | ICD-10-CM | POA: Diagnosis not present

## 2014-04-24 DIAGNOSIS — Z880 Allergy status to penicillin: Secondary | ICD-10-CM | POA: Diagnosis not present

## 2014-04-24 DIAGNOSIS — I701 Atherosclerosis of renal artery: Principal | ICD-10-CM | POA: Insufficient documentation

## 2014-04-24 DIAGNOSIS — T50995A Adverse effect of other drugs, medicaments and biological substances, initial encounter: Secondary | ICD-10-CM | POA: Diagnosis not present

## 2014-04-24 DIAGNOSIS — E785 Hyperlipidemia, unspecified: Secondary | ICD-10-CM | POA: Diagnosis not present

## 2014-04-24 DIAGNOSIS — Z96652 Presence of left artificial knee joint: Secondary | ICD-10-CM | POA: Diagnosis not present

## 2014-04-24 DIAGNOSIS — Z7982 Long term (current) use of aspirin: Secondary | ICD-10-CM | POA: Insufficient documentation

## 2014-04-24 DIAGNOSIS — R079 Chest pain, unspecified: Secondary | ICD-10-CM | POA: Insufficient documentation

## 2014-04-24 HISTORY — DX: Nausea with vomiting, unspecified: Z98.890

## 2014-04-24 HISTORY — DX: Atherosclerosis of renal artery: I70.1

## 2014-04-24 HISTORY — DX: Other chronic pain: G89.29

## 2014-04-24 HISTORY — DX: Headache, unspecified: R51.9

## 2014-04-24 HISTORY — DX: Other specified postprocedural states: R11.2

## 2014-04-24 HISTORY — DX: Obstructive sleep apnea (adult) (pediatric): G47.33

## 2014-04-24 HISTORY — DX: Atherosclerotic heart disease of native coronary artery without angina pectoris: I25.10

## 2014-04-24 HISTORY — DX: Headache: R51

## 2014-04-24 HISTORY — PX: LEFT HEART CATHETERIZATION WITH CORONARY ANGIOGRAM: SHX5451

## 2014-04-24 HISTORY — DX: Dependence on other enabling machines and devices: Z99.89

## 2014-04-24 HISTORY — DX: Low back pain: M54.5

## 2014-04-24 HISTORY — DX: Essential (primary) hypertension: I10

## 2014-04-24 HISTORY — PX: RENAL ANGIOGRAM: SHX6061

## 2014-04-24 HISTORY — DX: Low back pain, unspecified: M54.50

## 2014-04-24 HISTORY — PX: CARDIAC CATHETERIZATION: SHX172

## 2014-04-24 HISTORY — PX: RENAL ANGIOGRAM: SHX5509

## 2014-04-24 LAB — POCT ACTIVATED CLOTTING TIME
Activated Clotting Time: 196 seconds
Activated Clotting Time: 153 seconds

## 2014-04-24 SURGERY — LEFT HEART CATHETERIZATION WITH CORONARY ANGIOGRAM
Anesthesia: LOCAL

## 2014-04-24 MED ORDER — SODIUM CHLORIDE 0.9 % IJ SOLN: 3.0000 mL | Freq: Two times a day (BID) | INTRAMUSCULAR | Status: DC

## 2014-04-24 MED ORDER — LIDOCAINE HCL (PF) 1 % IJ SOLN
INTRAMUSCULAR | Status: AC
  Filled 2014-04-24: qty 30

## 2014-04-24 MED ORDER — VITAMIN C ER 1500 MG PO TBCR: EXTENDED_RELEASE_TABLET | Freq: Every day | ORAL | Status: DC

## 2014-04-24 MED ORDER — ONDANSETRON HCL 4 MG/2ML IJ SOLN: 4.0000 mg | Freq: Four times a day (QID) | INTRAMUSCULAR | Status: DC | PRN

## 2014-04-24 MED ORDER — SODIUM CHLORIDE 0.9 % IV SOLN: INTRAVENOUS | Status: AC

## 2014-04-24 MED ORDER — HYDRALAZINE HCL 20 MG/ML IJ SOLN: 10.0000 mg | INTRAMUSCULAR | Status: DC | PRN

## 2014-04-24 MED ORDER — MIDAZOLAM HCL 2 MG/2ML IJ SOLN
INTRAMUSCULAR | Status: AC
  Filled 2014-04-24: qty 2

## 2014-04-24 MED ORDER — SODIUM CHLORIDE 0.9 % IJ SOLN
3.0000 mL | INTRAMUSCULAR | Status: DC | PRN
Start: 2014-04-24 — End: 2014-04-24

## 2014-04-24 MED ORDER — HYDROCHLOROTHIAZIDE 12.5 MG PO CAPS
12.5000 mg | ORAL_CAPSULE | Freq: Every day | ORAL | Status: DC
  Administered 2014-04-25: 10:00:00 12.5 mg via ORAL
  Filled 2014-04-24 (×2): qty 1

## 2014-04-24 MED ORDER — HYDRALAZINE HCL 50 MG PO TABS
50.0000 mg | ORAL_TABLET | Freq: Three times a day (TID) | ORAL | Status: DC
  Administered 2014-04-24 – 2014-04-25 (×2): 50 mg via ORAL
  Filled 2014-04-24 (×6): qty 1

## 2014-04-24 MED ORDER — VITAMIN C 250 MG PO TABS
250.0000 mg | ORAL_TABLET | Freq: Every day | ORAL | Status: DC
  Administered 2014-04-24 – 2014-04-25 (×2): 250 mg via ORAL
  Filled 2014-04-24 (×2): qty 1

## 2014-04-24 MED ORDER — FLUTICASONE PROPIONATE 50 MCG/ACT NA SUSP
1.0000 | Freq: Every day | NASAL | Status: DC | PRN
  Filled 2014-04-24: qty 16

## 2014-04-24 MED ORDER — HEPARIN (PORCINE) IN NACL 2-0.9 UNIT/ML-% IJ SOLN
INTRAMUSCULAR | Status: AC
  Filled 2014-04-24: qty 1000

## 2014-04-24 MED ORDER — SODIUM CHLORIDE 0.9 % IV SOLN: 250.0000 mL | INTRAVENOUS | Status: DC | PRN

## 2014-04-24 MED ORDER — ROSUVASTATIN CALCIUM 5 MG PO TABS
5.0000 mg | ORAL_TABLET | Freq: Every day | ORAL | Status: DC
  Administered 2014-04-24 – 2014-04-25 (×2): 5 mg via ORAL
  Filled 2014-04-24 (×2): qty 1

## 2014-04-24 MED ORDER — FENTANYL CITRATE 0.05 MG/ML IJ SOLN
INTRAMUSCULAR | Status: AC
  Filled 2014-04-24: qty 2

## 2014-04-24 MED ORDER — ACETAMINOPHEN 325 MG PO TABS: 650.0000 mg | ORAL_TABLET | ORAL | Status: DC | PRN

## 2014-04-24 MED ORDER — SODIUM CHLORIDE 0.9 % IV SOLN
INTRAVENOUS | Status: DC
  Administered 2014-04-24: 10:00:00 via INTRAVENOUS

## 2014-04-24 MED ORDER — CARVEDILOL 6.25 MG PO TABS
6.2500 mg | ORAL_TABLET | Freq: Two times a day (BID) | ORAL | Status: DC
  Administered 2014-04-24 – 2014-04-25 (×2): 6.25 mg via ORAL
  Filled 2014-04-24 (×4): qty 1

## 2014-04-24 MED ORDER — PANTOPRAZOLE SODIUM 40 MG PO TBEC
40.0000 mg | DELAYED_RELEASE_TABLET | Freq: Every day | ORAL | Status: DC
  Administered 2014-04-25: 10:00:00 40 mg via ORAL
  Filled 2014-04-24: qty 1

## 2014-04-24 MED ORDER — LOSARTAN POTASSIUM 50 MG PO TABS
100.0000 mg | ORAL_TABLET | Freq: Every day | ORAL | Status: DC
  Administered 2014-04-25: 100 mg via ORAL
  Filled 2014-04-24 (×2): qty 2

## 2014-04-24 MED ORDER — TRAZODONE 25 MG HALF TABLET
25.0000 mg | ORAL_TABLET | Freq: Every evening | ORAL | Status: DC | PRN
  Filled 2014-04-24: qty 2

## 2014-04-24 MED ORDER — ASPIRIN 81 MG PO CHEW: 81.0000 mg | CHEWABLE_TABLET | ORAL | Status: DC

## 2014-04-24 MED ORDER — ASPIRIN 81 MG PO CHEW
81.0000 mg | CHEWABLE_TABLET | Freq: Every day | ORAL | Status: DC
  Administered 2014-04-25: 10:00:00 81 mg via ORAL
  Filled 2014-04-24: qty 1

## 2014-04-24 NOTE — Progress Notes (Signed)
Site area: right groin  Site Prior to Removal:  Level 0  Pressure Applied For 20 MINUTES    Minutes Beginning at 1400  Manual:   Yes.    Patient Status During Pull:   AAO X 4  Post Pull Groin Site:  Level 0  Post Pull Instructions Given:  Yes.    Post Pull Pulses Present:  Yes.    Dressing Applied:  Yes.    Comments:  TOLERATED PROCEDURE WELL

## 2014-04-24 NOTE — CV Procedure (Signed)
    Cardiac Catheterization Procedure Note  Name: Tamara Burton MRN: [id number] DOB: 06/23/1933  Procedure: Left Heart Cath, Selective Coronary Angiography, nonselective renal angiography  Indication: Renovascular hypertension with inability to control blood pressure with medications. Recent symptoms of chest pain and shortness of breath worrisome for unstable angina.   Medications:  Sedation:  1 mg IV Versed, 75 mcg IV Fentanyl  Contrast:  80 mL Omnipaque  Procedural details: The right groin was prepped, draped, and anesthetized with 1% lidocaine. Using modified Seldinger technique, a 6 French sheath was introduced into the right femoral artery. Standard Judkins catheters were used for coronary angiography . A pigtail catheter was used for nonselective renal angiography. Catheter exchanges were performed over a guidewire. There were no immediate procedural complications. The patient was transferred to the post catheterization recovery area for further monitoring.  Interventional procedure details: Left renal artery angioplasty was planned. 4000 units of unfractionated heparin was given. I then attempted to advance an IM guiding catheter over the wire but there was some resistance in the distal aorta. Further advancement was not possible and I lost wire position. I performed angiography in that area which showed that there was dissection into the distal aorta. I attempted to get back into the true lumen but was not successful. I decided to abort the procedure in order to prevent worsening of the dissection plane. There was normal pulses in both femoral arteries. The patient had minimal discomfort as well.   Procedural Findings:  Hemodynamics: AO:  130/70   mmHg LV:  1:15 over 5    mmHg LVEDP: 8  mmHg  Coronary angiography: Coronary dominance: Right   Left Main:  Normal  Left Anterior Descending (LAD):  Normal in size with minor irregularities.  1st diagonal (D1):  Large in size with  minor irregularities.   2nd diagonal (D2):  Very small in size  3rd diagonal (D3):  Very small in size.  Circumflex (LCx):  Normal in size and nondominant. The vessel has minor irregularities.   Right Coronary Artery: Normal in size and dominant. The vessel has minor irregularities.  Posterior descending artery: Normal  Posterior AV segment: Normal  Abdominal aortogram:: Celiac trunk and SMA are both patent. There is 95% proximal left renal artery stenosis and 40% ostial right renal artery stenosis. 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  Left ventriculography: Was not performed. EF was normal by echo.  Final Conclusions:   1. No significant coronary artery disease. 2. Normal left ventricular end-diastolic pressure. 3. Severe left renal artery stenosis. 4. Attempted left renal artery angioplasty but there was guiding catheter-induced dissection in the distal aorta before engaging the left renal artery. Thus, the procedure was aborted.  Recommendations:  Monitor overnight with blood pressure control. Pulses were equal bilaterally and the patient had minimal discomfort. The plan is to proceed with staged left renal artery angioplasty and stent placement via the left radial artery next week.   Kathlyn Sacramento MD, [facility] 04/24/2014, 12:02 PM

## 2014-04-24 NOTE — H&P (Signed)
History and Physical  Patient ID: Quatisha Zylka MRN: [id number] DOB/AGE: 1934/03/07 81 y.o. Admit date: 04/24/2014  Primary Care Physician: Rica Mast, MD Primary Cardiologist MA  HPI: This is a 81 year old female who is here today for cardiac catheterization and renal angiography.. She has known history of sleep apnea, carotid artery stenosis and hyperlipidemia . She was diagnosed with hypertension in November and blood pressure has been difficult to control. She was initially treated with amlodipine and hydrochlorothiazide. However, she started complaining of palpitations associated with an anxiety feeling. Thus, she was switched from both these medications to carvedilol. She reports resolution of palpitations since then. However, blood pressure continued to be elevated. I added losartan 50 mg once daily. However, blood pressure went up to above 200 and she was extremely anxious with headache and dizziness.  She had an echocardiogram done which showed normal LV systolic function with moderate left ventricular hypertrophy and otherwise no significant valvular abnormalities. The dose of losartan was increased to 50 mg twice daily. Blood pressure was fine this morning but increased again.  We have not been able to control blood pressure in spite of multiple blood pressure medications. Renal artery duplex showed evidence of severe bilateral renal artery stenosis. She started having symptoms of substernal chest tightness and shortness of breath over the last 4-5 days. The symptoms are new. Given current symptoms and inability to control blood pressure with medications, I recommended proceeding with renal angiography and possible renal stenting. Symptoms of new onset chest tightness and shortness of breath also are very concerning for unstable angina and thus cardiac catheterization is recommended at the time of renal angiography. I discussed risks and benefits and details.  Review of  systems complete and found to be negative unless listed above  Past Medical History  Diagnosis Date  . Lung cancer 2003    s/p left lower lobe resection  . Hyperlipidemia   . Orthostatic hypotension   . Arthritis   . Carotid artery disease   . Sleep apnea     on CPAP    Family History  Problem Relation Age of Onset  . Parkinsonism Mother   . Sarcoidosis Brother   . Stroke Maternal Grandmother   . Cancer Paternal Grandmother     History   Social History  . Marital Status: Widowed    Spouse Name: N/A    Number of Children: N/A  . Years of Education: N/A   Occupational History  . Not on file.   Social History Main Topics  . Smoking status: Never Smoker   . Smokeless tobacco: Not on file  . Alcohol Use: Yes     Comment: occasional  . Drug Use: No  . Sexual Activity: Not on file     Comment: Father and husband both smokers   Other Topics Concern  . Not on file   Social History Narrative   Lives in [place]. Daughter nearby.    Past Surgical History  Procedure Laterality Date  . Appendectomy    . Hip arthroplasty      right  . Replacement total knee      left  . Abdominal hysterectomy    . Bilateral oophorectomy    . Vaginal delivery      2  . Cataract extraction, bilateral       Prescriptions prior to admission  Medication Sig Dispense Refill Last Dose  . Ascorbic Acid (VITAMIN C CR) 1500 MG TBCR Take by mouth daily.   04/23/2014 at Unknown  time  . aspirin 81 MG tablet Take 81 mg by mouth daily.   04/24/2014 at 0730  . B Complex Vitamins (VITAMIN B COMPLEX PO) Take by mouth.   04/23/2014 at Unknown time  . carvedilol (COREG) 6.25 MG tablet Take 1 tablet (6.25 mg total) by mouth 2 (two) times daily with a meal. 60 tablet 3 04/24/2014 at 0730  . Cholecalciferol 2000 UNITS TABS Take 1 tablet by mouth daily.   04/23/2014 at Unknown time  . Fish Oil OIL by Does not apply route.   04/23/2014 at Unknown time  . fluticasone (FLONASE) 50 MCG/ACT nasal spray Place 1  spray into both nostrils daily as needed.    Past Week at Unknown time  . hydrALAZINE (APRESOLINE) 50 MG tablet Take 1 tablet (50 mg total) by mouth 3 (three) times daily. 270 tablet 3 04/24/2014 at 0515  . hydrochlorothiazide (MICROZIDE) 12.5 MG capsule Take 1 capsule (12.5 mg total) by mouth daily. 90 capsule 3 04/24/2014 at 0500  . losartan (COZAAR) 50 MG tablet Take 1 tablet (50 mg total) by mouth daily. (Patient taking differently: Take 50 mg by mouth 2 (two) times daily. ) 30 tablet 6 04/24/2014 at 0500  . Lysine HCl 500 MG CAPS Take 1,000 mg by mouth daily as needed (for cold sores).    04/23/2014 at Unknown time  . pantoprazole (PROTONIX) 40 MG tablet Take 1 tablet (40 mg total) by mouth daily. 30 tablet 11 04/24/2014 at 0600  . rosuvastatin (CRESTOR) 5 MG tablet Take 1 tablet (5 mg total) by mouth daily. 90 tablet 1 04/23/2014 at Unknown time  . traZODone (DESYREL) 50 MG tablet Take 0.5-1 tablets (25-50 mg total) by mouth at bedtime as needed for sleep. 90 tablet 0 04/23/2014 at Unknown time  . Zinc 50 MG TABS Take by mouth daily.   04/23/2014 at Unknown time    Physical Exam: Blood pressure 144/61, pulse 67, temperature 98 F (36.7 C), temperature source Oral, resp. rate 18, height 5\' 1"  (1.549 m), weight 56.246 kg (124 lb), SpO2 100 %.   Constitutional: She is oriented to person, place, and time. She appears well-developed and well-nourished. No distress.  HENT: No nasal discharge.  Head: Normocephalic and atraumatic.  Eyes: Pupils are equal and round. No discharge.  Neck: Normal range of motion. Neck supple. No JVD present. No thyromegaly present.  Cardiovascular: Normal rate, regular rhythm, normal heart sounds. Exam reveals no gallop and no friction rub. No murmur heard.  Pulmonary/Chest: Effort normal and breath sounds normal. No stridor. No respiratory distress. She has no wheezes. She has no rales. She exhibits no tenderness.  Abdominal: Soft. Bowel sounds are normal. She exhibits no  distension. There is no tenderness. There is no rebound and no guarding.  Musculoskeletal: Normal range of motion. She exhibits no edema and no tenderness.  Neurological: She is alert and oriented to person, place, and time. Coordination normal.  Skin: Skin is warm and dry. No rash noted. She is not diaphoretic. No erythema. No pallor.  Psychiatric: She has a normal mood and affect. Her behavior is normal. Judgment and thought content normal.    Labs:   Lab Results  Component Value Date   WBC 6.6 04/18/2014   HGB 14.1 04/18/2014   HCT 41.4 04/18/2014   MCV 84.7 04/18/2014   PLT 227 04/18/2014    Recent Labs Lab 04/18/14 0247  NA 141  K 3.4*  CL 102  CO2 31  BUN 15  CREATININE 0.93  CALCIUM 9.6  PROT 6.7  BILITOT 0.6  ALKPHOS 103  ALT 15  AST 23  GLUCOSE 115*   Lab Results  Component Value Date   CKTOTAL 64 06/04/2013    Lab Results  Component Value Date   CHOL 198 02/12/2014   CHOL 177 06/04/2013   CHOL 289* 02/05/2013   Lab Results  Component Value Date   HDL 65.30 02/12/2014   HDL 70.20 06/04/2013   HDL 74.50 02/05/2013   Lab Results  Component Value Date   LDLCALC 116* 02/12/2014   LDLCALC 90 06/04/2013   LDLCALC 99 07/06/2012   Lab Results  Component Value Date   TRIG 86.0 02/12/2014   TRIG 84.0 06/04/2013   TRIG 114.0 02/05/2013   Lab Results  Component Value Date   CHOLHDL 3 02/12/2014   CHOLHDL 3 06/04/2013   CHOLHDL 4 02/05/2013   Lab Results  Component Value Date   LDLDIRECT 201.7 02/05/2013      Radiology: Dg Chest 2 View  04/18/2014   CLINICAL DATA:  Posterior lower chest pain and weakness.  EXAM: CHEST  2 VIEW  COMPARISON:  Chest radiograph February 15, 2014  FINDINGS: Cardiac silhouette is mildly enlarged, mediastinal silhouette is nonsuspicious, calcified aortic knob. Status post apparent LEFT lower lobectomy, as previously noted with pleural thickening and scarring. Similar coarse and pulmonary interstitium without focal  consolidation. No pneumothorax.  Moderate degenerative change of thoracic spine. Osteopenia. Soft tissue planes are nonsuspicious.  IMPRESSION: Stable mild cardiomegaly and chronic interstitial changes, status post apparent LEFT lower lobectomy.   Electronically Signed   By: Elon Alas   On: 04/18/2014 03:31   Dg Lumbar Spine Complete  04/18/2014   CLINICAL DATA:  Chronic back pain, worsening tonight.  EXAM: LUMBAR SPINE - COMPLETE 4+ VIEW  COMPARISON:  None.  FINDINGS: Lumbar vertebral bodies appear intact and aligned with maintenance of lumbar lordosis. Severe L3-4 and L4-5 disc height loss, endplate sclerosis and vacuum phenomenon, moderate to severe at the remaining lumbar levels consistent with degenerative discs. No destructive bony lesions. No pars interarticularis defects though, limited assessment on the LEFT due to incomplete obliquity. Mild broad upper lumbar levoscoliosis. Moderate L5-S1 facet arthropathy.  Phleboliths within the pelvis. RIGHT hip total arthroplasty with partially imaged suspected osteolysis. Surgical clips in the pelvis.  IMPRESSION: No acute fracture deformity or malalignment. Degenerative change of the lumbar spine.  Status post RIGHT hip total arthroplasty with suspected osteolysis, recommend correlation with prior imaging.   Electronically Signed   By: Elon Alas   On: 04/18/2014 05:35   Ct Angio Chest Pe W/cm &/or Wo Cm  04/18/2014   CLINICAL DATA:  Acute onset of left-sided chest pain and back pain. Initial encounter.  EXAM: CT ANGIOGRAPHY CHEST WITH CONTRAST  TECHNIQUE: Multidetector CT imaging of the chest was performed using the standard protocol during bolus administration of intravenous contrast. Multiplanar CT image reconstructions and MIPs were obtained to evaluate the vascular anatomy.  CONTRAST:  113mL OMNIPAQUE IOHEXOL 350 MG/ML SOLN  COMPARISON:  Chest radiograph performed earlier today at 3:09 a.m.  FINDINGS: There is no evidence of pulmonary  embolus.  The patient is status post left lower lobectomy. Mild scarring is noted at the posterior aspect of the left upper lobe, and at the left lung apex. Minimal bilateral atelectasis is seen. The lungs are otherwise clear. There is no evidence of significant focal consolidation, pleural effusion or pneumothorax. No masses are identified; no abnormal focal contrast enhancement is seen.  Dense  calcification is seen at the mitral valve. The mediastinum is otherwise unremarkable. No mediastinal lymphadenopathy is seen. No pericardial effusion is identified. Scattered calcification is noted along the aortic arch and proximal great vessels. No axillary lymphadenopathy is seen. The visualized portions of the thyroid gland are unremarkable in appearance.  The visualized portions of the liver and spleen are unremarkable. The visualized portions of the pancreas, gallbladder, stomach, adrenal glands and kidneys are within normal limits.  No acute osseous abnormalities are seen. Multilevel heterogeneous sclerotic change is noted along the lower thoracic spine, with associated vacuum phenomenon. Though this is likely degenerative, would correlate for any associated symptoms to assess need for further evaluation.  Review of the MIP images confirms the above findings.  IMPRESSION: 1. No evidence of pulmonary embolus. 2. Status post left lower lobectomy. Mild scarring at the left lung. Minimal bilateral atelectasis seen. 3. Dense calcification at the mitral valve. 4. Multilevel heterogeneous sclerotic change along the lower thoracic spine, with associated vacuum phenomenon. Though this is likely degenerative, would correlate for any associated symptoms to assess need for further evaluation.   Electronically Signed   By: Garald Balding M.D.   On: 04/18/2014 06:29      ASSESSMENT AND PLAN:  1. Renovascular hypertension with evidence of severe bilateral carotid artery stenosis. Inability to control blood pressure in spite of  multiple blood pressure medications. The plan is to proceed with renal angiography and possible renal artery stenting. 2. New onset chest pain and shortness of breath worrisome for unstable angina could be due to uncontrolled hypertension. However, given multiple risk factors for coronary artery disease, cardiac catheterization is to be performed today at the time of renal angiography.   Signed: Kathlyn Sacramento MD, [facility] 04/24/2014, 10:35 AM

## 2014-04-25 ENCOUNTER — Encounter (HOSPITAL_COMMUNITY): Payer: Self-pay | Admitting: Nurse Practitioner

## 2014-04-25 ENCOUNTER — Telehealth: Payer: Self-pay

## 2014-04-25 DIAGNOSIS — E785 Hyperlipidemia, unspecified: Secondary | ICD-10-CM

## 2014-04-25 DIAGNOSIS — I701 Atherosclerosis of renal artery: Secondary | ICD-10-CM | POA: Diagnosis not present

## 2014-04-25 DIAGNOSIS — I10 Essential (primary) hypertension: Secondary | ICD-10-CM

## 2014-04-25 MED ORDER — ALPRAZOLAM 0.25 MG PO TABS: 0.2500 mg | ORAL_TABLET | Freq: Three times a day (TID) | ORAL | Status: DC

## 2014-04-25 MED ORDER — LOSARTAN POTASSIUM 50 MG PO TABS: 50.0000 mg | ORAL_TABLET | Freq: Two times a day (BID) | ORAL | Status: DC

## 2014-04-25 MED ORDER — ALPRAZOLAM 0.25 MG PO TABS: 0.2500 mg | ORAL_TABLET | Freq: Three times a day (TID) | ORAL | Status: DC | PRN

## 2014-04-25 NOTE — Progress Notes (Signed)
Pt claimed "feeling blood pressure is going up" checked b/p 200/53 mmhg, hydralazine 50 mg  given , kept pt comfortable. B/p monitored. Latest B/p rechecked 179/84 mmhg , pt denies any pain, "just feeling tired" chris berg updated , cleared pt for discharge.Pt instructed on b/p monitoring at home with meds instructions.

## 2014-04-25 NOTE — Discharge Summary (Signed)
Discharge Summary   Patient ID: Tamara Burton,  MRN: [id number], DOB/AGE: Apr 30, 1933 81 y.o.  Admit date: 04/24/2014 Discharge date: 04/25/2014  Primary Care Provider: Ronette Deter AZBELL Primary Cardiologist: Jerilynn Mages. Fletcher Anon, MD   Discharge Diagnoses Principal Problem:   RAS (renal artery stenosis)  **S/P renal angiography this admission revealing 95% Left Renal Artery stenosis.  PTA attempt aborted secondary to guide catheter induce distal aortic dissection.  Active Problems:   Essential hypertension, benign   Hyperlipidemia with target LDL less than 100   Statin intolerance  Allergies Allergies  Allergen Reactions  . Atorvastatin Other (See Comments)    Muscle weakness  . Penicillins Hives  . Sulfa Antibiotics Hives    Procedures  Cardiac Catheterization & Abdominal aortic angiography 1.20.2016  Hemodynamics: AO:  130/70   mmHg LV:  1:15 over 5    mmHg LVEDP: 8  mmHg  Coronary angiography: Coronary dominance: Right     Left Main:  Normal  Left Anterior Descending (LAD):  Normal in size with minor irregularities.  1st diagonal (D1):  Large in size with minor irregularities.    2nd diagonal (D2):  Very small in size  3rd diagonal (D3):  Very small in size.  Circumflex (LCx):  Normal in size and nondominant. The vessel has minor irregularities.     Right Coronary Artery: Normal in size and dominant. The vessel has minor irregularities.  Posterior descending artery: Normal  Posterior AV segment: Normal  Abdominal aortogram:: Celiac trunk and SMA are both patent. There is 95% proximal left renal artery stenosis and 40% ostial right renal artery stenosis. 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  Left ventriculography: Was not performed. EF was normal by echo.  Final Conclusions:   1. No significant coronary artery disease. 2. Normal left ventricular end-diastolic pressure. 3. Severe left renal artery stenosis. 4.  Attempted left renal artery angioplasty but there was guiding catheter-induced dissection in the distal aorta before engaging the left renal artery. Thus, the procedure was aborted.  Recommendations:  Monitor overnight with blood pressure control. Pulses were equal bilaterally and the patient had minimal discomfort. The plan is to proceed with staged left renal artery angioplasty and stent placement via the left radial artery next week.  _____________   History of Present Illness  81 year old female with a history of sleep apnea, carotid artery stenosis, and hyperlipidemia. She was recently diagnosed with hypertension and has since had difficult to control blood pressures. Medications have been adjusted as an outpatient with modest success. Recent renal arterial duplex revealed severe bilateral renal artery stenosis. She was seen in clinic by Dr. Fletcher Anon and also complained of intermittent chest discomfort and dyspnea and decision was made to pursue diagnostic catheterization as well as renal angiography.   Hospital Course  Patient presented to the [facility] catheterization laboratory on 04/24/2014 and underwent diagnostic cardiac catheterization revealing relatively normal coronary arteries. Abdominal aortic angiography was performed revealing a 95% proximal stenosis within the left renal artery and a 40% ostial stenosis within the right renal artery. The distal aorta was noted to have severe atherosclerosis with mild aneurysmal dilatation. Prior to attempting percutaneous transluminal angioplasty within the left renal artery, a guiding catheter-induced dissection was noted in the distal aorta and decision was made to abort the procedure with a plan to have her come back next week for repeat attempt via a left radial artery approach. Following angiography, the patient was observed overnight and has been stable. She has been ambulating this morning without  difficulty. She will be discharged home today in  good condition.  Discharge Vitals/Exam Blood pressure 126/33, pulse 70, temperature 98.2 F (36.8 C), temperature source Oral, resp. rate 18, height 5\' 1"  (1.549 m), weight 124 lb (56.246 kg), SpO2 97 %.  Filed Weights   04/24/14 0911  Weight: 124 lb (56.246 kg)   General: Pleasant, NAD Psych: Normal affect. Neuro: Alert and oriented X 3. Moves all extremities spontaneously. HEENT: Normal  Neck: Supple without bruits or JVD. Lungs:  Resp regular and unlabored, CTA. Heart: RRR no s3, s4, or murmurs. Abdomen: Soft, non-tender, non-distended, BS + x 4.  Extremities: No clubbing, cyanosis or edema. DP/PT/Radials 2+ and equal bilaterally.  Labs  None   Disposition  Pt is being discharged home today in good condition and will return next Wednesday.  Follow-up Plans & Appointments  Follow-up Information    Follow up with [facility] On 05/01/2014.   Why:  arrive at 11:00 AM for a 1:00 PM procedure.   Contact information:   [facility] [place]      Follow up with Kathlyn Sacramento, MD On 05/06/2014.   Specialty:  Cardiology   Why:  10:30 AM   Contact information:   [place] [address] [phone]       Follow up with Rica Mast, MD On 05/13/2014.   Specialty:  Internal Medicine   Why:  11:30 AM   Contact information:   [address] [place] [address] [phone]      Discharge Medications    Medication List    TAKE these medications        aspirin 81 MG tablet  Take 81 mg by mouth daily.     carvedilol 6.25 MG tablet  Commonly known as:  COREG  Take 1 tablet (6.25 mg total) by mouth 2 (two) times daily with a meal.     Cholecalciferol 2000 UNITS Tabs  Take 1 tablet by mouth daily.     Fish Oil Oil  by Does not apply route.     fluticasone 50 MCG/ACT nasal spray  Commonly known as:  FLONASE  Place 1 spray into both nostrils daily as needed.     hydrALAZINE 50  MG tablet  Commonly known as:  APRESOLINE  Take 1 tablet (50 mg total) by mouth 3 (three) times daily.     hydrochlorothiazide 12.5 MG capsule  Commonly known as:  MICROZIDE  Take 1 capsule (12.5 mg total) by mouth daily.     losartan 50 MG tablet  Commonly known as:  COZAAR  Take 1 tablet (50 mg total) by mouth 2 (two) times daily.     Lysine HCl 500 MG Caps  Take 1,000 mg by mouth daily as needed (for cold sores).     pantoprazole 40 MG tablet  Commonly known as:  PROTONIX  Take 1 tablet (40 mg total) by mouth daily.     rosuvastatin 5 MG tablet  Commonly known as:  CRESTOR  Take 1 tablet (5 mg total) by mouth daily.     traZODone 50 MG tablet  Commonly known as:  DESYREL  Take 0.5-1 tablets (25-50 mg total) by mouth at bedtime as needed for sleep.     VITAMIN B COMPLEX PO  Take by mouth.     VITAMIN C CR 1500 MG Tbcr  Take by mouth daily.     Zinc 50 MG Tabs  Take by  mouth daily.       Outstanding Labs/Studies  None  Duration of Discharge Encounter   Greater than 30 minutes including physician time.  Signed, Murray Hodgkins NP 04/25/2014, 8:33 AM

## 2014-04-25 NOTE — Discharge Instructions (Signed)

## 2014-04-25 NOTE — Progress Notes (Signed)
UR completed 

## 2014-04-25 NOTE — Telephone Encounter (Signed)
Pt medical POW, Tamara Burton, states she has some questions regarding pt receiving Xanax , also has some questions regarding her Coreg

## 2014-04-25 NOTE — Telephone Encounter (Signed)
LVM to inform POA that Dr. Fletcher Anon placed xanax rx at from desk for pick up  He instructed patient to remain on 6.25 mg of Coreg BID and continue to monitor  Call if blood pressure remains elevated  POA verbalized understanding

## 2014-04-29 ENCOUNTER — Other Ambulatory Visit: Payer: BC Managed Care – PPO

## 2014-04-29 ENCOUNTER — Telehealth: Payer: Self-pay | Admitting: Cardiovascular Disease

## 2014-04-29 ENCOUNTER — Other Ambulatory Visit: Payer: Self-pay | Admitting: *Deleted

## 2014-04-29 MED ORDER — CARVEDILOL 6.25 MG PO TABS: 6.2500 mg | ORAL_TABLET | Freq: Two times a day (BID) | ORAL | Status: DC

## 2014-04-29 NOTE — Telephone Encounter (Signed)
Informed POA of procedure date and time  She verbalized understanding   She stated patient needs coreg refill  Refill sent to pharmacy

## 2014-04-29 NOTE — Telephone Encounter (Signed)
LVM to inform [name] that patients procedure is 05/01/14 at 1 pm  Please arrive at 11 am at the [facility]

## 2014-04-29 NOTE — Telephone Encounter (Signed)
°  1. Which medications need to be refilled? Coreg 12.5 twice daily  2. [facility]   3. Do they need a 30 day or 90 day supply? Same as previous  4. Would they like a call back once the medication has been sent to the pharmacy? No, Pharmacy will call.    Per Raylene Miyamoto, Dr. Fletcher Anon increased Coreg from 6.25 to 12.5  And patient is out of  Medication.  Will have morning dose for tomorrow but none left after that.

## 2014-04-29 NOTE — Telephone Encounter (Signed)
Patients contact Tamara Burton called to see what time she should arrive with patient for procedure.  Please call [name].

## 2014-04-30 ENCOUNTER — Other Ambulatory Visit: Payer: Self-pay | Admitting: Cardiovascular Disease

## 2014-04-30 DIAGNOSIS — I150 Renovascular hypertension: Secondary | ICD-10-CM

## 2014-05-01 ENCOUNTER — Encounter (HOSPITAL_COMMUNITY): Payer: Self-pay | Admitting: *Deleted

## 2014-05-01 ENCOUNTER — Ambulatory Visit (HOSPITAL_COMMUNITY)
Admission: RE | Admit: 2014-05-01 | Discharge: 2014-05-02 | Disposition: A | Discharge status: Home / Self Care | Payer: Medicare PPO | Source: Ambulatory Visit | Attending: Cardiovascular Disease | Admitting: Cardiovascular Disease

## 2014-05-01 ENCOUNTER — Encounter (HOSPITAL_COMMUNITY)
Admission: RE | Disposition: A | Discharge status: Home / Self Care | Payer: Self-pay | Source: Ambulatory Visit | Attending: Cardiovascular Disease

## 2014-05-01 DIAGNOSIS — E785 Hyperlipidemia, unspecified: Secondary | ICD-10-CM | POA: Diagnosis not present

## 2014-05-01 DIAGNOSIS — Z79899 Other long term (current) drug therapy: Secondary | ICD-10-CM | POA: Insufficient documentation

## 2014-05-01 DIAGNOSIS — I2510 Atherosclerotic heart disease of native coronary artery without angina pectoris: Secondary | ICD-10-CM | POA: Diagnosis not present

## 2014-05-01 DIAGNOSIS — Z85118 Personal history of other malignant neoplasm of bronchus and lung: Secondary | ICD-10-CM | POA: Diagnosis not present

## 2014-05-01 DIAGNOSIS — I10 Essential (primary) hypertension: Secondary | ICD-10-CM | POA: Diagnosis not present

## 2014-05-01 DIAGNOSIS — I150 Renovascular hypertension: Secondary | ICD-10-CM | POA: Diagnosis not present

## 2014-05-01 DIAGNOSIS — G4733 Obstructive sleep apnea (adult) (pediatric): Secondary | ICD-10-CM | POA: Diagnosis not present

## 2014-05-01 DIAGNOSIS — R079 Chest pain, unspecified: Secondary | ICD-10-CM | POA: Diagnosis not present

## 2014-05-01 DIAGNOSIS — I701 Atherosclerosis of renal artery: Secondary | ICD-10-CM

## 2014-05-01 DIAGNOSIS — I6523 Occlusion and stenosis of bilateral carotid arteries: Secondary | ICD-10-CM | POA: Insufficient documentation

## 2014-05-01 DIAGNOSIS — Z7982 Long term (current) use of aspirin: Secondary | ICD-10-CM | POA: Diagnosis not present

## 2014-05-01 HISTORY — PX: PERIPHERAL VASCULAR CATHETERIZATION: SHX172C

## 2014-05-01 HISTORY — PX: RENAL ANGIOGRAM: SHX5509

## 2014-05-01 LAB — CBC
HCT: 35 % — ABNORMAL LOW (ref 36.0–46.0)
Hemoglobin: 11.9 g/dL — ABNORMAL LOW (ref 12.0–15.0)
MCH: 29 pg (ref 26.0–34.0)
MCHC: 34 g/dL (ref 30.0–36.0)
MCV: 85.4 fL (ref 78.0–100.0)
Platelets: 233 10*3/uL (ref 150–400)
RBC: 4.1 MIL/uL (ref 3.87–5.11)
RDW: 13 % (ref 11.5–15.5)
WBC: 6.7 10*3/uL (ref 4.0–10.5)

## 2014-05-01 LAB — BASIC METABOLIC PANEL
ANION GAP: 10 (ref 5–15)
BUN: 21 mg/dL (ref 6–23)
CALCIUM: 9.3 mg/dL (ref 8.4–10.5)
CHLORIDE: 102 mmol/L (ref 96–112)
CO2: 26 mmol/L (ref 19–32)
CREATININE: 0.92 mg/dL (ref 0.50–1.10)
GFR calc Af Amer: 67 mL/min — ABNORMAL LOW (ref >90)
GFR calc non Af Amer: 58 mL/min — ABNORMAL LOW (ref >90)
Glucose, Bld: 111 mg/dL — ABNORMAL HIGH (ref 70–99)
POTASSIUM: 3.7 mmol/L (ref 3.5–5.1)
Sodium: 138 mmol/L (ref 135–145)

## 2014-05-01 LAB — PROTIME-INR
INR: 0.95 (ref 0.00–1.49)
PROTHROMBIN TIME: 12.8 s (ref 11.6–15.2)

## 2014-05-01 LAB — POCT ACTIVATED CLOTTING TIME: ACTIVATED CLOTTING TIME: 239 s

## 2014-05-01 SURGERY — RENAL ANGIOGRAM
Anesthesia: LOCAL | Laterality: Left

## 2014-05-01 MED ORDER — MIDAZOLAM HCL 2 MG/2ML IJ SOLN
INTRAMUSCULAR | Status: AC
  Filled 2014-05-01: qty 2

## 2014-05-01 MED ORDER — ALPRAZOLAM 0.25 MG PO TABS: 0.2500 mg | ORAL_TABLET | Freq: Three times a day (TID) | ORAL | Status: DC | PRN

## 2014-05-01 MED ORDER — CARVEDILOL 6.25 MG PO TABS
6.2500 mg | ORAL_TABLET | Freq: Two times a day (BID) | ORAL | Status: DC
  Administered 2014-05-02: 6.25 mg via ORAL
  Filled 2014-05-01 (×4): qty 1

## 2014-05-01 MED ORDER — FENTANYL CITRATE 0.05 MG/ML IJ SOLN
INTRAMUSCULAR | Status: AC
  Filled 2014-05-01: qty 2

## 2014-05-01 MED ORDER — ROSUVASTATIN CALCIUM 5 MG PO TABS
5.0000 mg | ORAL_TABLET | Freq: Every day | ORAL | Status: DC
  Administered 2014-05-01: 5 mg via ORAL
  Filled 2014-05-01 (×2): qty 1

## 2014-05-01 MED ORDER — ASPIRIN 81 MG PO CHEW
81.0000 mg | CHEWABLE_TABLET | Freq: Every day | ORAL | Status: DC
  Administered 2014-05-02: 81 mg via ORAL
  Filled 2014-05-01: qty 1

## 2014-05-01 MED ORDER — SODIUM CHLORIDE 0.9 % IJ SOLN: 3.0000 mL | Freq: Two times a day (BID) | INTRAMUSCULAR | Status: DC

## 2014-05-01 MED ORDER — SODIUM CHLORIDE 0.9 % IV SOLN
INTRAVENOUS | Status: AC
Start: 2014-05-01 — End: 2014-05-02

## 2014-05-01 MED ORDER — LOSARTAN POTASSIUM 50 MG PO TABS
50.0000 mg | ORAL_TABLET | Freq: Two times a day (BID) | ORAL | Status: DC
  Administered 2014-05-02: 50 mg via ORAL
  Filled 2014-05-01 (×3): qty 1

## 2014-05-01 MED ORDER — TRAZODONE 25 MG HALF TABLET
25.0000 mg | ORAL_TABLET | Freq: Every evening | ORAL | Status: DC | PRN
  Filled 2014-05-01: qty 2

## 2014-05-01 MED ORDER — ASPIRIN 81 MG PO CHEW: 81.0000 mg | CHEWABLE_TABLET | ORAL | Status: DC

## 2014-05-01 MED ORDER — SODIUM CHLORIDE 0.9 % IJ SOLN: 3.0000 mL | INTRAMUSCULAR | Status: DC | PRN

## 2014-05-01 MED ORDER — HEPARIN (PORCINE) IN NACL 2-0.9 UNIT/ML-% IJ SOLN
INTRAMUSCULAR | Status: AC
  Filled 2014-05-01: qty 1000

## 2014-05-01 MED ORDER — HYDROCHLOROTHIAZIDE 12.5 MG PO CAPS
12.5000 mg | ORAL_CAPSULE | Freq: Every day | ORAL | Status: DC
  Administered 2014-05-02: 12.5 mg via ORAL
  Filled 2014-05-01: qty 1

## 2014-05-01 MED ORDER — HEPARIN SODIUM (PORCINE) 1000 UNIT/ML IJ SOLN
INTRAMUSCULAR | Status: AC
  Filled 2014-05-01: qty 1

## 2014-05-01 MED ORDER — SODIUM CHLORIDE 0.9 % IV SOLN: INTRAVENOUS | Status: DC

## 2014-05-01 MED ORDER — VERAPAMIL HCL 2.5 MG/ML IV SOLN
INTRAVENOUS | Status: AC
  Filled 2014-05-01: qty 2

## 2014-05-01 MED ORDER — HYDRALAZINE HCL 50 MG PO TABS
50.0000 mg | ORAL_TABLET | Freq: Three times a day (TID) | ORAL | Status: DC
  Administered 2014-05-02: 50 mg via ORAL
  Filled 2014-05-01 (×5): qty 1

## 2014-05-01 MED ORDER — SODIUM CHLORIDE 0.9 % IV SOLN: 250.0000 mL | INTRAVENOUS | Status: DC | PRN

## 2014-05-01 MED ORDER — CLOPIDOGREL BISULFATE 75 MG PO TABS: 300.0000 mg | ORAL_TABLET | ORAL | Status: DC

## 2014-05-01 MED ORDER — CLOPIDOGREL BISULFATE 300 MG PO TABS
ORAL_TABLET | ORAL | Status: AC
  Filled 2014-05-01: qty 1

## 2014-05-01 MED ORDER — CLOPIDOGREL BISULFATE 75 MG PO TABS
75.0000 mg | ORAL_TABLET | Freq: Every day | ORAL | Status: DC
  Administered 2014-05-02: 08:00:00 75 mg via ORAL
  Filled 2014-05-01: qty 1

## 2014-05-01 MED ORDER — PANTOPRAZOLE SODIUM 40 MG PO TBEC
40.0000 mg | DELAYED_RELEASE_TABLET | Freq: Every day | ORAL | Status: DC
  Administered 2014-05-02: 08:00:00 40 mg via ORAL
  Filled 2014-05-01: qty 1

## 2014-05-01 MED ORDER — LIDOCAINE HCL (PF) 1 % IJ SOLN
INTRAMUSCULAR | Status: AC
  Filled 2014-05-01: qty 30

## 2014-05-01 NOTE — H&P (View-Only) (Signed)
History and Physical  Patient ID: Stuart Guillen MRN: [id number] DOB/AGE: 79/12/1933 79 y.o. Admit date: 04/24/2014  Primary Care Physician: Rica Mast, MD Primary Cardiologist MA  HPI: This is a 81 year old female who is here today for cardiac catheterization and renal angiography.. She has known history of sleep apnea, carotid artery stenosis and hyperlipidemia . She was diagnosed with hypertension in November and blood pressure has been difficult to control. She was initially treated with amlodipine and hydrochlorothiazide. However, she started complaining of palpitations associated with an anxiety feeling. Thus, she was switched from both these medications to carvedilol. She reports resolution of palpitations since then. However, blood pressure continued to be elevated. I added losartan 50 mg once daily. However, blood pressure went up to above 200 and she was extremely anxious with headache and dizziness.  She had an echocardiogram done which showed normal LV systolic function with moderate left ventricular hypertrophy and otherwise no significant valvular abnormalities. The dose of losartan was increased to 50 mg twice daily. Blood pressure was fine this morning but increased again.  We have not been able to control blood pressure in spite of multiple blood pressure medications. Renal artery duplex showed evidence of severe bilateral renal artery stenosis. She started having symptoms of substernal chest tightness and shortness of breath over the last 4-5 days. The symptoms are new. Given current symptoms and inability to control blood pressure with medications, I recommended proceeding with renal angiography and possible renal stenting. Symptoms of new onset chest tightness and shortness of breath also are very concerning for unstable angina and thus cardiac catheterization is recommended at the time of renal angiography. I discussed risks and benefits and details.  Review of  systems complete and found to be negative unless listed above  Past Medical History  Diagnosis Date  . Lung cancer 2003    s/p left lower lobe resection  . Hyperlipidemia   . Orthostatic hypotension   . Arthritis   . Carotid artery disease   . Sleep apnea     on CPAP    Family History  Problem Relation Age of Onset  . Parkinsonism Mother   . Sarcoidosis Brother   . Stroke Maternal Grandmother   . Cancer Paternal Grandmother     History   Social History  . Marital Status: Widowed    Spouse Name: N/A    Number of Children: N/A  . Years of Education: N/A   Occupational History  . Not on file.   Social History Main Topics  . Smoking status: Never Smoker   . Smokeless tobacco: Not on file  . Alcohol Use: Yes     Comment: occasional  . Drug Use: No  . Sexual Activity: Not on file     Comment: Father and husband both smokers   Other Topics Concern  . Not on file   Social History Narrative   Lives in [place]. Daughter nearby.    Past Surgical History  Procedure Laterality Date  . Appendectomy    . Hip arthroplasty      right  . Replacement total knee      left  . Abdominal hysterectomy    . Bilateral oophorectomy    . Vaginal delivery      2  . Cataract extraction, bilateral       Prescriptions prior to admission  Medication Sig Dispense Refill Last Dose  . Ascorbic Acid (VITAMIN C CR) 1500 MG TBCR Take by mouth daily.   04/23/2014 at Unknown  time  . aspirin 81 MG tablet Take 81 mg by mouth daily.   04/24/2014 at 0730  . B Complex Vitamins (VITAMIN B COMPLEX PO) Take by mouth.   04/23/2014 at Unknown time  . carvedilol (COREG) 6.25 MG tablet Take 1 tablet (6.25 mg total) by mouth 2 (two) times daily with a meal. 60 tablet 3 04/24/2014 at 0730  . Cholecalciferol 2000 UNITS TABS Take 1 tablet by mouth daily.   04/23/2014 at Unknown time  . Fish Oil OIL by Does not apply route.   04/23/2014 at Unknown time  . fluticasone (FLONASE) 50 MCG/ACT nasal spray Place 1  spray into both nostrils daily as needed.    Past Week at Unknown time  . hydrALAZINE (APRESOLINE) 50 MG tablet Take 1 tablet (50 mg total) by mouth 3 (three) times daily. 270 tablet 3 04/24/2014 at 0515  . hydrochlorothiazide (MICROZIDE) 12.5 MG capsule Take 1 capsule (12.5 mg total) by mouth daily. 90 capsule 3 04/24/2014 at 0500  . losartan (COZAAR) 50 MG tablet Take 1 tablet (50 mg total) by mouth daily. (Patient taking differently: Take 50 mg by mouth 2 (two) times daily. ) 30 tablet 6 04/24/2014 at 0500  . Lysine HCl 500 MG CAPS Take 1,000 mg by mouth daily as needed (for cold sores).    04/23/2014 at Unknown time  . pantoprazole (PROTONIX) 40 MG tablet Take 1 tablet (40 mg total) by mouth daily. 30 tablet 11 04/24/2014 at 0600  . rosuvastatin (CRESTOR) 5 MG tablet Take 1 tablet (5 mg total) by mouth daily. 90 tablet 1 04/23/2014 at Unknown time  . traZODone (DESYREL) 50 MG tablet Take 0.5-1 tablets (25-50 mg total) by mouth at bedtime as needed for sleep. 90 tablet 0 04/23/2014 at Unknown time  . Zinc 50 MG TABS Take by mouth daily.   04/23/2014 at Unknown time    Physical Exam: Blood pressure 144/61, pulse 67, temperature 98 F (36.7 C), temperature source Oral, resp. rate 18, height 5\' 1"  (1.549 m), weight 56.246 kg (124 lb), SpO2 100 %.   Constitutional: She is oriented to person, place, and time. She appears well-developed and well-nourished. No distress.  HENT: No nasal discharge.  Head: Normocephalic and atraumatic.  Eyes: Pupils are equal and round. No discharge.  Neck: Normal range of motion. Neck supple. No JVD present. No thyromegaly present.  Cardiovascular: Normal rate, regular rhythm, normal heart sounds. Exam reveals no gallop and no friction rub. No murmur heard.  Pulmonary/Chest: Effort normal and breath sounds normal. No stridor. No respiratory distress. She has no wheezes. She has no rales. She exhibits no tenderness.  Abdominal: Soft. Bowel sounds are normal. She exhibits no  distension. There is no tenderness. There is no rebound and no guarding.  Musculoskeletal: Normal range of motion. She exhibits no edema and no tenderness.  Neurological: She is alert and oriented to person, place, and time. Coordination normal.  Skin: Skin is warm and dry. No rash noted. She is not diaphoretic. No erythema. No pallor.  Psychiatric: She has a normal mood and affect. Her behavior is normal. Judgment and thought content normal.    Labs:   Lab Results  Component Value Date   WBC 6.6 04/18/2014   HGB 14.1 04/18/2014   HCT 41.4 04/18/2014   MCV 84.7 04/18/2014   PLT 227 04/18/2014    Recent Labs Lab 04/18/14 0247  NA 141  K 3.4*  CL 102  CO2 31  BUN 15  CREATININE 0.93  CALCIUM 9.6  PROT 6.7  BILITOT 0.6  ALKPHOS 103  ALT 15  AST 23  GLUCOSE 115*   Lab Results  Component Value Date   CKTOTAL 64 06/04/2013    Lab Results  Component Value Date   CHOL 198 02/12/2014   CHOL 177 06/04/2013   CHOL 289* 02/05/2013   Lab Results  Component Value Date   HDL 65.30 02/12/2014   HDL 70.20 06/04/2013   HDL 74.50 02/05/2013   Lab Results  Component Value Date   LDLCALC 116* 02/12/2014   LDLCALC 90 06/04/2013   LDLCALC 99 07/06/2012   Lab Results  Component Value Date   TRIG 86.0 02/12/2014   TRIG 84.0 06/04/2013   TRIG 114.0 02/05/2013   Lab Results  Component Value Date   CHOLHDL 3 02/12/2014   CHOLHDL 3 06/04/2013   CHOLHDL 4 02/05/2013   Lab Results  Component Value Date   LDLDIRECT 201.7 02/05/2013      Radiology: Dg Chest 2 View  04/18/2014   CLINICAL DATA:  Posterior lower chest pain and weakness.  EXAM: CHEST  2 VIEW  COMPARISON:  Chest radiograph February 15, 2014  FINDINGS: Cardiac silhouette is mildly enlarged, mediastinal silhouette is nonsuspicious, calcified aortic knob. Status post apparent LEFT lower lobectomy, as previously noted with pleural thickening and scarring. Similar coarse and pulmonary interstitium without focal  consolidation. No pneumothorax.  Moderate degenerative change of thoracic spine. Osteopenia. Soft tissue planes are nonsuspicious.  IMPRESSION: Stable mild cardiomegaly and chronic interstitial changes, status post apparent LEFT lower lobectomy.   Electronically Signed   By: Elon Alas   On: 04/18/2014 03:31   Dg Lumbar Spine Complete  04/18/2014   CLINICAL DATA:  Chronic back pain, worsening tonight.  EXAM: LUMBAR SPINE - COMPLETE 4+ VIEW  COMPARISON:  None.  FINDINGS: Lumbar vertebral bodies appear intact and aligned with maintenance of lumbar lordosis. Severe L3-4 and L4-5 disc height loss, endplate sclerosis and vacuum phenomenon, moderate to severe at the remaining lumbar levels consistent with degenerative discs. No destructive bony lesions. No pars interarticularis defects though, limited assessment on the LEFT due to incomplete obliquity. Mild broad upper lumbar levoscoliosis. Moderate L5-S1 facet arthropathy.  Phleboliths within the pelvis. RIGHT hip total arthroplasty with partially imaged suspected osteolysis. Surgical clips in the pelvis.  IMPRESSION: No acute fracture deformity or malalignment. Degenerative change of the lumbar spine.  Status post RIGHT hip total arthroplasty with suspected osteolysis, recommend correlation with prior imaging.   Electronically Signed   By: Elon Alas   On: 04/18/2014 05:35   Ct Angio Chest Pe W/cm &/or Wo Cm  04/18/2014   CLINICAL DATA:  Acute onset of left-sided chest pain and back pain. Initial encounter.  EXAM: CT ANGIOGRAPHY CHEST WITH CONTRAST  TECHNIQUE: Multidetector CT imaging of the chest was performed using the standard protocol during bolus administration of intravenous contrast. Multiplanar CT image reconstructions and MIPs were obtained to evaluate the vascular anatomy.  CONTRAST:  167mL OMNIPAQUE IOHEXOL 350 MG/ML SOLN  COMPARISON:  Chest radiograph performed earlier today at 3:09 a.m.  FINDINGS: There is no evidence of pulmonary  embolus.  The patient is status post left lower lobectomy. Mild scarring is noted at the posterior aspect of the left upper lobe, and at the left lung apex. Minimal bilateral atelectasis is seen. The lungs are otherwise clear. There is no evidence of significant focal consolidation, pleural effusion or pneumothorax. No masses are identified; no abnormal focal contrast enhancement is seen.  Dense  calcification is seen at the mitral valve. The mediastinum is otherwise unremarkable. No mediastinal lymphadenopathy is seen. No pericardial effusion is identified. Scattered calcification is noted along the aortic arch and proximal great vessels. No axillary lymphadenopathy is seen. The visualized portions of the thyroid gland are unremarkable in appearance.  The visualized portions of the liver and spleen are unremarkable. The visualized portions of the pancreas, gallbladder, stomach, adrenal glands and kidneys are within normal limits.  No acute osseous abnormalities are seen. Multilevel heterogeneous sclerotic change is noted along the lower thoracic spine, with associated vacuum phenomenon. Though this is likely degenerative, would correlate for any associated symptoms to assess need for further evaluation.  Review of the MIP images confirms the above findings.  IMPRESSION: 1. No evidence of pulmonary embolus. 2. Status post left lower lobectomy. Mild scarring at the left lung. Minimal bilateral atelectasis seen. 3. Dense calcification at the mitral valve. 4. Multilevel heterogeneous sclerotic change along the lower thoracic spine, with associated vacuum phenomenon. Though this is likely degenerative, would correlate for any associated symptoms to assess need for further evaluation.   Electronically Signed   By: Garald Balding M.D.   On: 04/18/2014 06:29      ASSESSMENT AND PLAN:  1. Renovascular hypertension with evidence of severe bilateral carotid artery stenosis. Inability to control blood pressure in spite of  multiple blood pressure medications. The plan is to proceed with renal angiography and possible renal artery stenting. 2. New onset chest pain and shortness of breath worrisome for unstable angina could be due to uncontrolled hypertension. However, given multiple risk factors for coronary artery disease, cardiac catheterization is to be performed today at the time of renal angiography.   Signed: Kathlyn Sacramento MD, [facility] 04/24/2014, 10:35 AM

## 2014-05-01 NOTE — Interval H&P Note (Signed)
History and Physical Interval Note:  05/01/2014 1:51 PM  Jen Benedict  has presented today for surgery, with the diagnosis of Left renal stenosis  The various methods of treatment have been discussed with the patient and family. After consideration of risks, benefits and other options for treatment, the patient has consented to  Procedure(s): RENAL ANGIOGRAM (Left) as a surgical intervention .  The patient's history has been reviewed, patient examined, no change in status, stable for surgery.  I have reviewed the patient's chart and labs.  Questions were answered to the patient's satisfaction.     Kathlyn Sacramento

## 2014-05-01 NOTE — Progress Notes (Signed)
TR BAND REMOVAL  LOCATION:    left radial  DEFLATED PER PROTOCOL:    Yes.    TIME BAND OFF / DRESSING APPLIED:    1745   SITE UPON ARRIVAL:    Level 0  SITE AFTER BAND REMOVAL:    Level 0  REVERSE ALLEN'S TEST:     positive  CIRCULATION SENSATION AND MOVEMENT:    Within Normal Limits   Yes.    COMMENTS:   See freq VS Written post radial cath instructions given and reviewed w/ Pt and family.  Questions answered.  Pt denies complaints except hungry.  Cheese and crackers given with diet sprite.

## 2014-05-01 NOTE — CV Procedure (Signed)
    PERIPHERAL VASCULAR PROCEDURE  NAME:  Tamara Burton   MRN: [id number] DOB:  02-01-1934   ADMIT DATE: 05/01/2014  Performing Cardiologist: Kathlyn Sacramento Primary Physician: Rica Mast, MD   Procedures Performed:  Arterial access via the left radial artery  Selective left renal artery angiography  Left renal artery stent placement   Indication(s):  Refractory hypertension due to suspected renovascular hypertension from severe renal artery stenosis  Consent: The procedure with Risks/Benefits/Alternatives and Indications was reviewed with the patient .  All questions were answered.  Medications:  Sedation:  1 mg IV Versed, 25 mcg IV Fentanyl  Contrast:  70 mL  Visipaque   Procedural details: The left wrist was prepped and draped in a sterile fashion. It was anesthetized with 1% lidocaine. A 6 French slender sheath was placed in the left radial artery after an anterior puncture. 3 mg of verapamil was given. 4000 units of unfractionated heparin was given with an ACT above 2:30. A JR 4 guiding catheter was advanced over the versicolor wire into the ascending aorta. This engage the left renal artery. Renal artery angiography was performed which confirmed severe 95% proximal stenosis. The lesion was crossed with a stabilizer wire. It was direct stented with a 6 x 15 mm Herculink stent to 12 atm. The balloon was pulled back and inflated again to 14 atm. Final angiography showed excellent results with 10-20% residual stenosis and no pressure gradient by guiding catheter. Of note, There was severe pressure dampening with catheter engagement before stent placement. The patient tolerated the procedure well with no immediate complications.   Conclusions: 1. Severe ostial left renal artery stenosis. 2. successful stent placement to the left renal artery  Recommendations:  Monitor overnight for changes in blood pressure. Dual antiplatelet therapy is recommended for one  month.   Kathlyn Sacramento, MD, [facility] 05/01/2014 2:42 PM

## 2014-05-02 ENCOUNTER — Encounter (HOSPITAL_COMMUNITY): Payer: Self-pay | Admitting: Physician Assistant

## 2014-05-02 DIAGNOSIS — I701 Atherosclerosis of renal artery: Secondary | ICD-10-CM

## 2014-05-02 DIAGNOSIS — I150 Renovascular hypertension: Secondary | ICD-10-CM

## 2014-05-02 LAB — BASIC METABOLIC PANEL
Anion gap: 6 (ref 5–15)
BUN: 15 mg/dL (ref 6–23)
CO2: 28 mmol/L (ref 19–32)
Calcium: 8.8 mg/dL (ref 8.4–10.5)
Chloride: 106 mmol/L (ref 96–112)
Creatinine, Ser: 0.83 mg/dL (ref 0.50–1.10)
GFR calc non Af Amer: 66 mL/min — ABNORMAL LOW (ref >90)
GFR, EST AFRICAN AMERICAN: 76 mL/min — AB (ref >90)
Glucose, Bld: 104 mg/dL — ABNORMAL HIGH (ref 70–99)
POTASSIUM: 4.1 mmol/L (ref 3.5–5.1)
Sodium: 140 mmol/L (ref 135–145)

## 2014-05-02 MED ORDER — CLOPIDOGREL BISULFATE 75 MG PO TABS
75.0000 mg | ORAL_TABLET | Freq: Every day | ORAL | Status: DC
Start: 2014-05-02 — End: 2014-05-27

## 2014-05-02 NOTE — Progress Notes (Signed)
Patient Name: Tamara Burton Date of Encounter: 05/02/2014  Principal Problem:   RAS (renal artery stenosis) Active Problems:   Hypertension   Primary Cardiologist: Dr. Fletcher Anon  Patient Profile: 81 yo female w/ hx OSA, HTN, HLD, carotid stenosis, s/p cath 01/20 w/ non-obs CAD and 95% L-RAS, had guiding catheter-induced dissection and the PCI L-RA was aborted. She returned for PCI to the L-RA on 01/27.  SUBJECTIVE: Currently feels well, BP has been up and down all night long, dropped into the 90s and then in the 160s.  OBJECTIVE Filed Vitals:   05/01/14 1928 05/01/14 2104 05/02/14 0006 05/02/14 0434  BP: 110/31 92/33 130/55 161/54  Pulse: 66  68   Temp: 98.5 F (36.9 C)  98.2 F (36.8 C) 97.9 F (36.6 C)  TempSrc: Oral  Oral Oral  Resp: 18  18 18   Height:      Weight:   126 lb 5.2 oz (57.3 kg)   SpO2: 95%  99% 98%    Intake/Output Summary (Last 24 hours) at 05/02/14 0703 Last data filed at 05/02/14 0130  Gross per 24 hour  Intake 1122.5 ml  Output    600 ml  Net  522.5 ml   Filed Weights   05/01/14 1116 05/02/14 0006  Weight: 124 lb 6 oz (56.416 kg) 126 lb 5.2 oz (57.3 kg)    PHYSICAL EXAM General: Well developed, well nourished, female in no acute distress. Head: Normocephalic, atraumatic.  Neck: Supple without bruits, JVD not elevated. Lungs:  Resp regular and unlabored, CTA. Heart: RRR, S1, S2, no S3, S4, or murmur; no rub. Abdomen: Soft, non-tender, non-distended, BS + x 4.  Extremities: No clubbing, cyanosis, no edema. Distal pulses intact, right groin cath site from last week is healing well. Left radial cath site from 01/27 without ecchymosis or hematoma Neuro: Alert and oriented X 3. Moves all extremities spontaneously. Psych: Normal affect.  LABS: CBC:  Recent Labs  05/01/14 1130  WBC 6.7  HGB 11.9*  HCT 35.0*  MCV 85.4  PLT 233   INR:  Recent Labs  05/01/14 1130  INR 2.75   Basic Metabolic Panel:  Recent Labs  05/01/14 1130  05/02/14 0515  NA 138 140  K 3.7 4.1  CL 102 106  CO2 26 28  GLUCOSE 111* 104*  BUN 21 15  CREATININE 0.92 0.83  CALCIUM 9.3 8.8    TELE:   Sinus rhythm, sinus tach     Peripheral cath: 05/01/2014 Results Conclusions: 1. Severe ostial left renal artery stenosis. 2. Successful 6 x 15 mm Herculink stent placement to the left renal artery Recommendations:  Monitor overnight for changes in blood pressure. Dual antiplatelet therapy is recommended for one month.  Radiology/Studies: No results found.   Current Medications:  . aspirin  81 mg Oral Daily  . carvedilol  6.25 mg Oral BID WC  . clopidogrel  75 mg Oral Q breakfast  . hydrALAZINE  50 mg Oral TID  . hydrochlorothiazide  12.5 mg Oral Daily  . losartan  50 mg Oral BID  . pantoprazole  40 mg Oral Q breakfast  . rosuvastatin  5 mg Oral q1800      ASSESSMENT AND PLAN: Active Problems:   RAS (renal artery stenosis) - S/P 6 x 15 mm Herculink stent to the left renal artery without complication. DAPT from for one month    Hypertension - continue current medications, patient encouraged to take blood pressure at home and call us if she  has any problems. Will arrange early follow-up in the office.  Plan: Discharge home  Signed, Rosaria Ferries , PA-C 7:03 AM 05/02/2014   Patient seen and examined. Agree with assessment and plan. Doing well; no chest pain or sob. Ecchymosis R groin from last week attempt; L radial site stable. DC today   Troy Sine, MD, [facility] 05/02/2014 9:56 AM

## 2014-05-02 NOTE — Discharge Instructions (Signed)
PLEASE REMEMBER TO BRING ALL OF YOUR MEDICATIONS TO EACH OF YOUR FOLLOW-UP OFFICE VISITS.  PLEASE ATTEND ALL SCHEDULED FOLLOW-UP APPOINTMENTS.   Activity: Increase activity slowly as tolerated. You may shower, but no soaking baths (or swimming) for 1 week. No driving for 2 days. No lifting over 5 lbs for 1 week. No sexual activity for 1 week.   You May Return to Work: in 1 week (if applicable)  Wound Care: You may wash cath site gently with soap and water. Keep cath site clean and dry. If you notice pain, swelling, bleeding or pus at your cath site, please call [phone].    Peripheral Cath Site Care Refer to this sheet in the next few weeks. These instructions provide you with information on caring for yourself after your procedure. Your caregiver may also give you more specific instructions. Your treatment has been planned according to current medical practices, but problems sometimes occur. Call your caregiver if you have any problems or questions after your procedure. HOME CARE INSTRUCTIONS  You may shower 24 hours after the procedure. Remove the bandage (dressing) and gently wash the site with plain soap and water. Gently pat the site dry.   Do not apply powder or lotion to the site.   Do not sit in a bathtub, swimming pool, or whirlpool for 5 to 7 days.   No bending, squatting, or lifting anything over 10 pounds (4.5 kg) as directed by your caregiver.   Inspect the site at least twice daily.   Do not drive home if you are discharged the same day of the procedure. Have someone else drive you.   You may drive 24 hours after the procedure unless otherwise instructed by your caregiver.  What to expect:  Any bruising will usually fade within 1 to 2 weeks.   Blood that collects in the tissue (hematoma) may be painful to the touch. It should usually decrease in size and tenderness within 1 to 2 weeks.  SEEK IMMEDIATE MEDICAL CARE IF:  You have unusual pain at the site or down the  affected limb.   You have redness, warmth, swelling, or pain at the site.   You have drainage (other than a small amount of blood on the dressing).   You have chills.   You have a fever or persistent symptoms for more than 72 hours.   You have a fever and your symptoms suddenly get worse.   Your leg becomes pale, cool, tingly, or numb.   You have heavy bleeding from the site. Hold pressure on the site.  Document Released: 04/24/2010 Document Revised: 03/11/2011 Document Reviewed: 04/24/2010 [facility] Patient Information 2012 [place].

## 2014-05-02 NOTE — Discharge Summary (Signed)
CARDIOLOGY DISCHARGE SUMMARY   Patient ID: Tamara Burton MRN: [id number] DOB/AGE: [birth year] 81 y.o.  Admit date: 05/01/2014 Discharge date: 05/02/2014  PCP: Rica Mast, MD Primary Cardiologist: Dr. Fletcher Anon  Primary Discharge Diagnosis:  Renal artery stenosis Secondary Discharge Diagnosis:    Hypertension   Renovascular hypertension   Recent Abnormal chest CT  Procedures Performed:  Arterial access via the left radial artery  Selective left renal artery angiography  Left renal artery stent placement  Hospital Course: Tamara Burton is a 81 y.o. female with a history of OSA, HTN, HLD, carotid stenosis, s/p cath 01/20 w/ non-obs CAD and 95% L-RAS, had guiding catheter-induced dissection when PCI of the L-RA was attempted, procedure aborted. She returned for PCI to the L-RA on 01/27.  Procedure results are below. She had a 6 x 15 mm Herculink stent inserted to the left renal artery without complication. She was admitted overnight.  On 05/02/2014 she was seen by Dr. Claiborne Billings and all data were reviewed. Her blood pressure had tipped overnight into the 90s and some of her p.m. medications were held. However, she was trending back up and is currently to remain on all of her previous home blood pressure medications. She is encouraged to check her blood pressure regularly and follow-up in the office.  Of note, a CT scan of the chest showed some abnormalities in her lower thoracic spine. She had a left lower lobe resection for lung cancer in 2003. She  was made aware of the abnormal results and will follow-up with her primary care physician for this.  She was ambulating without chest pain or shortness of breath. No further inpatient workup was recommended and she is considered stable for discharge, to follow-up as an outpatient.  Labs:   Lab Results  Component Value Date   WBC 6.7 05/01/2014   HGB 11.9* 05/01/2014   HCT 35.0* 05/01/2014   MCV 85.4 05/01/2014   PLT 233  05/01/2014     Recent Labs Lab 05/02/14 0515  NA 140  K 4.1  CL 106  CO2 28  BUN 15  CREATININE 0.83  CALCIUM 8.8  GLUCOSE 104*    Recent Labs  05/01/14 1130  INR 0.95      Radiology:  Ct Angio Chest Pe W/cm &/or Wo Cm 04/18/2014   CLINICAL DATA:  Acute onset of left-sided chest pain and back pain. Initial encounter.  EXAM: CT ANGIOGRAPHY CHEST WITH CONTRAST  TECHNIQUE: Multidetector CT imaging of the chest was performed using the standard protocol during bolus administration of intravenous contrast. Multiplanar CT image reconstructions and MIPs were obtained to evaluate the vascular anatomy.  CONTRAST:  128mL OMNIPAQUE IOHEXOL 350 MG/ML SOLN  COMPARISON:  Chest radiograph performed earlier today at 3:09 a.m.  FINDINGS: There is no evidence of pulmonary embolus.  The patient is status post left lower lobectomy. Mild scarring is noted at the posterior aspect of the left upper lobe, and at the left lung apex. Minimal bilateral atelectasis is seen. The lungs are otherwise clear. There is no evidence of significant focal consolidation, pleural effusion or pneumothorax. No masses are identified; no abnormal focal contrast enhancement is seen.  Dense calcification is seen at the mitral valve. The mediastinum is otherwise unremarkable. No mediastinal lymphadenopathy is seen. No pericardial effusion is identified. Scattered calcification is noted along the aortic arch and proximal great vessels. No axillary lymphadenopathy is seen. The visualized portions of the thyroid gland are unremarkable in appearance.  The visualized portions of the  liver and spleen are unremarkable. The visualized portions of the pancreas, gallbladder, stomach, adrenal glands and kidneys are within normal limits.  No acute osseous abnormalities are seen. Multilevel heterogeneous sclerotic change is noted along the lower thoracic spine, with associated vacuum phenomenon. Though this is likely degenerative, would correlate for  any associated symptoms to assess need for further evaluation.  Review of the MIP images confirms the above findings.  IMPRESSION: 1. No evidence of pulmonary embolus. 2. Status post left lower lobectomy. Mild scarring at the left lung. Minimal bilateral atelectasis seen. 3. Dense calcification at the mitral valve. 4. Multilevel heterogeneous sclerotic change along the lower thoracic spine, with associated vacuum phenomenon. Though this is likely degenerative, would correlate for any associated symptoms to assess need for further evaluation.   Electronically Signed   By: Garald Balding M.D.   On: 04/18/2014 06:29    Peripheral Vascular Cath: 05/01/2014 Procedural details: The left wrist was prepped and draped in a sterile fashion. It was anesthetized with 1% lidocaine. A 6 French slender sheath was placed in the left radial artery after an anterior puncture. 3 mg of verapamil was given. 4000 units of unfractionated heparin was given with an ACT above 2:30. A JR 4 guiding catheter was advanced over the versicolor wire into the ascending aorta. This engage the left renal artery. Renal artery angiography was performed which confirmed severe 95% proximal stenosis. The lesion was crossed with a stabilizer wire. It was direct stented with a 6 x 15 mm Herculink stent to 12 atm. The balloon was pulled back and inflated again to 14 atm. Final angiography showed excellent results with 10-20% residual stenosis and no pressure gradient by guiding catheter. Of note, There was severe pressure dampening with catheter engagement before stent placement. The patient tolerated the procedure well with no immediate complications. Conclusions: 1. Severe ostial left renal artery stenosis. 2. successful stent placement to the left renal artery Recommendations:  Monitor overnight for changes in blood pressure. Dual antiplatelet therapy is recommended for one month.  FOLLOW UP PLANS AND APPOINTMENTS Allergies  Allergen  Reactions  . Atorvastatin Other (See Comments)    Muscle weakness  . Penicillins Hives  . Sulfa Antibiotics Hives     Medication List    TAKE these medications        ALPRAZolam 0.25 MG tablet  Commonly known as:  XANAX  Take 1 tablet (0.25 mg total) by mouth every 8 (eight) hours as needed for anxiety.     aspirin 81 MG tablet  Take 81 mg by mouth daily.     carvedilol 6.25 MG tablet  Commonly known as:  COREG  Take 1 tablet (6.25 mg total) by mouth 2 (two) times daily with a meal.     Cholecalciferol 2000 UNITS Tabs  Take 1 tablet by mouth daily.     clopidogrel 75 MG tablet  Commonly known as:  PLAVIX  Take 1 tablet (75 mg total) by mouth daily with breakfast.     Fish Oil Oil  by Does not apply route.     fluticasone 50 MCG/ACT nasal spray  Commonly known as:  FLONASE  Place 1 spray into both nostrils daily as needed.     hydrALAZINE 50 MG tablet  Commonly known as:  APRESOLINE  Take 1 tablet (50 mg total) by mouth 3 (three) times daily.     hydrochlorothiazide 12.5 MG capsule  Commonly known as:  MICROZIDE  Take 1 capsule (12.5 mg total) by mouth daily.  losartan 50 MG tablet  Commonly known as:  COZAAR  Take 1 tablet (50 mg total) by mouth 2 (two) times daily.     Lysine HCl 500 MG Caps  Take 1,000 mg by mouth daily as needed (for cold sores).     pantoprazole 40 MG tablet  Commonly known as:  PROTONIX  Take 1 tablet (40 mg total) by mouth daily.     rosuvastatin 5 MG tablet  Commonly known as:  CRESTOR  Take 1 tablet (5 mg total) by mouth daily.     traZODone 50 MG tablet  Commonly known as:  DESYREL  Take 0.5-1 tablets (25-50 mg total) by mouth at bedtime as needed for sleep.     VITAMIN B COMPLEX PO  Take by mouth.     VITAMIN C CR 1500 MG Tbcr  Take by mouth daily.     Zinc 50 MG Tabs  Take by mouth daily.        Discharge Instructions    Diet - low sodium heart healthy    Complete by:  As directed      Increase activity  slowly    Complete by:  As directed           Follow-up Information    Follow up with Kathlyn Sacramento, MD On 05/06/2014.   Specialty:  Cardiology   Why:  at 10:30 AM   Contact information:   [address] [place] [address] [phone]       Follow up with Rica Mast, MD.   Specialty:  Internal Medicine   Contact information:   [address] [place] [address] [phone]       BRING [place] UP APPOINTMENTS  Time spent with patient to include physician time: 44 min Signed: Rosaria Ferries, PA-C 05/02/2014, 10:43 AM Co-Sign MD

## 2014-05-06 ENCOUNTER — Encounter: Payer: Self-pay | Admitting: Cardiovascular Disease

## 2014-05-06 ENCOUNTER — Ambulatory Visit (INDEPENDENT_AMBULATORY_CARE_PROVIDER_SITE_OTHER): Payer: Medicare Other | Admitting: Cardiovascular Disease

## 2014-05-06 VITALS — BP 148/62 | HR 69 | Ht 61.0 in | Wt 127.0 lb

## 2014-05-06 DIAGNOSIS — Z9889 Other specified postprocedural states: Secondary | ICD-10-CM

## 2014-05-06 DIAGNOSIS — E785 Hyperlipidemia, unspecified: Secondary | ICD-10-CM

## 2014-05-06 DIAGNOSIS — I150 Renovascular hypertension: Secondary | ICD-10-CM

## 2014-05-06 DIAGNOSIS — R0602 Shortness of breath: Secondary | ICD-10-CM

## 2014-05-06 MED ORDER — LOSARTAN POTASSIUM-HCTZ 100-25 MG PO TABS: 1.0000 | ORAL_TABLET | Freq: Every day | ORAL | Status: DC

## 2014-05-06 NOTE — Progress Notes (Signed)
Primary care physician: Dr. [name]  HPI  This is a 81 year old female who is here today for a follow-up visit regarding hypertension . She reports no previous cardiac history. She has known history of sleep apnea, carotid artery stenosis and hyperlipidemia . She was diagnosed with hypertension in November and blood pressure has been difficult to control. She was initially treated with amlodipine and hydrochlorothiazide. However, she started complaining of palpitations associated with an anxiety feeling. Thus, she was switched from both these medications to carvedilol. She reports resolution of palpitations since then. However, blood pressure continued to be elevated. I added losartan . She had an echocardiogram done which showed normal LV systolic function with moderate left ventricular hypertrophy and otherwise no significant valvular abnormalities.  Renal artery duplex showed bilateral renal artery stenosis. Around the same time, she started complaining of substernal chest tightness radiating to her back with associated shortness of breath. I proceeded with cardiac catheterization on January 20 which showed no significant coronary artery disease with normal left ventricular end-diastolic pressure. Renal angiography showed severe left renal artery stenosis and 40% ostial right renal artery stenosis. Attempted left renal artery stenting was complicated by guiding catheter-induced dissection in the distal aorta. The procedure was aborted. The patient remained hemodynamically stable.  I then proceeded with left renal artery stenting via the left radial artery without complications. Blood pressure improved after the procedure but still labile.   Allergies  Allergen Reactions  . Atorvastatin Other (See Comments)    Muscle weakness  . Penicillins Hives  . Sulfa Antibiotics Hives     Current Outpatient Prescriptions on File Prior to Visit  Medication Sig Dispense Refill  . ALPRAZolam (XANAX)  0.25 MG tablet Take 1 tablet (0.25 mg total) by mouth every 8 (eight) hours as needed for anxiety. 40 tablet 0  . Ascorbic Acid (VITAMIN C CR) 1500 MG TBCR Take by mouth daily.    Marland Kitchen aspirin 81 MG tablet Take 81 mg by mouth daily.    . B Complex Vitamins (VITAMIN B COMPLEX PO) Take by mouth.    . carvedilol (COREG) 6.25 MG tablet Take 1 tablet (6.25 mg total) by mouth 2 (two) times daily with a meal. 60 tablet 3  . Cholecalciferol 2000 UNITS TABS Take 1 tablet by mouth daily.    . clopidogrel (PLAVIX) 75 MG tablet Take 1 tablet (75 mg total) by mouth daily with breakfast. 30 tablet 0  . Fish Oil OIL by Does not apply route.    . fluticasone (FLONASE) 50 MCG/ACT nasal spray Place 1 spray into both nostrils daily as needed.     Marland Kitchen Lysine HCl 500 MG CAPS Take 1,000 mg by mouth daily as needed (for cold sores).     . pantoprazole (PROTONIX) 40 MG tablet Take 1 tablet (40 mg total) by mouth daily. 30 tablet 11  . rosuvastatin (CRESTOR) 5 MG tablet Take 1 tablet (5 mg total) by mouth daily. 90 tablet 1  . traZODone (DESYREL) 50 MG tablet Take 0.5-1 tablets (25-50 mg total) by mouth at bedtime as needed for sleep. 90 tablet 0  . Zinc 50 MG TABS Take by mouth daily.     No current facility-administered medications on file prior to visit.     Past Medical History  Diagnosis Date  . Hyperlipidemia   . Orthostatic hypotension   . Carotid artery disease   . PONV (postoperative nausea and vomiting)     "when I was younger"  . Hypertension   .  OSA on CPAP     "wear it intermittently"  . Sinus headache   . Headache     "lately; related to HTN" (04/24/2014)  . Arthritis     "qwhere"  . Chronic lower back pain   . Lung cancer 2003    s/p left lower lobe resection  . Non-obstructive CAD     a. 04/2014 Cath: LM nl, LAD/D1/LCX/RCA min irregs, RPDA/PAV nl.  . Renal artery stenosis     a. 04/2014 Renal angiography: LRA 95p, RRA 40ost. PTA of LRA deferred 2/2 guide cath induced Ao dissection;  01/27 6 x  15 mm Herculink stent to the L-RA      Past Surgical History  Procedure Laterality Date  . Appendectomy    . Total hip arthroplasty Right   . Replacement total knee Left   . Bilateral oophorectomy Bilateral 2000's  . Vaginal delivery  X 2  . Cataract extraction w/ intraocular lens  implant, bilateral Bilateral   . Cardiac catheterization  04/24/2014  . Renal angiogram  04/24/2014  . Joint replacement    . Vaginal hysterectomy  1980's  . Lung lobectomy Left 2003    "lower lobe"  . Tubal ligation  ~ 1975  . Left heart catheterization with coronary angiogram N/A 04/24/2014    Procedure: LEFT HEART CATHETERIZATION WITH CORONARY ANGIOGRAM;  Surgeon: Wellington Hampshire, MD;  Location: [facility];  Service: Cardiovascular;  Laterality: N/A;  . Renal angiogram N/A 04/24/2014    Procedure: RENAL ANGIOGRAM;  Surgeon: Wellington Hampshire, MD;  Location: [facility];  Service: Cardiovascular;  Laterality: N/A;  . Renal angiogram Left 05/01/2014    Procedure: RENAL ANGIOGRAM;  Surgeon: Wellington Hampshire, MD;  Location: [facility];  Service: Cardiovascular;  Laterality: Left;  . Peripheral vascular catheterization  05/01/2014    Procedure: RENAL INTERVENTION;  Surgeon: Wellington Hampshire, MD; 6 x 15 mm Herculink stent to the Left Renal Artery     Family History  Problem Relation Age of Onset  . Parkinsonism Mother   . Sarcoidosis Brother   . Stroke Maternal Grandmother   . Cancer Paternal Grandmother      History   Social History  . Marital Status: Widowed    Spouse Name: N/A    Number of Children: N/A  . Years of Education: N/A   Occupational History  . Not on file.   Social History Main Topics  . Smoking status: Never Smoker   . Smokeless tobacco: Never Used  . Alcohol Use: Yes     Comment: 04/24/2014 "glass of beer or wine q couple weeks"  . Drug Use: No  . Sexual Activity: Not on file     Comment: Father and husband both smokers   Other Topics Concern  . Not on file   Social  History Narrative   Lives in [place]. Daughter nearby.     ROS A 10 point review of system was performed. It is negative other than that mentioned in the history of present illness.   PHYSICAL EXAM   BP 148/62 mmHg  Pulse 69  Ht 5\' 1"  (1.549 m)  Wt 127 lb (57.607 kg)  BMI 24.01 kg/m2 Constitutional: She is oriented to person, place, and time. She appears well-developed and well-nourished. No distress.  HENT: No nasal discharge.  Head: Normocephalic and atraumatic.  Eyes: Pupils are equal and round. No discharge.  Neck: Normal range of motion. Neck supple. No JVD present. No thyromegaly present.  Cardiovascular: Normal  rate, regular rhythm, normal heart sounds. Exam reveals no gallop and no friction rub. No murmur heard.  Pulmonary/Chest: Effort normal and breath sounds normal. No stridor. No respiratory distress. She has no wheezes. She has no rales. She exhibits no tenderness.  Abdominal: Soft. Bowel sounds are normal. She exhibits no distension. There is no tenderness. There is no rebound and no guarding.  Musculoskeletal: Normal range of motion. She exhibits no edema and no tenderness.  Neurological: She is alert and oriented to person, place, and time. Coordination normal.  Skin: Skin is warm and dry. No rash noted. She is not diaphoretic. No erythema. No pallor.  Psychiatric: She has a normal mood and affect. Her behavior is normal. Judgment and thought content normal.   WUG:QBVQX  Rhythm  Voltage criteria for LVH  (R(I)+S(III) exceeds 2.50 mV)  -Voltage criteria w/o ST/T abnormality may be normal.   -Poor R-wave progression -nonspecific -consider old anterior infarct.   BORDERLINE   ASSESSMENT AND PLAN

## 2014-05-06 NOTE — Patient Instructions (Signed)
Stop Hydralazine.  Stop Losartan and Hydrochlorothiazide.   Start Losartan/Hydrochlorothiazide 100/25 mg once daily.   Follow up in 1 month.

## 2014-05-12 NOTE — Assessment & Plan Note (Signed)
Continue treatment with small dose rosuvastatin. Target LDL is less than 100.

## 2014-05-12 NOTE — Assessment & Plan Note (Signed)
This was associated with chest pain. Cardiac catheterization showed nonobstructive coronary artery disease. Symptoms were likely related to uncontrolled hypertension.

## 2014-05-12 NOTE — Assessment & Plan Note (Signed)
Blood pressure improved after stenting of severe left renal artery stenosis. In order to simplify her medications, I discontinued hydralazine and combined with losartan with hydrochlorothiazide with increasing the dose to 100/25 mg once daily. Continue carvedilol. Check renal artery duplex in 3 months. Follow-up in one month.

## 2014-05-13 ENCOUNTER — Ambulatory Visit (INDEPENDENT_AMBULATORY_CARE_PROVIDER_SITE_OTHER): Payer: Medicare PPO | Admitting: Internal Medicine

## 2014-05-13 ENCOUNTER — Encounter: Payer: Self-pay | Admitting: Internal Medicine

## 2014-05-13 VITALS — BP 168/70 | HR 66 | Temp 97.9°F | Ht 61.0 in | Wt 128.8 lb

## 2014-05-13 DIAGNOSIS — R9389 Abnormal findings on diagnostic imaging of other specified body structures: Secondary | ICD-10-CM

## 2014-05-13 DIAGNOSIS — M546 Pain in thoracic spine: Secondary | ICD-10-CM

## 2014-05-13 DIAGNOSIS — I150 Renovascular hypertension: Secondary | ICD-10-CM

## 2014-05-13 DIAGNOSIS — R938 Abnormal findings on diagnostic imaging of other specified body structures: Secondary | ICD-10-CM

## 2014-05-13 DIAGNOSIS — M549 Dorsalgia, unspecified: Secondary | ICD-10-CM | POA: Insufficient documentation

## 2014-05-13 LAB — CBC WITH DIFFERENTIAL/PLATELET
Basophils Absolute: 0 10*3/uL (ref 0.0–0.1)
Basophils Relative: 0.7 % (ref 0.0–3.0)
Eosinophils Absolute: 0.1 10*3/uL (ref 0.0–0.7)
Eosinophils Relative: 2 % (ref 0.0–5.0)
HCT: 34.4 % — ABNORMAL LOW (ref 36.0–46.0)
Hemoglobin: 11.9 g/dL — ABNORMAL LOW (ref 12.0–15.0)
Lymphocytes Relative: 29.5 % (ref 12.0–46.0)
Lymphs Abs: 1.7 10*3/uL (ref 0.7–4.0)
MCHC: 34.5 g/dL (ref 30.0–36.0)
MCV: 84.9 fl (ref 78.0–100.0)
Monocytes Absolute: 0.5 10*3/uL (ref 0.1–1.0)
Monocytes Relative: 8.6 % (ref 3.0–12.0)
Neutro Abs: 3.3 10*3/uL (ref 1.4–7.7)
Neutrophils Relative %: 59.2 % (ref 43.0–77.0)
Platelets: 312 10*3/uL (ref 150.0–400.0)
RBC: 4.06 Mil/uL (ref 3.87–5.11)
RDW: 13.3 % (ref 11.5–15.5)
WBC: 5.7 10*3/uL (ref 4.0–10.5)

## 2014-05-13 LAB — COMPREHENSIVE METABOLIC PANEL
ALT: 33 U/L (ref 0–35)
AST: 23 U/L (ref 0–37)
Albumin: 4 g/dL (ref 3.5–5.2)
Alkaline Phosphatase: 174 U/L — ABNORMAL HIGH (ref 39–117)
BILIRUBIN TOTAL: 0.3 mg/dL (ref 0.2–1.2)
BUN: 21 mg/dL (ref 6–23)
CO2: 31 mEq/L (ref 19–32)
Calcium: 9.3 mg/dL (ref 8.4–10.5)
Chloride: 101 mEq/L (ref 96–112)
Creatinine, Ser: 0.94 mg/dL (ref 0.40–1.20)
GFR: 61.09 mL/min (ref >60.00)
Glucose, Bld: 105 mg/dL — ABNORMAL HIGH (ref 70–99)
Potassium: 4.7 mEq/L (ref 3.5–5.1)
SODIUM: 138 meq/L (ref 135–145)
Total Protein: 6.3 g/dL (ref 6.0–8.3)

## 2014-05-13 NOTE — Patient Instructions (Signed)
Labs today.  Continue Tylenol as needed for pain. If pain persistent, please call and we will consider Tramadol.  Follow up in 4 weeks.

## 2014-05-13 NOTE — Assessment & Plan Note (Signed)
BP improved after renal artery stenting. Will continue to monitor. Follow up with Dr. Fletcher Anon in 06/2013.

## 2014-05-13 NOTE — Assessment & Plan Note (Signed)
Recent abnormal Chest CT with sclerotic lesions seen in lower thoracic spine. Likely related to DJD. However, will send labs to evaluate for multiple myeloma.

## 2014-05-13 NOTE — Progress Notes (Signed)
Pre visit review using our clinic review tool, if applicable. No additional management support is needed unless otherwise documented below in the visit note. 

## 2014-05-13 NOTE — Assessment & Plan Note (Signed)
Likely muscular strain. Will continue prn tylenol. Reviewed CT chest which showed some DJD in mid thoracic spine. If pain not improving, discussed adding Tramadol and referral to Dr. Sharlet Salina.

## 2014-05-13 NOTE — Progress Notes (Signed)
Subjective:    Patient ID: Tamara Burton, female    DOB: Sep 29, 1933, 81 y.o.   MRN: [id number]  HPI  81YO female presents for follow up.  HTN - Recently underwent attempt at renal artery stenting. Procedure was complicated by dissection of the aorta, however stent was placed in left renal artery. Dose of Losartan was increased. BP at home has been typical 962I-297L systolic. BP Readings from Last 3 Encounters:  05/13/14 168/70  05/06/14 148/62  05/02/14 136/43     Having pain in left mid back off and on for weeks. Takes Tylenol Arthritis 2gm per day with some improvement.  Past medical, surgical, family and social history per today's encounter.  Review of Systems  Constitutional: Negative for fever, chills, appetite change, fatigue and unexpected weight change.  Eyes: Negative for visual disturbance.  Respiratory: Negative for cough, chest tightness and shortness of breath.   Cardiovascular: Negative for chest pain and leg swelling.  Gastrointestinal: Negative for abdominal pain, diarrhea and constipation.  Musculoskeletal: Positive for myalgias, back pain and arthralgias.  Skin: Negative for color change and rash.  Hematological: Negative for adenopathy. Does not bruise/bleed easily.  Psychiatric/Behavioral: Negative for sleep disturbance and dysphoric mood. The patient is not nervous/anxious.        Objective:    BP 168/70 mmHg  Pulse 66  Temp(Src) 97.9 F (36.6 C) (Oral)  Ht 5' 1" (1.549 m)  Wt 128 lb 12 oz (58.401 kg)  BMI 24.34 kg/m2  SpO2 98% Physical Exam  Constitutional: She is oriented to person, place, and time. She appears well-developed and well-nourished. No distress.  HENT:  Head: Normocephalic and atraumatic.  Right Ear: External ear normal.  Left Ear: External ear normal.  Nose: Nose normal.  Mouth/Throat: Oropharynx is clear and moist. No oropharyngeal exudate.  Eyes: Conjunctivae are normal. Pupils are equal, round, and reactive to light. Right  eye exhibits no discharge. Left eye exhibits no discharge. No scleral icterus.  Neck: Normal range of motion. Neck supple. No tracheal deviation present. No thyromegaly present.  Cardiovascular: Normal rate, regular rhythm, normal heart sounds and intact distal pulses.  Exam reveals no gallop and no friction rub.   No murmur heard. Pulmonary/Chest: Effort normal and breath sounds normal. No respiratory distress. She has no wheezes. She has no rales. She exhibits no tenderness.  Musculoskeletal: Normal range of motion. She exhibits no edema.       Thoracic back: She exhibits tenderness and pain. She exhibits no swelling and no deformity.       Back:  Lymphadenopathy:    She has no cervical adenopathy.  Neurological: She is alert and oriented to person, place, and time. No cranial nerve deficit. She exhibits normal muscle tone. Coordination normal.  Skin: Skin is warm and dry. No rash noted. She is not diaphoretic. No erythema. No pallor.  Psychiatric: She has a normal mood and affect. Her behavior is normal. Judgment and thought content normal.          Assessment & Plan:   Problem List Items Addressed This Visit      Unprioritized   Abnormal chest CT - Primary    Recent abnormal Chest CT with sclerotic lesions seen in lower thoracic spine. Likely related to DJD. However, will send labs to evaluate for multiple myeloma.      Relevant Orders   Comprehensive metabolic panel   Serum protein electrophoresis with reflex   Protein Electrophoresis, Urine Rflx.   CBC w/Diff   Back pain  Likely muscular strain. Will continue prn tylenol. Reviewed CT chest which showed some DJD in mid thoracic spine. If pain not improving, discussed adding Tramadol and referral to Dr. Chasnis.      Renovascular hypertension    BP improved after renal artery stenting. Will continue to monitor. Follow up with Dr. Arida in 06/2013.          Return in about 4 weeks (around 06/10/2014) for Recheck.   

## 2014-05-15 LAB — PROTEIN ELECTROPHORESIS, SERUM, WITH REFLEX
ALPHA-1-GLOBULIN: 5.6 % — AB (ref 2.9–4.9)
Albumin ELP: 59.1 % (ref 55.8–66.1)
Alpha-2-Globulin: 13.8 % — ABNORMAL HIGH (ref 7.1–11.8)
BETA 2: 5 % (ref 3.2–6.5)
Beta Globulin: 6.5 % (ref 4.7–7.2)
GAMMA GLOBULIN: 10 % — AB (ref 11.1–18.8)
TOTAL PROTEIN, SERUM ELECTROPHOR: 6.4 g/dL (ref 6.0–8.3)

## 2014-05-20 LAB — PROTEIN ELECTROPHORESIS, URINE REFLEX: Total Protein, Urine: <4 mg/dL

## 2014-05-27 ENCOUNTER — Other Ambulatory Visit: Payer: Self-pay | Admitting: *Deleted

## 2014-05-27 ENCOUNTER — Telehealth: Payer: Self-pay | Admitting: *Deleted

## 2014-05-27 MED ORDER — CLOPIDOGREL BISULFATE 75 MG PO TABS: 75.0000 mg | ORAL_TABLET | Freq: Every day | ORAL | Status: DC

## 2014-05-27 NOTE — Telephone Encounter (Signed)
Rx sent to local pharmacy.  

## 2014-05-27 NOTE — Telephone Encounter (Signed)
°  1. Which medications need to be refilled? Plavix, the generic one  2. [facility]   3. Do they need a 30 day or 90 day supply? Was not sure  4. Would they like a call back once the medication has been sent to the pharmacy? Yes, so she can go get it.

## 2014-05-28 ENCOUNTER — Other Ambulatory Visit: Payer: Self-pay | Admitting: *Deleted

## 2014-05-28 DIAGNOSIS — G4700 Insomnia, unspecified: Secondary | ICD-10-CM

## 2014-05-28 MED ORDER — LOSARTAN POTASSIUM-HCTZ 100-25 MG PO TABS: 1.0000 | ORAL_TABLET | Freq: Every day | ORAL | Status: DC

## 2014-05-28 MED ORDER — CARVEDILOL 6.25 MG PO TABS: 6.2500 mg | ORAL_TABLET | Freq: Two times a day (BID) | ORAL | Status: DC

## 2014-05-28 MED ORDER — CLOPIDOGREL BISULFATE 75 MG PO TABS: 75.0000 mg | ORAL_TABLET | Freq: Every day | ORAL | Status: DC

## 2014-05-28 MED ORDER — PANTOPRAZOLE SODIUM 40 MG PO TBEC: 40.0000 mg | DELAYED_RELEASE_TABLET | Freq: Every day | ORAL | Status: DC

## 2014-05-28 NOTE — Telephone Encounter (Signed)
Okay to refill? Pt had filled last by [facility] & last Rx was approved on 03/20/14 for #[id number] with no refills. Please advise

## 2014-05-29 MED ORDER — TRAZODONE HCL 50 MG PO TABS: 25.0000 mg | ORAL_TABLET | Freq: Every evening | ORAL | Status: DC | PRN

## 2014-06-10 ENCOUNTER — Ambulatory Visit (INDEPENDENT_AMBULATORY_CARE_PROVIDER_SITE_OTHER): Payer: Medicare PPO | Admitting: Cardiovascular Disease

## 2014-06-10 ENCOUNTER — Encounter: Payer: Self-pay | Admitting: Cardiovascular Disease

## 2014-06-10 VITALS — BP 160/80 | HR 68 | Ht 61.0 in | Wt 127.5 lb

## 2014-06-10 DIAGNOSIS — I701 Atherosclerosis of renal artery: Secondary | ICD-10-CM

## 2014-06-10 DIAGNOSIS — E785 Hyperlipidemia, unspecified: Secondary | ICD-10-CM

## 2014-06-10 DIAGNOSIS — I150 Renovascular hypertension: Secondary | ICD-10-CM

## 2014-06-10 NOTE — Assessment & Plan Note (Signed)
Blood pressure has improved significantly after left renal artery stenting. I recommend continuing current medications. We can consider adding spironolactone in the future if needed. I requested renal artery duplex ultrasound to be done in 3 months. She can stop Plavix after finishing current supply.

## 2014-06-10 NOTE — Progress Notes (Signed)
Primary care physician: Dr. [name]  HPI  This is a 81 year old female who is here today for a follow-up visit regarding hypertension . She reports no previous cardiac history. She has known history of sleep apnea, carotid artery stenosis and hyperlipidemia . She was diagnosed with hypertension in November and blood pressure has been difficult to control. She was initially treated with amlodipine and hydrochlorothiazide. However, she started complaining of palpitations associated with an anxiety feeling. Thus, she was switched from both these medications to carvedilol. She reports resolution of palpitations since then. However, blood pressure continued to be elevated. I added losartan . She had an echocardiogram done which showed normal LV systolic function with moderate left ventricular hypertrophy and otherwise no significant valvular abnormalities.  Renal artery duplex showed bilateral renal artery stenosis. Around the same time, she started complaining of substernal chest tightness radiating to her back with associated shortness of breath. I proceeded with cardiac catheterization on January 20 which showed no significant coronary artery disease with normal left ventricular end-diastolic pressure. Renal angiography showed severe left renal artery stenosis and 40% ostial right renal artery stenosis. Attempted left renal artery stenting was complicated by guiding catheter-induced dissection in the distal aorta. The procedure was aborted. The patient remained hemodynamically stable.  I then proceeded with left renal artery stenting via the left radial artery without complications. Blood pressure improved after the procedure but still labile. She is doing well overall and denies chest pain or shortness of breath. Home blood pressure readings are significantly improved from before.   Allergies  Allergen Reactions  . Atorvastatin Other (See Comments)    Muscle weakness  . Penicillins Hives  .  Sulfa Antibiotics Hives     Current Outpatient Prescriptions on File Prior to Visit  Medication Sig Dispense Refill  . Ascorbic Acid (VITAMIN C CR) 1500 MG TBCR Take by mouth daily.    Marland Kitchen aspirin 81 MG tablet Take 81 mg by mouth daily.    . B Complex Vitamins (VITAMIN B COMPLEX PO) Take by mouth.    . carvedilol (COREG) 6.25 MG tablet Take 1 tablet (6.25 mg total) by mouth 2 (two) times daily with a meal. 180 tablet 3  . Cholecalciferol 2000 UNITS TABS Take 1 tablet by mouth daily.    . clopidogrel (PLAVIX) 75 MG tablet Take 1 tablet (75 mg total) by mouth daily with breakfast. 90 tablet 3  . Fish Oil OIL by Does not apply route.    . fluticasone (FLONASE) 50 MCG/ACT nasal spray Place 1 spray into both nostrils daily as needed.     Marland Kitchen losartan-hydrochlorothiazide (HYZAAR) 100-25 MG per tablet Take 1 tablet by mouth daily. 90 tablet 3  . Lysine HCl 500 MG CAPS Take 1,000 mg by mouth daily as needed (for cold sores).     . pantoprazole (PROTONIX) 40 MG tablet Take 1 tablet (40 mg total) by mouth daily. 90 tablet 3  . rosuvastatin (CRESTOR) 5 MG tablet Take 1 tablet (5 mg total) by mouth daily. 90 tablet 1  . traZODone (DESYREL) 50 MG tablet Take 0.5-1 tablets (25-50 mg total) by mouth at bedtime as needed for sleep. 90 tablet 0  . Zinc 50 MG TABS Take by mouth daily.     No current facility-administered medications on file prior to visit.     Past Medical History  Diagnosis Date  . Hyperlipidemia   . Orthostatic hypotension   . Carotid artery disease   . PONV (postoperative nausea and vomiting)     "  when I was younger"  . Hypertension   . OSA on CPAP     "wear it intermittently"  . Sinus headache   . Headache     "lately; related to HTN" (04/24/2014)  . Arthritis     "qwhere"  . Chronic lower back pain   . Lung cancer 2003    s/p left lower lobe resection  . Non-obstructive CAD     a. 04/2014 Cath: LM nl, LAD/D1/LCX/RCA min irregs, RPDA/PAV nl.  . Renal artery stenosis     a.  04/2014 Renal angiography: LRA 95p, RRA 40ost. PTA of LRA deferred 2/2 guide cath induced Ao dissection;  01/27 6 x 15 mm Herculink stent to the L-RA      Past Surgical History  Procedure Laterality Date  . Appendectomy    . Total hip arthroplasty Right   . Replacement total knee Left   . Bilateral oophorectomy Bilateral 2000's  . Vaginal delivery  X 2  . Cataract extraction w/ intraocular lens  implant, bilateral Bilateral   . Cardiac catheterization  04/24/2014  . Renal angiogram  04/24/2014  . Joint replacement    . Vaginal hysterectomy  1980's  . Lung lobectomy Left 2003    "lower lobe"  . Tubal ligation  ~ 1975  . Left heart catheterization with coronary angiogram N/A 04/24/2014    Procedure: LEFT HEART CATHETERIZATION WITH CORONARY ANGIOGRAM;  Surgeon: Wellington Hampshire, MD;  Location: [facility];  Service: Cardiovascular;  Laterality: N/A;  . Renal angiogram N/A 04/24/2014    Procedure: RENAL ANGIOGRAM;  Surgeon: Wellington Hampshire, MD;  Location: [facility];  Service: Cardiovascular;  Laterality: N/A;  . Renal angiogram Left 05/01/2014    Procedure: RENAL ANGIOGRAM;  Surgeon: Wellington Hampshire, MD;  Location: [facility];  Service: Cardiovascular;  Laterality: Left;  . Peripheral vascular catheterization  05/01/2014    Procedure: RENAL INTERVENTION;  Surgeon: Wellington Hampshire, MD; 6 x 15 mm Herculink stent to the Left Renal Artery     Family History  Problem Relation Age of Onset  . Parkinsonism Mother   . Sarcoidosis Brother   . Stroke Maternal Grandmother   . Cancer Paternal Grandmother      History   Social History  . Marital Status: Widowed    Spouse Name: N/A  . Number of Children: N/A  . Years of Education: N/A   Occupational History  . Not on file.   Social History Main Topics  . Smoking status: Never Smoker   . Smokeless tobacco: Never Used  . Alcohol Use: Yes     Comment: 04/24/2014 "glass of beer or wine q couple weeks"  . Drug Use: No  . Sexual  Activity: Not on file     Comment: Father and husband both smokers   Other Topics Concern  . Not on file   Social History Narrative   Lives in [place]. Daughter nearby.     ROS A 10 point review of system was performed. It is negative other than that mentioned in the history of present illness.   PHYSICAL EXAM   BP 160/80 mmHg  Pulse 68  Ht 5\' 1"  (1.549 m)  Wt 127 lb 8 oz (57.834 kg)  BMI 24.10 kg/m2 Constitutional: She is oriented to person, place, and time. She appears well-developed and well-nourished. No distress.  HENT: No nasal discharge.  Head: Normocephalic and atraumatic.  Eyes: Pupils are equal and round. No discharge.  Neck: Normal range of motion.  Neck supple. No JVD present. No thyromegaly present.  Cardiovascular: Normal rate, regular rhythm, normal heart sounds. Exam reveals no gallop and no friction rub. No murmur heard.  Pulmonary/Chest: Effort normal and breath sounds normal. No stridor. No respiratory distress. She has no wheezes. She has no rales. She exhibits no tenderness.  Abdominal: Soft. Bowel sounds are normal. She exhibits no distension. There is no tenderness. There is no rebound and no guarding.  Musculoskeletal: Normal range of motion. She exhibits no edema and no tenderness.  Neurological: She is alert and oriented to person, place, and time. Coordination normal.  Skin: Skin is warm and dry. No rash noted. She is not diaphoretic. No erythema. No pallor.  Psychiatric: She has a normal mood and affect. Her behavior is normal. Judgment and thought content normal.      ASSESSMENT AND PLAN

## 2014-06-10 NOTE — Patient Instructions (Signed)
Stop taking Plavix after finishing current supply.   Your physician has requested that you have a renal artery duplex. During this test, an ultrasound is used to evaluate blood flow to the kidneys. Allow one hour for this exam. Do not eat after midnight the day before and avoid carbonated beverages. Take your medications as you usually do.  Schedule renal artery duplex and follow up in 3 months. (same day).

## 2014-06-10 NOTE — Assessment & Plan Note (Signed)
Lab Results  Component Value Date   CHOL 198 02/12/2014   HDL 65.30 02/12/2014   LDLCALC 116* 02/12/2014   LDLDIRECT 201.7 02/05/2013   TRIG 86.0 02/12/2014   CHOLHDL 3 02/12/2014   She is currently on small dose rosuvastatin.

## 2014-06-12 ENCOUNTER — Other Ambulatory Visit: Payer: Self-pay

## 2014-06-12 MED ORDER — CARVEDILOL 6.25 MG PO TABS: 6.2500 mg | ORAL_TABLET | Freq: Two times a day (BID) | ORAL | Status: DC

## 2014-06-12 MED ORDER — PANTOPRAZOLE SODIUM 40 MG PO TBEC: 40.0000 mg | DELAYED_RELEASE_TABLET | Freq: Every day | ORAL | Status: DC

## 2014-06-12 MED ORDER — LOSARTAN POTASSIUM-HCTZ 100-25 MG PO TABS: 1.0000 | ORAL_TABLET | Freq: Every day | ORAL | Status: DC

## 2014-06-12 NOTE — Telephone Encounter (Signed)
Refill sent for pantoprazole 40 mg  

## 2014-06-12 NOTE — Telephone Encounter (Signed)
Refill losartan hctz 100/25 mg and carvedilol 6.25

## 2014-06-13 ENCOUNTER — Ambulatory Visit: Payer: Medicare PPO | Admitting: Internal Medicine

## 2014-06-17 ENCOUNTER — Encounter: Payer: Self-pay | Admitting: Internal Medicine

## 2014-06-17 ENCOUNTER — Ambulatory Visit: Payer: Medicare PPO | Admitting: Internal Medicine

## 2014-06-17 MED ORDER — ROSUVASTATIN CALCIUM 5 MG PO TABS: 5.0000 mg | ORAL_TABLET | Freq: Every day | ORAL | Status: DC

## 2014-07-04 ENCOUNTER — Other Ambulatory Visit: Payer: Self-pay | Admitting: Nurse Practitioner

## 2014-07-15 ENCOUNTER — Encounter: Payer: Self-pay | Admitting: Internal Medicine

## 2014-07-15 ENCOUNTER — Ambulatory Visit (INDEPENDENT_AMBULATORY_CARE_PROVIDER_SITE_OTHER): Payer: Medicare PPO | Admitting: Internal Medicine

## 2014-07-15 ENCOUNTER — Encounter: Payer: Self-pay | Admitting: Physician Assistant

## 2014-07-15 ENCOUNTER — Ambulatory Visit (INDEPENDENT_AMBULATORY_CARE_PROVIDER_SITE_OTHER): Payer: Medicare PPO | Admitting: Physician Assistant

## 2014-07-15 ENCOUNTER — Telehealth: Payer: Self-pay | Admitting: *Deleted

## 2014-07-15 VITALS — BP 120/60 | HR 87 | Ht 61.0 in | Wt 125.0 lb

## 2014-07-15 VITALS — BP 208/61 | HR 82 | Temp 98.1°F | Ht 61.0 in | Wt 126.4 lb

## 2014-07-15 DIAGNOSIS — I160 Hypertensive urgency: Secondary | ICD-10-CM

## 2014-07-15 DIAGNOSIS — I150 Renovascular hypertension: Secondary | ICD-10-CM | POA: Diagnosis not present

## 2014-07-15 DIAGNOSIS — I701 Atherosclerosis of renal artery: Secondary | ICD-10-CM

## 2014-07-15 DIAGNOSIS — I10 Essential (primary) hypertension: Secondary | ICD-10-CM | POA: Diagnosis not present

## 2014-07-15 DIAGNOSIS — I6529 Occlusion and stenosis of unspecified carotid artery: Secondary | ICD-10-CM | POA: Diagnosis not present

## 2014-07-15 DIAGNOSIS — E785 Hyperlipidemia, unspecified: Secondary | ICD-10-CM

## 2014-07-15 MED ORDER — HYDRALAZINE HCL 10 MG PO TABS: 10.0000 mg | ORAL_TABLET | Freq: Three times a day (TID) | ORAL | Status: DC

## 2014-07-15 NOTE — Progress Notes (Signed)
Cardiology Office Note:  Date of Encounter: 07/15/2014  ID: Tamara Burton, DOB 11-26-1933, MRN [id number]  PCP:  Rica Mast, MD Primary Cardiologist:  Dr. Fletcher Anon, MD  Chief Complaint  Patient presents with  . other    Pt. c/o elevated BP/discuss renal artery stent. Meds reviewed by the patient verbally.     HPI:  81 year old female with history of nonobstructive CAD by cardiac cath 04/2014, carotid artery stenosis, severe left renal artery stenosis s/p stenting 05/01/2014 and 40% ostial right renal stenosis, renovascular HTN, lung CA s/p left lower lobe resection, HLD, and OSA on intermittent CPAP who just saw her PCP today for follow up of her HTN and was noted to have elevated blood pressure at that time of 208/61 and was advised to go to the ED for further evaluation. She presents for follow up of the above.   She was diagnosed with hypertension in November of 2015, though review of her chart indicates some readings prior to that into the 517O systolic, and blood pressure has been difficult to control. She was initially treated with amlodipine and hydrochlorothiazide. However, she started complaining of palpitations associated with an anxiety feeling. Thus, she was switched from both these medications to carvedilol. She reports resolution of palpitations since then. However, blood pressure continued to be elevated and losartan was added. She had an echocardiogram done on 04/01/2014 which showed normal LV systolic function with moderate left ventricular hypertrophy and otherwise no significant valvular abnormalities.    Renal artery duplex showed bilateral renal artery stenosis. Around the same time, she started complaining of substernal chest tightness radiating to her back with associated shortness of breath.  Thus she proceeded with cardiac catheterization on 04/24/2014 which showed no significant coronary artery disease with normal left ventricular end-diastolic pressure.  Renal angiography showed severe left renal artery stenosis and 40% ostial right renal artery stenosis. Attempted left renal artery stenting was complicated by guiding catheter-induced dissection in the distal aorta. The procedure was aborted. The patient remained hemodynamically stable.   She then underwent left renal artery stenting via the left radial artery without complications. Blood pressure improved after the procedure but has remained labile. At her last follow up with Dr. Fletcher Anon on 06/10/2014 she was doing well and advised to stop her Plavix after finishing her current bottle. She was scheduled for a repeat renal duplex in June 2016 at that time (BP 160/80).   She presented to her PCP today with BP 208/61. She has stopped her Plavix x 2 weeks. She was advised to go to the ED for concern of possible instent restenosis of the left renal artery stent and 2/2 her chest pain.   She comes in this afternoon as a walk-in with a blood pressure of 120/60, having just recently taken her hydralazine 50 mg tab. She tells me for the past 3 days she has been experiencing elevated blood pressures with readings ranging from 160-737 systolic. Previous readings ranged from 130-160 in the morning to 1-teens in the evenings. She is and has been completely asymptomatic. She has required the usage of her prn hydralazine 50 mg over the past 3 days, with good results in lowering her blood pressure. She denies chest pain, including today. No nausea, vomiting, diaphoresis, palpitations, presyncope, or syncope. She did have a mild headache that is improving.      Past Medical History  Diagnosis Date  . Hyperlipidemia   . Orthostatic hypotension   . Carotid artery disease   .  PONV (postoperative nausea and vomiting)     "when I was younger"  . Hypertension   . OSA on CPAP     "wear it intermittently"  . Sinus headache   . Headache     "lately; related to HTN" (04/24/2014)  . Arthritis     "qwhere"  . Chronic lower  back pain   . Lung cancer 2003    s/p left lower lobe resection  . Non-obstructive CAD     a. 04/2014 Cath: LM nl, LAD/D1/LCX/RCA min irregs, RPDA/PAV nl.  . Renal artery stenosis     a. 04/2014 Renal angiography: LRA 95p, RRA 40ost. PTA of LRA deferred 2/2 guide cath induced Ao dissection;  01/27 6 x 15 mm Herculink stent to the L-RA   :  Past Surgical History  Procedure Laterality Date  . Appendectomy    . Total hip arthroplasty Right   . Replacement total knee Left   . Bilateral oophorectomy Bilateral 2000's  . Vaginal delivery  X 2  . Cataract extraction w/ intraocular lens  implant, bilateral Bilateral   . Cardiac catheterization  04/24/2014  . Renal angiogram  04/24/2014  . Joint replacement    . Vaginal hysterectomy  1980's  . Lung lobectomy Left 2003    "lower lobe"  . Tubal ligation  ~ 1975  . Left heart catheterization with coronary angiogram N/A 04/24/2014    Procedure: LEFT HEART CATHETERIZATION WITH CORONARY ANGIOGRAM;  Surgeon: Wellington Hampshire, MD;  Location: [facility] CATH [facility];  Service: Cardiovascular;  Laterality: N/A;  . Renal angiogram N/A 04/24/2014    Procedure: RENAL ANGIOGRAM;  Surgeon: Wellington Hampshire, MD;  Location: [facility] C[facility];  Service: Cardiovascular;  Laterality: N/A;  . Renal angiogram Left 05/01/2014    Procedure: RENAL ANGIOGRAM;  Surgeon: Wellington Hampshire, MD;  Location: [facility];  Service: Cardiovascular;  Laterality: Left;  . Peripheral vascular catheterization  05/01/2014    Procedure: RENAL INTERVENTION;  Surgeon: Wellington Hampshire, MD; 6 x 15 mm Herculink stent to the Left Renal Artery  :  Social History:  The patient  reports that she has never smoked. She has never used smokeless tobacco. She reports that she drinks alcohol. She reports that she does not use illicit drugs.   Family History  Problem Relation Age of Onset  . Parkinsonism Mother   . Sarcoidosis Brother   . Stroke Maternal Grandmother   . Cancer Paternal Grandmother       Allergies:  Allergies  Allergen Reactions  . Atorvastatin Other (See Comments)    Muscle weakness  . Penicillins Hives  . Sulfa Antibiotics Hives     Home Medications:  Current Outpatient Prescriptions  Medication Sig Dispense Refill  . Ascorbic Acid (VITAMIN C CR) 1500 MG TBCR Take by mouth daily.    Marland Kitchen aspirin 81 MG tablet Take 81 mg by mouth daily.    . B Complex Vitamins (VITAMIN B COMPLEX PO) Take by mouth.    . carvedilol (COREG) 6.25 MG tablet Take 1 tablet (6.25 mg total) by mouth 2 (two) times daily with a meal. 180 tablet 3  . Cholecalciferol 2000 UNITS TABS Take 1 tablet by mouth daily.    . Fish Oil OIL by Does not apply route.    . fluticasone (FLONASE) 50 MCG/ACT nasal spray Place 1 spray into both nostrils daily as needed.     Marland Kitchen losartan-hydrochlorothiazide (HYZAAR) 100-25 MG per tablet Take 1 tablet by mouth daily. 90 tablet 3  .  Lysine HCl 500 MG CAPS Take 1,000 mg by mouth daily as needed (for cold sores).     . pantoprazole (PROTONIX) 40 MG tablet Take 1 tablet (40 mg total) by mouth daily. 90 tablet 3  . rosuvastatin (CRESTOR) 5 MG tablet Take 1 tablet (5 mg total) by mouth daily. 90 tablet 1  . traZODone (DESYREL) 50 MG tablet Take 0.5-1 tablets (25-50 mg total) by mouth at bedtime as needed for sleep. 90 tablet 0  . Zinc 50 MG TABS Take by mouth daily.     No current facility-administered medications for this visit.     Review of Systems:  Review of Systems  Constitutional: Positive for malaise/fatigue. Negative for fever, chills, weight loss and diaphoresis.  HENT: Negative for hearing loss, nosebleeds and tinnitus.   Eyes: Negative for blurred vision, double vision, photophobia, pain, discharge and redness.  Respiratory: Negative for cough, hemoptysis, sputum production, shortness of breath and wheezing.   Cardiovascular: Negative for chest pain, palpitations, orthopnea, claudication, leg swelling and PND.  Gastrointestinal: Negative for  heartburn, nausea and vomiting.  Skin: Negative for itching and rash.  Neurological: Positive for tingling, weakness and headaches. Negative for tremors, speech change, focal weakness and loss of consciousness.  Psychiatric/Behavioral: The patient is not nervous/anxious.      Physical Exam:  Height 5\' 1"  (1.549 m), weight 125 lb (56.7 kg). Body mass index is 23.63 kg/(m^2). General: Pleasant, NAD Psych: Normal affect. Neuro: Alert and oriented X 3. Moves all extremities spontaneously. HEENT: Normal  Neck: Supple without bruits or JVD. Lungs:  Resp regular and unlabored, CTA. Heart: RRR no s3, s4, or murmurs. Abdomen: Soft, non-tender, non-distended, BS + x 4.  Extremities: No clubbing, cyanosis or edema. DP/PT/Radials 2+ and equal bilaterally.   Accessory Clinical Findings:  EKG - NSR, 84 bpm, left axis deviation, nonspecific lateral st/t changes   Other studies Reviewed: Additional studies/ records that were reviewed today include: prior renal arteriogram 04/2014, cardiac cath, 04/2014, previous cardiology notes, and PCP notes.   Recent Labs: 02/12/2014: TSH 0.97 05/13/2014: ALT 33; BUN 21; Creatinine 0.94; Hemoglobin 11.9*; Platelets 312.0; Potassium 4.7; Sodium 138    Lipid Panel    Component Value Date/Time   CHOL 198 02/12/2014 1313   TRIG 86.0 02/12/2014 1313   HDL 65.30 02/12/2014 1313   CHOLHDL 3 02/12/2014 1313   VLDL 17.2 02/12/2014 1313   LDLCALC 116* 02/12/2014 1313   LDLDIRECT 201.7 02/05/2013 1050     Weights: Wt Readings from Last 3 Encounters:  07/15/14 125 lb (56.7 kg)  07/15/14 126 lb 6 oz (57.323 kg)  06/10/14 127 lb 8 oz (57.834 kg)     Assessment & Plan:  1. Labile renovascular HTN s/p left renal artery stenting with residual 40% of right renal artery on 05/01/2014:  -Improved prior to office visit s/p prn hydralazine 50 mg  -Schedule renal duplex (cannot be performed at [facility])  -Change prn 50 mg hydralazine to hydralazine 10 tid scheduled  with hold parameters of <448 systolic, she reports not feeling well if her BP gets much below 120 range -Continue Coreg 6.25 mg bid, Hyzaar 100/25 mg daily -Check bmet -Continue aspirin 81 mg daily   2. Nonobstructive CAD by cardiac cath 04/2014: -No anginal symptoms  -Continue aspirin as above   3. HLD: -Continue Crestor 5 mg daily  4. Carotid artery stenosis: -On Crestor as above   Dispo:  -Call with blood pressure readings in 1 week -Follow up with PCP as directed -Cardiology  follow up pending the above    Christell Faith, PA-C [facility] [place] [address] [place], [place] [address] [phone] [facility] 07/15/2014, 1:45 PM

## 2014-07-15 NOTE — Patient Instructions (Addendum)
Please start Hydralazine 10 mg take one tablet three times a day.  Hold for systolic (top #)< 161.   We will draw labs today: BMET   Continue to monitor Blood Pressure and Heart Rate and give [place] a call in a week with the readings.  Your physician has requested that you have a renal artery duplex. During this test, an ultrasound is used to evaluate blood flow to the kidneys. Allow one hour for this exam. Do not eat after midnight the day before and avoid carbonated beverages. Take your medications as you usually do.  Call or return to clinic prn if these symptoms worsen or fail to improve as anticipated.

## 2014-07-15 NOTE — Telephone Encounter (Signed)
Pt care taker is calling stating that pt BP was high over the weekend. Tried staying out of the hospital. Got stent put in a few months ago Only change was her Plavix she was taken off this.  193/103 yesterday, took her hydralazine  She would take that before her surgery It has been high. But it came for a bit but then would shoot back high. She will call back with more BP med's from over the weekend.

## 2014-07-15 NOTE — Patient Instructions (Signed)
To ER for evaluation.

## 2014-07-15 NOTE — Progress Notes (Signed)
Subjective:    Patient ID: Tamara Burton, female    DOB: Jan 16, 1934, 81 y.o.   MRN: [id number]  HPI 81YO female presents for followup.  Stopped Plavix about 2 weeks ago. BP this weekend suddenly >200/100s. Previously well controlled in 130s/80s. A few brief episodes of left sided chest pain this morning, lasted only a few seconds. Daughter, who is with her, is a Marine scientist and has been monitoring BP. They prefer to avoid ED if possible. She reports they have used Hydralazine in the past to help with elevated BP.   Past medical, surgical, family and social history per today's encounter.  Review of Systems  Constitutional: Negative for fever, chills, appetite change, fatigue and unexpected weight change.  Eyes: Negative for visual disturbance.  Respiratory: Negative for shortness of breath.   Cardiovascular: Positive for chest pain. Negative for leg swelling.  Gastrointestinal: Negative for abdominal pain.  Skin: Negative for color change and rash.  Neurological: Negative for headaches.  Hematological: Negative for adenopathy. Does not bruise/bleed easily.  Psychiatric/Behavioral: Negative for dysphoric mood. The patient is not nervous/anxious.        Objective:    BP 208/61 mmHg  Pulse 82  Temp(Src) 98.1 F (36.7 C) (Oral)  Ht 5\' 1"  (1.549 m)  Wt 126 lb 6 oz (57.323 kg)  BMI 23.89 kg/m2  SpO2 100% Physical Exam  Constitutional: She is oriented to person, place, and time. She appears well-developed and well-nourished. No distress.  HENT:  Head: Normocephalic and atraumatic.  Right Ear: External ear normal.  Left Ear: External ear normal.  Nose: Nose normal.  Mouth/Throat: Oropharynx is clear and moist. No oropharyngeal exudate.  Eyes: Conjunctivae are normal. Pupils are equal, round, and reactive to light. Right eye exhibits no discharge. Left eye exhibits no discharge. No scleral icterus.  Neck: Normal range of motion. Neck supple. No tracheal deviation present. No  thyromegaly present.  Cardiovascular: Normal rate, regular rhythm, normal heart sounds and intact distal pulses.  Exam reveals no gallop and no friction rub.   No murmur heard. Pulmonary/Chest: Effort normal and breath sounds normal. No respiratory distress. She has no wheezes. She has no rales. She exhibits no tenderness.  Musculoskeletal: Normal range of motion. She exhibits no edema or tenderness.  Lymphadenopathy:    She has no cervical adenopathy.  Neurological: She is alert and oriented to person, place, and time. No cranial nerve deficit. She exhibits normal muscle tone. Coordination normal.  Skin: Skin is warm and dry. No rash noted. She is not diaphoretic. No erythema. No pallor.  Psychiatric: Her behavior is normal. Judgment and thought content normal. Her mood appears anxious.          Assessment & Plan:   Problem List Items Addressed This Visit      Unprioritized   Hypertensive urgency - Primary    BP Readings from Last 3 Encounters:  07/15/14 208/61  06/10/14 160/80  05/13/14 168/70   BP markedly elevated today, much higher than previous. I am concerned that her left renal artery stent may have stenosed given she recently stopped Plavix. Her cardiologist is out of town and no available visits with local cardiologist. Given she is having left chest pain and hypertension, recommended urgent evaluation in ED. Discussed risk of stroke, MI with pt and her daughter.      RAS (renal artery stenosis)    S/p left RAS. Now with sudden increase in BP after stopping Plavix, concerning for stent stenosis.  Return in about 1 week (around 07/22/2014) for Recheck.

## 2014-07-15 NOTE — Telephone Encounter (Signed)
See below

## 2014-07-15 NOTE — Assessment & Plan Note (Signed)
S/p left RAS. Now with sudden increase in BP after stopping Plavix, concerning for stent stenosis.

## 2014-07-15 NOTE — Assessment & Plan Note (Signed)
BP Readings from Last 3 Encounters:  07/15/14 208/61  06/10/14 160/80  05/13/14 168/70   BP markedly elevated today, much higher than previous. I am concerned that her left renal artery stent may have stenosed given she recently stopped Plavix. Her cardiologist is out of town and no available visits with local cardiologist. Given she is having left chest pain and hypertension, recommended urgent evaluation in ED. Discussed risk of stroke, MI with pt and her daughter.

## 2014-07-15 NOTE — Telephone Encounter (Signed)
PT care taker calling back stating that she wants to see if we can send her to our other office for she waited about 4 hours and ended up going to her PCP  But was told to go to ER for we did not have any openings and her doctor was not in She was upset when I then told her she stated she would call us back with more readings, but then stated she did not say this.  I tried to call our [place] office but they did not have any openings either.  Please advise.  Pt caretaker stated she did not like going to er for they just lower bp for a bit and then send her home.

## 2014-07-15 NOTE — Telephone Encounter (Signed)
Spoke w/ [name].  She reports that pt went to PCP office this am and was advised to proceed to ED. She states that this is an ongoing issue that Dr. Fletcher Anon is aware of and that she does not feel the need to go to ED. She states that PCP is concerned that renal artery is stenosed since stopping Plavix.   Spoke w/ Christell Faith, PA.  He reviewed pt's chart and asks that pt come in for eval. Pt sched for today at 1:45. [name] is agreeable to this and appreciative that we can work pt in.

## 2014-07-16 LAB — BASIC METABOLIC PANEL
BUN/Creatinine Ratio: 18 (ref 11–26)
BUN: 14 mg/dL (ref 8–27)
CHLORIDE: 101 mmol/L (ref 97–108)
CO2: 25 mmol/L (ref 18–29)
Calcium: 10 mg/dL (ref 8.7–10.3)
Creatinine, Ser: 0.79 mg/dL (ref 0.57–1.00)
GFR calc Af Amer: 83 mL/min/{1.73_m2} (ref >59)
GFR calc non Af Amer: 72 mL/min/{1.73_m2} (ref >59)
Glucose: 116 mg/dL — ABNORMAL HIGH (ref 65–99)
Potassium: 5.1 mmol/L (ref 3.5–5.2)
Sodium: 143 mmol/L (ref 134–144)

## 2014-07-22 ENCOUNTER — Telehealth: Payer: Self-pay | Admitting: *Deleted

## 2014-07-22 ENCOUNTER — Ambulatory Visit (HOSPITAL_COMMUNITY): Payer: Medicare PPO | Attending: Cardiovascular Disease | Admitting: Cardiology

## 2014-07-22 ENCOUNTER — Telehealth: Payer: Self-pay

## 2014-07-22 DIAGNOSIS — I10 Essential (primary) hypertension: Secondary | ICD-10-CM | POA: Diagnosis not present

## 2014-07-22 DIAGNOSIS — I701 Atherosclerosis of renal artery: Secondary | ICD-10-CM | POA: Insufficient documentation

## 2014-07-22 NOTE — Telephone Encounter (Signed)
See [employer] phone note. Patient has not been taking hydralazine scheduled as directed. BP will continue to be labile unless she takes her medications. Would advise patient to take medications.

## 2014-07-22 NOTE — Telephone Encounter (Signed)
Spoke w/ pt.  She reports that she was advised to check her BP TID before taking her hydralazine and hold if SBP <150. She states that she must check her BP in order to know if she should take her meds.  She states that hydralazine 10 mg "is not touching" her numbers. Reports that she can feel when her BP is elevated, that her "heart races and I feel bitter cold". Reports that her caregiver [name] has been adjusting her hydralazine and advising her when to take 25 mg or 50 mg.  Advised pt not to self-medicate, but she reports that [name] is a Insurance claims handler, her neighbor, her best friend, and has her masters in strokes and hearts." Pt states that she has been lying down w/ her feet elevated and trying to increase her fluids, as her BP was 184/84 most recently. Advised her that doing the above is treatment for low BP.  She states that she was "advised otherwise". Advised pt that I will make Ryan aware, as she does not feel that her current med regimen is working for her.

## 2014-07-22 NOTE — Telephone Encounter (Signed)
I was asked to speak with patient by Elmo Putt at registration.  Elmo Putt stated pt here for renal artery doppler and she was concerned that pt's speech may be a slurred.  Pt's BP in office today was 150/66 and pulse 84. Pt states this AM she checked her BP before taking hydralazine, it was 182/92, pt states she felt "out of body" like she usually  feels when her BP goes up, but denies any other symptoms, including problems with her speech.  Pt denies any symptoms at this time.   Pt states she called her caregiver,Annie, this morning after checking her BP, she  advised her to take hydralazine 50mg . Pt states she has been taking her BP 3-4 times daily and taking  either 25mg  or 50mg  tid hydralazine based on BP readings.  She has not been taking hydralazine 10mg  tid as instructed at time off office visit 07/15/14 with Laurine Blazer. Pt did report taking coreg 6.25mg  bid and Hyzaar daily regularly for her BP.  Pt advised to report BP readings from the last week to Christell Faith, PA at the [employer] today. Pt agreed to do this and stated she would probably make a copy of readings and take them by the [employer].  I have reviewed this with Kathrene Alu and she agreed with this plan.

## 2014-07-22 NOTE — Telephone Encounter (Signed)
If initial readings of 123/63 include her taking hydralazine (either 25 or 50) then she can start to take hydralazine 25 mg tid scheduled.   Would not increase fluids for HTN, that will increase BP (she is increasing intravascular volume).

## 2014-07-22 NOTE — Progress Notes (Signed)
Renal artery duplex performed. Patient indicated BP was quite elevated this AM. At time of study, it was 150/66 mmHg.  Patient states she is taking 25 - 50 mg hydralazine up to three times a day, according to how high her BP runs, and as directed by her caregiver.

## 2014-07-22 NOTE — Telephone Encounter (Signed)
Pt called with BP readings: 07-19-2014-179/88, HR 70- 6:30 a.m.; 198/91, HR 73-7:50 a.m., 147/76, HR 72-10:00 a.m.; 160/77, HR 72-2:00 p.m; 122/54, HR 63-8:17 p.m.; 140/58, HR 68-10:00 p.m. 07-20-2014-135/67, HR 67-7:40 a.m.;140/78, HR 74-10:40 a.m.; 165/79, HR 78-1:20 p.m.; 123/63, HR 67-8:15 p.m. 07-21-2014-156/78, HR 65-8:05 a.m.; 163/74, HR 74-3:30 p.m.; 148/67, HR 70-9:55 p.m. 07-22-2014-182/93, HR 72-6:55 a.m.;

## 2014-07-22 NOTE — Telephone Encounter (Signed)
Pt called back, states her BP at 11:00 184/84, HR was 95, states she took a hydralazine.

## 2014-07-23 MED ORDER — HYDRALAZINE HCL 25 MG PO TABS: 25.0000 mg | ORAL_TABLET | Freq: Three times a day (TID) | ORAL | Status: AC

## 2014-07-23 NOTE — Telephone Encounter (Signed)
Spoke w/ pt.  Advised her of Ryan's recommendation.  She verbalizes understanding and will call back w/ any questions or concerns.

## 2014-07-27 NOTE — Consult Note (Signed)
General Aspect Primary Cardiologist: Dr. Fletcher Anon, MD _______________  81 year old female with history of recently diagnosed HTN, lung cancer s/p left lower lobectomy in 2002, carotid artery stenosis, OSA not on CPAP who presented to [facility] on 04/02/2014 with accelerated HTN, SBP in the ED greater than 200, headache, lightheadedness, and slurred speech. Cardiology was consulted for management of HTN.  _______________  PMH: 1. Recently diagnosed HTN (02/21/2014) 2. Lung cancer s/p lobectomy 2002 3. Carotid artery stenosis 4. OSA not on CPAP ______________   Present Illness 81 year old female with the above problem list who presented to [facility] on 04/02/2014 with accelerated HTN, SBP in the ED greater than 200, headache, lightheadedness, and slurred speech.   Patient without any prior known cardiac history. She was recently referred to our office by her PCP on 04/01/2014 for further evaluation of her HTN. She reports a history throughout her life of longstanding low blood pressure. She was recently seen in mid November of this year for a sinus infection by her PCP and and was found to have elevated BP (unsure how elevated). BP was rechecked and it was slightly better. She came back 1 week later for recheck of her BP and it was high again. She was started on low dose amlodipine and HCTZ. She did not tolerate those medications 2/2 palpitations and anxiety sensation. Both her daughter and a friend that is a Marine scientist stated she sounded drunk. She presented back to her PCP and was started on carvedilol after stopping the amlodipine and HCTZ. She was tolerating the b-blocker well and titrated up, however her BP remained uncontrolled. She was sent to Dr. Fletcher Anon for further evaluation. She was complaining of worsening DOE, no chest discomfort. Labs in 02/2014 were unremarkable. She denies any tobacco, or illicit drugs. Rarely drinks alcohol. At her initial OV with Dr. Fletcher Anon she was advised secondary causes of HTN should  be excluded. Losartan was also added. Her BP at that OV was 199/80.   She come into [facility] stating she was checking her BP at home and getting readings in the 220s/1-teens. She notified her friend who advised to her come to the ED. Upon her arrival she was found to have SBP greater than 200. She was started on nitro paste with good results of BP. She indicates that she has not taken her antihypertensives as directed. CT of her head was negative for acute pathology. She states she always gets a headache when the weather changes as it has been lately 2/2 her chronic sinusitis which she has been taking OTC decongestants. Troponin negative x 1. TSH normal. Echo and carotid dopplers are pending. BP has improved to the 140s-160s/90s. She is currently resting comfortably in the bed without complaints.   Physical Exam:  GEN well developed, well nourished, no acute distress   HEENT PERRL, hearing intact to voice   NECK supple   RESP normal resp effort  wheezing   CARD Regular rate and rhythm  Normal, S1, S2  No murmur   ABD denies tenderness  soft   EXTR negative edema   SKIN normal to palpation   NEURO cranial nerves intact   PSYCH alert, A+O to time, place, person, good insight   Review of Systems:  General: No Complaints   Skin: No Complaints   ENT: No Complaints   Eyes: No Complaints   Neck: No Complaints   Respiratory: frequent sinus headaches   Cardiovascular: Palpitations   Gastrointestinal: No Complaints   Genitourinary: No  Complaints   Vascular: No Complaints   Musculoskeletal: No Complaints   Neurologic: Dizzness  Headache   Hematologic: No Complaints   Endocrine: No Complaints   Psychiatric: No Complaints   Review of Systems: All other systems were reviewed and found to be negative   Medications/Allergies Reviewed Medications/Allergies reviewed   Family & Social History:  Family and Social History:  Family History Stroke  mother: parkinson, alzheimer;  sister: sarcoid   Social History negative tobacco, positive ETOH, negative Illicit drugs   Place of Living Home     Cancer: lung   Lobectomy: left lower lobe   Knee Surgery - Left: joint repl   Hip Replacement - Right:   Home Medications: Medication Instructions Status  Crestor 5 mg oral tablet 1 tab(s) orally once a day (at bedtime) Active  losartan 50 mg oral tablet 1 tab(s) orally once a day Active  carvedilol 6.25 mg oral tablet 1 tab(s) orally 2 times a day Active  multivitamin with minerals 1 tab(s) orally once a day Active  aspirin 81 mg oral tablet 1 tab(s) orally once a day Active   Lab Results:  Thyroid:  30-Dec-15 04:00   Thyroid Stimulating Hormone 1.11 (0.45-4.50 (IU = International Unit)  ----------------------- Pregnant patients have  different reference  ranges for TSH:  - - - - - - - - - -  Pregnant, first trimetser:  0.36 - 2.50 uIU/mL)  Routine Chem:  29-Dec-15 12:59   Glucose, Serum  108  BUN 10  Creatinine (comp) 0.70  Sodium, Serum 143  Potassium, Serum  3.3  Chloride, Serum 104  CO2, Serum 32  Calcium (Total), Serum 8.9  Anion Gap 7  Osmolality (calc) 285  eGFR (African American) >60  eGFR (Non-African American) >60 (eGFR values <46mL/min/1.73 m2 may be an indication of chronic kidney disease (CKD). Calculated eGFR, using the MRDR Study equation, is useful in  patients with stable renal function. The eGFR calculation will not be reliable in acutely ill patients when serum creatinine is changing rapidly. It is not useful in patients on dialysis. The eGFR calculation may not be applicable to patients at the low and high extremes of body sizes, pregnant women, and vegetarians.)  Cardiac:  29-Dec-15 12:59   Troponin I < 0.02 (0.00-0.05 0.05 ng/mL or less: NEGATIVE  Repeat testing in 3-6 hrs  if clinically indicated. >0.05 ng/mL: POTENTIAL  MYOCARDIAL INJURY. Repeat  testing in 3-6 hrs if  clinically indicated. NOTE: An increase or  decrease  of 30% or more on serial  testing suggests a  clinically important change)  Routine Hem:  29-Dec-15 12:59   WBC (CBC) 6.6  RBC (CBC) 5.12  Hemoglobin (CBC) 14.9  Hematocrit (CBC) 45.3  Platelet Count (CBC) 219 (Result(s) reported on 02 Apr 2014 at 01:30PM.)  MCV 88  MCH 29.2  MCHC 33.0  RDW 13.5   EKG:  EKG Interp. by me   Interpretation NSR, 71 bpm, LVH, biatrial enlargement, nonspecific st/t changes c/w hypertensive heart disease   Radiology Results: XRay:    29-Dec-15 15:27, Chest PA and Lateral  Chest PA and Lateral   REASON FOR EXAM:    sob  COMMENTS:       PROCEDURE: DXR - DXR CHEST PA (OR AP) AND LATERAL  - Apr 02 2014  3:27PM     CLINICAL DATA:  Sudden onset of weakness, shortness of breath and  tachycardia.    EXAM:  CHEST  2 VIEW    COMPARISON:  None.  FINDINGS:  Mild cardiac enlargement. Postoperative changes and volume loss  within the left lung noted. Associated blunting of the left  costophrenic angle noted. Coarsened interstitial markings are noted  bilaterally. No pleural effusion, edema or airspace consolidation.     IMPRESSION:  1. No acute cardiopulmonary abnormalities.      Electronically Signed    By: Kerby Moors M.D.    On: 04/02/2014 15:31         Verified By: Angelita Ingles, M.D.,  CT:    29-Dec-15 16:26, CT Head Without Contrast  CT Head Without Contrast   REASON FOR EXAM:    headach and weakness  COMMENTS:       PROCEDURE: CT  - CT HEAD WITHOUT CONTRAST  - Apr 02 2014  4:26PM     CLINICAL DATA:  Sudden onset of weakness, left shoulder pain.    EXAM:  CT HEAD WITHOUT CONTRAST    TECHNIQUE:  Contiguous axial images were obtained from the base of the skull  through the vertex without intravenous contrast.    COMPARISON:  None.  FINDINGS:  Patchy low-density scattered throughout the white matter compatible  with chronic microvascular disease. No acuteintracranial  abnormality. Specifically, no  hemorrhage, hydrocephalus, mass  lesion, acute infarction, or significant intracranial injury. No  acute calvarial abnormality. Visualized paranasal sinuses and  mastoids clear. Orbital soft tissues unremarkable.     IMPRESSION:  Chronic microvascular disease.  No acute intracranial abnormality.      Electronically Signed    By: Rolm Baptise M.D.    On: 04/02/2014 16:30     Verified By: Raelyn Number, M.D.,    Sulfa drugs: Rash  Penicillin: Rash  Vital Signs/Nurse's Notes: **Vital Signs.:   30-Dec-15 05:00  Vital Signs Type Routine  Temperature Temperature (F) 97.9  Celsius 36.6  Pulse Pulse 56  Respirations Respirations 18  Systolic BP Systolic BP 389  Diastolic BP (mmHg) Diastolic BP (mmHg) 69  Mean BP 99  Pulse Ox % Pulse Ox % 98  Pulse Ox Activity Level  At rest  Oxygen Delivery Room Air/ 21 %    Impression 81 year old female with history of recently diagnosed HTN, lung cancer s/p left lower lobectomy in 2002, carotid artery stenosis, OSA not on CPAP who presented to [facility] on 04/02/2014 with accelerated HTN, SBP in the ED greater than 200, headache, lightheadedness, and slurred speech. Cardiology was consulted for management of HTN.   1. Accelerated HTN: -Improving -Rule out secondary etiology of her HTN given reported history of low BP throughout her life -Needs renal artery dopplers as outpatient -Consider sed rate, ANA if dopplers are normal -HR limits further titration of Coreg at this time, continue current dose of 6.25 mg -She did not tolerate amlodipine or HCTZ -Can titrate losartan at this time given no known renal artery complications, though follow up must be obtained -Should renal complications be found would require change to different antihypertensive -Stop OTC decongestants for chronic sinusitis -No chest pain or symptoms suspicous for aortic dissection   2. Headache/dizziness/slurred speech: -Head CT negative -Likely complex headache in the setting  of her uncontrolled HTN -Could consider MRI brain  3. History of palpitations: -None currently -Should they return can add Holter monitor  4. History of SOB: -Echo is pending  5. Hypokalemia: -Replete to 4.0   Electronic Signatures for Addendum Section:  Kathlyn Sacramento (MD) (Signed Addendum 30-Dec-15 13:11)  The patient was seen and examined. Agree with the above. She  was seen yesterday for HTN. Losartan was added and renal artery duplex was scheduled. She presented with headache and severely elevated BP. She feels better now. Echo showed normal EF.  Agree with increasing Losartan and continue Coreg. Ok to discharge home today.   Electronic Signatures: Kathlyn Sacramento (MD)  (Signed 30-Dec-15 13:11)  Co-Signer: General Aspect/Present Illness, History and Physical Exam, Review of System, Family & Social History, Past Medical History, Home Medications, Labs, EKG , Radiology, Allergies, Vital Signs/Nurse's Notes, Impression/Plan Dunn, Ryan M (PA-C)  (Signed 30-Dec-15 10:27)  Authored: General Aspect/Present Illness, History and Physical Exam, Review of System, Family & Social History, Past Medical History, Home Medications, Labs, EKG , Radiology, Allergies, Vital Signs/Nurse's Notes, Impression/Plan   Last Updated: 30-Dec-15 13:11 by Kathlyn Sacramento (MD)

## 2014-07-31 NOTE — H&P (Signed)
PATIENT NAME:  Tamara Burton, Tamara Burton MR#:  [id number] DATE OF BIRTH:  1934-03-12  DATE OF ADMISSION:  04/02/2014  REFERRING PHYSICIAN: Sheryl L. Benjaman Lobe, MD   PRIMARY CARE PHYSICIAN: Dr. Ronette Deter.   ADMISSION DIAGNOSES: Uncontrolled hypertension, possible cerebrovascular accident.   HISTORY OF PRESENT ILLNESS: This is a 81 year old Caucasian female who presents to the Emergency Department after complaining of lightheadedness and feeling as if "a surge was going up her body "  The patient is aware of having high blood pressure and saw her cardiologist, Dr. Fletcher Anon, yesterday in consultation for medical management of the same. He added losartan to her carvedilol, which she reports taking last night, but immediately felt little lightheaded. This morning, she complained of headache, as well as a racing heartbeat and cramping in her left shoulder, which prompted her to come to the Emergency Department. She denies any nausea or vomiting, but says that all of these symptoms make her feel weak. She admits, also, that she has lightheadedness associated with slurred speech, even when she does not have the other symptoms. She has been through multiple combinations of antihypertensive agents without successful control of her blood pressure. Due to prolonged attempts to bring her blood pressure down without success , the Emergency Department has called for admission.   REVIEW OF SYSTEMS:  CONSTITUTIONAL: The patient denies fever, but admits to generalized weakness.  EYES: Denies blurred vision or inflammation.  EARS, NOSE AND THROAT: Denies tinnitus or sore throat.  RESPIRATORY: Denies cough or shortness of breath.  CARDIOVASCULAR: Denies chest pain, orthopnea, dyspnea on exertion, but admits to palpitations.  GASTROINTESTINAL: Denies nausea, vomiting, diarrhea, or abdominal pain.  GENITOURINARY: Denies dysuria, increased frequency, hesitancy of urination.  ENDOCRINE: Denies polyuria, polydipsia.  HEMATOLOGIC  AND LYMPHATIC: Denies easy bruising or bleeding.  INTEGUMENTARY: Denies rashes or lesions.  MUSCULOSKELETAL: Denies arthralgias or myalgias.  NEUROLOGIC: Denies numbness in her extremities, but admits to slurred speech, both today and on other occasions. The patient admits to headache.  PSYCHIATRIC: Denies depression or suicidal ideation.   PAST MEDICAL HISTORY:  Hypertension, carotid artery disease, chronic sinusitis, obstructive sleep apnea, and history of lung cancer, status post resection with no chemo or radiation.   PAST SURGICAL HISTORY: Left lower lobectomy.   SOCIAL HISTORY: The patient lives alone. She has some former neighbors who are very close, family friends that check in on her frequently. She admits to having an alcoholic drink every once in a while. She denies smoking or any recreational drug use.   FAMILY HISTORY: The patient's mother is deceased of Parkinson disease.   MEDICATIONS: 1. Aspirin 81 mg 1 tab p.o. daily.  2. Carvedilol 6.25 mg 1 tablet p.o. b.i.d.  3. Crestor 5 mg 1 tablet p.o. at bedtime.  4. Losartan 50 mg 1 tab p.o. daily.  5. Multivitamin 1 tab p.o. daily.   ALLERGIES: PENICILLIN AND SULFA DRUGS.   PERTINENT LABORATORY RESULTS AND RADIOGRAPHIC FINDINGS:  Serum glucose is 108, BUN is 10, creatinine 0.7 serum sodium 143, serum potassium is 3.3, chloride is 104, bicarbonate 32, calcium 8.9. Troponin is negative. White blood cell count 6.6, hemoglobin 14.9, hematocrit 45.3, platelet count 219,000. Chest x-ray shows no acute cardiopulmonary abnormalities. CT scan of the brain without contrast shows chronic microvascular disease without any acute intracranial abnormality.   PHYSICAL EXAMINATION: VITAL SIGNS: Temperature is 97.9, pulse 61, respirations 12, blood pressure 187/87, pulse ox is 95% on room air.  GENERAL: The patient is alert and oriented x3 in no apparent distress.  HEENT: Normocephalic, atraumatic. Pupils equal, round, and reactive to light and  accommodation. Extraocular movements are intact. Mucous membranes are moist.  NECK: Trachea is midline. No adenopathy.  CHEST: Symmetric and atraumatic. There is a well-healed left-sided thoracotomy scar.  CARDIOVASCULAR: Regular rate and rhythm. Normal S1, S2. No rubs, clicks, or murmurs appreciated. I do not detect any carotid bruits bilaterally. The patient has equal pulses in her upper extremities, as well as 2+ dorsalis pedis pulses bilaterally.  LUNGS: Clear to auscultation bilaterally. Normal effort and excursion.  ABDOMEN: Positive bowel sounds. Soft, nontender, nondistended. No hepatosplenomegaly. There is no pulsatile mass. The dimensions of the abdominal aorta are vague, but do not appear to be enlarged.  GENITOURINARY: Deferred.  MUSCULOSKELETAL: The patient moves all 4 extremities equally. There is 5/5 strength in upper and lower extremities bilaterally.  SKIN: No rashes or lesions.  EXTREMITIES: No clubbing, cyanosis, or edema.  NEUROLOGIC: Cranial nerves II through XII are grossly intact; however, the patient's speech is clearly slurred.  PSYCHIATRIC: Mood is normal. Affect is congruent.   ASSESSMENT AND PLAN: This is a 81 year old female admitted for malignant hypertension.  1.  Hypertension, uncontrolled. It is associated with slurred speech. The remainder of the patient's neurologic exam is nonfocal; however, it is concerning that her heart rate lowers as her blood pressure increases, along with her observed respiratory rate. These symptoms could indicate subacute stroke. When the patient arrived to the Emergency Department, her systolic blood pressures more than 200 and now it is in the 180s with Nitrol ointment placed on the patient's chest. I will not start try to lower the pressure anymore, for now, as we will allow permissive hypertension until we rule out cerebrovascular accident. Dr. Fletcher Anon has scheduled outpatient labs to evaluate for secondary causes of hypertension. However,  the patient intimates that she may not have taken all of these medications regularly or given them enough of a trial. We will observe the patient's blood pressure in the hospital were we can monitor compliance. We have replaced her on telemetry.  2.  Cerebral vascular accident evaluation. I have ordered a head CT scan without contrast. Thankfully, it is negative at this time. We will obtain carotid Doppler results from her vascular surgeon's office. If they are particularly old, we will obtain an MRI and an MRA. I have also ordered an echocardiogram and a cardiology consult. Neurological checks are ordered every 4 hours at this time.  3.  Chronic sinusitis. It may be contributing to the patient's lightheadedness and slurred speech, although, admittedly, a nasal voice is more frequently associated with chronic sinus congestion. Another possible etiology of her uncontrolled hypertension is that she may be using over-the-counter decongestants that increased her blood pressure.  4.  Obstructive sleep apnea. We will place the patient on nocturnal CPAP based on her home settings.  5.  Deep vein thrombosis prophylaxis. Heparin.  6.  Gastrointestinal prophylaxis. Unnecessary, as the patient is not critically ill.   CODE STATUS: The patient is a full code.   TIME SPENT ON ADMISSION ORDERS AND PATIENT CARE: Approximately 40 minutes.    ____________________________ Norva Riffle. Marcille Blanco, MD msd:mw D: 04/02/2014 16:58:54 ET T: 04/02/2014 17:23:36 ET JOB#: [id number]  cc: Norva Riffle. Marcille Blanco, MD, <Dictator> Norva Riffle Kiyana Vazguez MD ELECTRONICALLY SIGNED 04/10/2014 0:44

## 2014-08-01 ENCOUNTER — Encounter: Payer: Self-pay | Admitting: Internal Medicine

## 2014-08-01 DIAGNOSIS — G894 Chronic pain syndrome: Secondary | ICD-10-CM

## 2014-08-04 NOTE — Discharge Summary (Signed)
PATIENT NAME:  Tamara Burton, Tamara Burton MR#:  [id number] DATE OF BIRTH:  1933-04-25  DATE OF ADMISSION:  04/02/2014 DATE OF DISCHARGE:  04/03/2014  ADMITTING DIAGNOSIS:  Hypertension, uncontrolled.   DISCHARGE DIAGNOSES:  1. Malignant essential hypertension.  2. Hypertensive encephalopathy.  3. Hypokalemia history.  4. History of chronic kidney disease.  5. Lung carcinoma.  6. Obstructive sleep apnea, not on continuous positive airway pressure.   DISCHARGE CONDITION:  Stable.   DISCHARGE MEDICATIONS:  Crestor 5 mg p.o. at bedtime, Coreg 6.25 mg twice daily, multivitamins with minerals once daily, aspirin 81 mg p.o. daily, and losartan 50 mg p.o. twice daily.   HOME OXYGEN: None.   DIET:  Low-salt, low-fat, low-cholesterol, regular consistency.   ACTIVITY: Limitations.   FOLLOWUP: With Dr. Ronette Deter in 2 days after discharge, Dr. Fletcher Anon, in 2 days after discharge.    CONSULTANTS:  Care management, social work, Dr. Tyrell Antonio PA, Mr. Areta Haber Dunn, [facility]    RADIOLOGIC STUDIES:  Chest x-ray PA and lateral 04/02/2014 showed no acute cardiopulmonary abnormalities. CT scan of the head without contrast 04/02/2014 revealed chronic microvascular disease, no acute intracranial abnormality. Kidney ultrasound 04/03/2014, showed no hydronephrosis bilaterally, no acute abnormalities. Echocardiogram 04/03/2014 showed left ventricular ejection fraction by visual estimation 55% to 60%, normal global left ventricular systolic function, impaired relaxation pattern of LV diastolic filling, moderate concentric LV hypertrophy, mildly dilated left atrium, mild mitral valve regurgitation, moderate mitral annular calcifications, mild aortic valve sclerosis without stenosis. No cardiac source of embolism identified.   HISTORY OF PRESENT ILLNESS:  The patient is a 81 year old female with a history of hypertension, who presents to the hospital with questionable cerebrovascular accident, as well as uncontrolled hypertension.  She was complaining of lightheadedness and feeling "a surge in her body" . She was noted to have high blood pressure. She was seen by Dr. Fletcher Anon, 1 day prior to coming to the hospital, who initiated her on Losartan in addition to her usual dose of carvedilol. However, after taking 1 dose of 50 mg of Losartan night before coming to the ER, she was still complaining of lightheadedness, headaches, as well as feeling her heart was racing and cramping in her left shoulder, also slurring speech.   VITAL SIGNS:  On arrival to the emergency room, the patient's temperature is 97.9, pulse was 61. Her  respiration rate was 12, blood pressure 187/87, saturation was 95% on room air.   PHYSICAL EXAM: Unremarkable.   LABORATORY DATA:  The patient's lab data done on arrival to the hospital revealed a normal BMP, except for a glucose level of 108 and potassium level of 3.3. Cardiac enzymes: Troponin was less than 0.02. TSH was normal at 1.11. CBC was within normal limits. EKG showed normal sinus rhythm at 71 beats per minute, biatrial enlargement, LVH with repolarization abnormality but no acute ST-T changes were noted.   The patient was admitted to the hospital for further evaluation. Attempts to control her blood pressure were initiated with medications, and her blood pressure improved by 04/03/2014. The patient's systolic blood pressure was around 146/70. The patient's lightheadedness, as well as slurred speech has resolved completely with therapy. It was felt that the patient benefit from advancement of her losartan, even higher 50 mg twice daily dose. The patient was advised to continue her medications and follow up and follow her kidney function as outpatient.   In regard to hypokalemia, patient's potassium was supplemented and her potassium level normalized. For her history of lung cancer, as well  as obstructive sleep apnea, the patient is to continue her outpatient medications. The patient was advised to get a sleep  study done as outpatient and to initiate CPAP if it is recommended, since obstructive sleep apnea, which is not treated, can increase her blood pressure significantly.   On the day of discharge, as mentioned above, the patient's vital signs: Temperature was 97.9, pulse was 59, respiration rate was 18, blood pressure 146/71. Saturation was 96 to 98% on room air at rest.   TIME SPENT: 40 minutes.    ____________________________ Theodoro Grist, MD rv:ap D: 04/08/2014 17:12:29 ET T: 04/08/2014 18:01:58 ET JOB#: [id number]  cc:    Theodoro Grist MD ELECTRONICALLY SIGNED 04/18/2014 13:21

## 2014-08-15 ENCOUNTER — Encounter: Payer: Self-pay | Admitting: Internal Medicine

## 2014-09-06 ENCOUNTER — Ambulatory Visit (INDEPENDENT_AMBULATORY_CARE_PROVIDER_SITE_OTHER): Payer: Medicare PPO | Admitting: Cardiovascular Disease

## 2014-09-06 ENCOUNTER — Encounter: Payer: Self-pay | Admitting: Cardiovascular Disease

## 2014-09-06 VITALS — BP 120/60 | HR 64 | Ht 61.0 in | Wt 126.5 lb

## 2014-09-06 DIAGNOSIS — E785 Hyperlipidemia, unspecified: Secondary | ICD-10-CM | POA: Diagnosis not present

## 2014-09-06 DIAGNOSIS — I701 Atherosclerosis of renal artery: Secondary | ICD-10-CM

## 2014-09-06 DIAGNOSIS — I150 Renovascular hypertension: Secondary | ICD-10-CM

## 2014-09-06 NOTE — Progress Notes (Signed)
Primary care physician: Dr. [name]  HPI  This is a 81 year old female who is here today for a follow-up visit regarding hypertension and renal artery stenosis status post left renal artery stenting . Cardiac cath was done in January of this year due to chest pain and shortness of breath and showed no significant coronary artery disease with normal left ventricular end-diastolic pressure. Renal angiography showed severe left renal artery stenosis and 40% ostial right renal artery stenosis. She underwent left renal artery stenting.  Blood pressure improved significantly after that. She was seen in April by Thurmond Butts for elevated blood pressure was lasted for about one week. She underwent renal artery duplex which showed patent left renal artery stent with mild disease in the right renal artery. She has been doing well overall since then with controlled blood pressure. No chest pain or shortness of breath. She checks her blood pressure 3 times daily. Blood pressure tends to elevate when she is anxious.  Allergies  Allergen Reactions  . Atorvastatin Other (See Comments)    Muscle weakness  . Penicillins Hives  . Sulfa Antibiotics Hives     Current Outpatient Prescriptions on File Prior to Visit  Medication Sig Dispense Refill  . Ascorbic Acid (VITAMIN C CR) 1500 MG TBCR Take by mouth daily.    Marland Kitchen aspirin 81 MG tablet Take 81 mg by mouth daily.    . B Complex Vitamins (VITAMIN B COMPLEX PO) Take by mouth.    . carvedilol (COREG) 6.25 MG tablet Take 1 tablet (6.25 mg total) by mouth 2 (two) times daily with a meal. 180 tablet 3  . Cholecalciferol 2000 UNITS TABS Take 1 tablet by mouth daily.    . Fish Oil OIL by Does not apply route.    . fluticasone (FLONASE) 50 MCG/ACT nasal spray Place 1 spray into both nostrils daily as needed.     . hydrALAZINE (APRESOLINE) 25 MG tablet Take 1 tablet (25 mg total) by mouth 3 (three) times daily. (Patient taking differently: Take 25 mg by mouth as needed.  ) 90 tablet 6  . losartan-hydrochlorothiazide (HYZAAR) 100-25 MG per tablet Take 1 tablet by mouth daily. 90 tablet 3  . Lysine HCl 500 MG CAPS Take 1,000 mg by mouth daily as needed (for cold sores).     . pantoprazole (PROTONIX) 40 MG tablet Take 1 tablet (40 mg total) by mouth daily. 90 tablet 3  . rosuvastatin (CRESTOR) 5 MG tablet Take 1 tablet (5 mg total) by mouth daily. 90 tablet 1  . traZODone (DESYREL) 50 MG tablet Take 0.5-1 tablets (25-50 mg total) by mouth at bedtime as needed for sleep. 90 tablet 0  . Zinc 50 MG TABS Take by mouth daily.     No current facility-administered medications on file prior to visit.     Past Medical History  Diagnosis Date  . Hyperlipidemia   . Orthostatic hypotension   . Carotid artery disease   . PONV (postoperative nausea and vomiting)     "when I was younger"  . Hypertension   . OSA on CPAP     "wear it intermittently"  . Sinus headache   . Headache     "lately; related to HTN" (04/24/2014)  . Arthritis     "qwhere"  . Chronic lower back pain   . Lung cancer 2003    s/p left lower lobe resection  . Non-obstructive CAD     a. 04/2014 Cath: LM nl, LAD/D1/LCX/RCA min irregs, RPDA/PAV nl.  Marland Kitchen  Renal artery stenosis     a. 04/2014 Renal angiography: LRA 95p, RRA 40ost. PTA of LRA deferred 2/2 guide cath induced Ao dissection;  01/27 6 x 15 mm Herculink stent to the L-RA      Past Surgical History  Procedure Laterality Date  . Appendectomy    . Total hip arthroplasty Right   . Replacement total knee Left   . Bilateral oophorectomy Bilateral 2000's  . Vaginal delivery  X 2  . Cataract extraction w/ intraocular lens  implant, bilateral Bilateral   . Cardiac catheterization  04/24/2014  . Renal angiogram  04/24/2014  . Joint replacement    . Vaginal hysterectomy  1980's  . Lung lobectomy Left 2003    "lower lobe"  . Tubal ligation  ~ 1975  . Left heart catheterization with coronary angiogram N/A 04/24/2014    Procedure: LEFT HEART  CATHETERIZATION WITH CORONARY ANGIOGRAM;  Surgeon: Wellington Hampshire, MD;  Location: [facility];  Service: Cardiovascular;  Laterality: N/A;  . Renal angiogram N/A 04/24/2014    Procedure: RENAL ANGIOGRAM;  Surgeon: Wellington Hampshire, MD;  Location: [facility];  Service: Cardiovascular;  Laterality: N/A;  . Renal angiogram Left 05/01/2014    Procedure: RENAL ANGIOGRAM;  Surgeon: Wellington Hampshire, MD;  Location: [facility];  Service: Cardiovascular;  Laterality: Left;  . Peripheral vascular catheterization  05/01/2014    Procedure: RENAL INTERVENTION;  Surgeon: Wellington Hampshire, MD; 6 x 15 mm Herculink stent to the Left Renal Artery     Family History  Problem Relation Age of Onset  . Parkinsonism Mother   . Sarcoidosis Brother   . Stroke Maternal Grandmother   . Cancer Paternal Grandmother      History   Social History  . Marital Status: Widowed    Spouse Name: N/A  . Number of Children: N/A  . Years of Education: N/A   Occupational History  . Not on file.   Social History Main Topics  . Smoking status: Never Smoker   . Smokeless tobacco: Never Used  . Alcohol Use: Yes     Comment: 04/24/2014 "glass of beer or wine q couple weeks"  . Drug Use: No  . Sexual Activity: Not on file     Comment: Father and husband both smokers   Other Topics Concern  . Not on file   Social History Narrative   Lives in [place]. Daughter nearby.     ROS A 10 point review of system was performed. It is negative other than that mentioned in the history of present illness.   PHYSICAL EXAM   BP 120/60 mmHg  Pulse 64  Ht '5\' 1"'$  (1.549 m)  Wt 126 lb 8 oz (57.38 kg)  BMI 23.91 kg/m2 Constitutional: She is oriented to person, place, and time. She appears well-developed and well-nourished. No distress.  HENT: No nasal discharge.  Head: Normocephalic and atraumatic.  Eyes: Pupils are equal and round. No discharge.  Neck: Normal range of motion. Neck supple. No JVD present. No thyromegaly  present.  Cardiovascular: Normal rate, regular rhythm, normal heart sounds. Exam reveals no gallop and no friction rub. No murmur heard.  Pulmonary/Chest: Effort normal and breath sounds normal. No stridor. No respiratory distress. She has no wheezes. She has no rales. She exhibits no tenderness.  Abdominal: Soft. Bowel sounds are normal. She exhibits no distension. There is no tenderness. There is no rebound and no guarding.  Musculoskeletal: Normal range of motion. She exhibits no edema  and no tenderness.  Neurological: She is alert and oriented to person, place, and time. Coordination normal.  Skin: Skin is warm and dry. No rash noted. She is not diaphoretic. No erythema. No pallor.  Psychiatric: She has a normal mood and affect. Her behavior is normal. Judgment and thought content normal.      ASSESSMENT AND PLAN

## 2014-09-06 NOTE — Assessment & Plan Note (Signed)
Lab Results  Component Value Date   CHOL 198 02/12/2014   HDL 65.30 02/12/2014   LDLCALC 116* 02/12/2014   LDLDIRECT 201.7 02/05/2013   TRIG 86.0 02/12/2014   CHOLHDL 3 02/12/2014   She seems to be tolerating small dose rosuvastatin.

## 2014-09-06 NOTE — Assessment & Plan Note (Signed)
She is doing very well overall with controlled blood pressure. She uses hydralazine as needed if systolic blood pressure exceeds 160 which has not really been happening frequently over the last 2 months. Renal artery duplex showed patent left renal artery stent. Recommend repeat renal artery duplex in 6 months and continue current medications. I instructed her to cut down on checking her blood pressure which seems to be causing anxiety by itself.

## 2014-09-06 NOTE — Patient Instructions (Signed)
Medication Instructions:  Your physician recommends that you continue on your current medications as directed. Please refer to the Current Medication list given to you today.   Labwork: None  Testing/Procedures: Your physician has requested that you have a renal artery duplex. During this test, an ultrasound is used to evaluate blood flow to the kidneys. Allow one hour for this exam. Do not eat after midnight the day before and avoid carbonated beverages. Take your medications as you usually do.    Follow-Up: Your physician wants you to follow-up in: six months with Dr. Fletcher Anon. You will receive a reminder letter in the mail two months in advance. If you don't receive a letter, please call our office to schedule the follow-up appointment.   Any Other Special Instructions Will Be Listed Below (If Applicable).

## 2014-09-09 ENCOUNTER — Telehealth: Payer: Self-pay | Admitting: Internal Medicine

## 2014-09-09 NOTE — Telephone Encounter (Signed)
Pt stopped by the office after seeing Dr. Daleen Squibb at [facility]. Pt stated that his office has not received xrays reports. Please advise pt/msn

## 2014-09-12 ENCOUNTER — Encounter: Payer: Self-pay | Admitting: Internal Medicine

## 2014-09-24 ENCOUNTER — Telehealth: Payer: Self-pay

## 2014-09-24 NOTE — Telephone Encounter (Signed)
The patient called hoping to get a referral to Dr.Hooten regarding her hip pain.  She states the pain is ongoing and she is hoping a specialist may be able to help.

## 2014-09-24 NOTE — Telephone Encounter (Signed)
Patient made apt for next 30 min slot. Thanks!

## 2014-09-24 NOTE — Telephone Encounter (Signed)
Please make a 73mn follow up appointment

## 2014-10-03 ENCOUNTER — Ambulatory Visit (INDEPENDENT_AMBULATORY_CARE_PROVIDER_SITE_OTHER): Payer: Medicare PPO | Admitting: Nurse Practitioner

## 2014-10-03 VITALS — BP 148/64 | HR 64 | Temp 98.5°F | Resp 14 | Ht 61.0 in | Wt 125.8 lb

## 2014-10-03 DIAGNOSIS — M47815 Spondylosis without myelopathy or radiculopathy, thoracolumbar region: Secondary | ICD-10-CM | POA: Diagnosis not present

## 2014-10-03 DIAGNOSIS — R059 Cough, unspecified: Secondary | ICD-10-CM

## 2014-10-03 DIAGNOSIS — R05 Cough: Secondary | ICD-10-CM | POA: Diagnosis not present

## 2014-10-03 DIAGNOSIS — L609 Nail disorder, unspecified: Secondary | ICD-10-CM

## 2014-10-03 DIAGNOSIS — Z96641 Presence of right artificial hip joint: Secondary | ICD-10-CM

## 2014-10-03 DIAGNOSIS — L608 Other nail disorders: Secondary | ICD-10-CM

## 2014-10-03 MED ORDER — FLUTICASONE PROPIONATE 50 MCG/ACT NA SUSP: 1.0000 | Freq: Every day | NASAL | Status: DC | PRN

## 2014-10-03 NOTE — Progress Notes (Signed)
Pre visit review using our clinic review tool, if applicable. No additional management support is needed unless otherwise documented below in the visit note. 

## 2014-10-03 NOTE — Progress Notes (Signed)
   Subjective:    Patient ID: Tamara Burton, female    DOB: Aug 22, 1933, 81 y.o.   MRN: [id number]  HPI  Ms. Tamara Burton is a 81 yo female with a CC of cough and needing a referral to ortho.   1) Cough- Bronchitis in the fall and never recovered per pt. ENT gave her flonase. Chronic cough since last fall. Comes and goes, last coughing spell was yesterday. Feels tickle in throat and then she goes into a coughing spell, not worse at certain times of the day or night, no triggers that she knows of. Flonase helpful for pndrip.  Delsym- as needed, helpful   CT angio chest- no PE or other pulmonary disease seen except for minimal bilateral atelectasis   2) Hip pain, thoracic degenerative disease, X-ray lumbar spine 04/18/14 shows right hip arthroplasty  [facility]- 1999 approx per pt   3) Right toenail- x 1 year started with a "sore" and pain of the right great toe, then it progressed to color change of the medial half of the right toenail. No pain currently.   Review of Systems  Constitutional: Negative for fever, chills, diaphoresis and fatigue.  Respiratory: Negative for chest tightness, shortness of breath and wheezing.   Cardiovascular: Negative for chest pain, palpitations and leg swelling.  Gastrointestinal: Negative for nausea, vomiting and diarrhea.  Skin: Negative for rash.  Neurological: Negative for dizziness, weakness, numbness and headaches.  Psychiatric/Behavioral: The patient is not nervous/anxious.       Objective:   Physical Exam  Constitutional: She is oriented to person, place, and time. She appears well-developed and well-nourished. No distress.  BP 148/64 mmHg  Pulse 64  Temp(Src) 98.5 F (36.9 C)  Resp 14  Ht '5\' 1"'$  (1.549 m)  Wt 125 lb 12.8 oz (57.063 kg)  BMI 23.78 kg/m2  SpO2 97%   HENT:  Head: Normocephalic and atraumatic.  Right Ear: External ear normal.  Left Ear: External ear normal.  Cardiovascular: Normal rate, regular rhythm, normal heart sounds and intact  distal pulses.  Exam reveals no gallop and no friction rub.   No murmur heard. Pulmonary/Chest: Effort normal and breath sounds normal. No respiratory distress. She has no wheezes. She has no rales. She exhibits no tenderness.  Neurological: She is alert and oriented to person, place, and time. No cranial nerve deficit. She exhibits normal muscle tone. Coordination normal.  Skin: Skin is warm and dry. No rash noted. She is not diaphoretic.  Psychiatric: She has a normal mood and affect. Her behavior is normal. Judgment and thought content normal.      Assessment & Plan:

## 2014-10-03 NOTE — Patient Instructions (Signed)
Continue with Flonase daily and Delsym as needed.   We will contact you about your referrals to orthopedics and for podiatry. This may take 1-2 weeks to coordinate.

## 2014-10-16 ENCOUNTER — Encounter: Payer: Self-pay | Admitting: Nurse Practitioner

## 2014-10-16 DIAGNOSIS — Z96641 Presence of right artificial hip joint: Secondary | ICD-10-CM | POA: Insufficient documentation

## 2014-10-16 DIAGNOSIS — L608 Other nail disorders: Secondary | ICD-10-CM | POA: Insufficient documentation

## 2014-10-16 DIAGNOSIS — R05 Cough: Secondary | ICD-10-CM | POA: Insufficient documentation

## 2014-10-16 DIAGNOSIS — R059 Cough, unspecified: Secondary | ICD-10-CM | POA: Insufficient documentation

## 2014-10-16 NOTE — Assessment & Plan Note (Signed)
Continuing to have hip pain. S/p right hip arthroplasty.

## 2014-10-16 NOTE — Assessment & Plan Note (Addendum)
Pt has hx of lung CA, bronchitis, and negative for PE on scan. Refilled flonase and asked her to continue delsym as needed.

## 2014-10-16 NOTE — Assessment & Plan Note (Signed)
Stable. Will refer to podiatry for further evaluation and treatment.

## 2014-10-23 ENCOUNTER — Ambulatory Visit: Payer: Medicare PPO | Admitting: Internal Medicine

## 2014-10-31 ENCOUNTER — Encounter: Payer: Self-pay | Admitting: Podiatry

## 2014-10-31 ENCOUNTER — Ambulatory Visit (INDEPENDENT_AMBULATORY_CARE_PROVIDER_SITE_OTHER): Payer: Medicare PPO | Admitting: Podiatry

## 2014-10-31 VITALS — BP 155/71 | HR 68 | Resp 17

## 2014-10-31 DIAGNOSIS — M79676 Pain in unspecified toe(s): Secondary | ICD-10-CM

## 2014-10-31 DIAGNOSIS — B351 Tinea unguium: Secondary | ICD-10-CM | POA: Diagnosis not present

## 2014-10-31 DIAGNOSIS — L603 Nail dystrophy: Secondary | ICD-10-CM | POA: Diagnosis not present

## 2014-10-31 NOTE — Progress Notes (Signed)
   Subjective:    Patient ID: Tamara Burton, female    DOB: [birth year], 81 y.o.   MRN: [id number]  HPI 81 year old female presents the office they with splitting, cracking of her right hallux toenail. She is also says no some discoloration within the toenail. She denies any pain or any redness or drainage from the nail site. She was concerned it may have started to become an ingrown toenail however she states it is not noted to that time. She's had no prior treatment. She also states that the remainder of her nails are also elongated thickened she is unable to trim them herself. Denies any redness or drainage in the nail sites. No other complaints at this time.   Review of Systems  HENT:       Sinus problem Hearing loss   Eyes: Positive for itching.  Respiratory: Positive for cough.   Endocrine:       Excessive thrist   Musculoskeletal: Positive for back pain.       Joint pain   Skin:       Change in nail  Allergic/Immunologic: Positive for environmental allergies.  Hematological: Bruises/bleeds easily.  All other systems reviewed and are negative.      Objective:   Physical Exam AAO x3, NAD DP/PT pulses palpable bilaterally, CRT less than 3 seconds Protective sensation intact with Simms Weinstein monofilament, vibratory sensation intact, Achilles tendon reflex intact The medial border of the right hallux toenail is discolored with some dark discoloration within the nail and also appears to be splitting and cracking. The remaining nails are also hypertrophic, dystrophic, discolored. There is tenderness palpation overlying nails modified bilaterally. There is no swelling erythema or drainage. There is no evidence of incurvation along the right hallux toenail however there is evidence of aggravation along the remaining of the toenails. No other areas of tenderness to bilateral lower extremities. MMT 5/5, ROM WNL.  No open lesions or pre-ulcerative lesions.  No overlying edema,  erythema, increase in warmth to bilateral lower extremities.  No pain with calf compression, swelling, warmth, erythema bilaterally.      Assessment & Plan:  81 year old female with right hallux onychodystrophy, likely onychomycosis to remaining nails.  -Treatment options discussed including all alternatives, risks, and complications -Right hallux toenail was debrided and sent for biopsy. It was sent to [facility] labs for evaluation.  -Remainder of the nails are sharply debrided without complication/bleeding 10.  -Follow-up after nail biopsy results or sooner if any problems arise. In the meantime, encouraged to call the office with any questions, concerns, change in symptoms. Follow-up with PCP for other isses mentioned in the ROS. -She would like to get on a regular schedule for nail debridement as she is unable to do this herself.   Celesta Gentile, DPM

## 2014-11-06 ENCOUNTER — Other Ambulatory Visit: Payer: Self-pay | Admitting: Internal Medicine

## 2014-11-21 ENCOUNTER — Encounter: Payer: Self-pay | Admitting: Podiatry

## 2014-11-21 ENCOUNTER — Ambulatory Visit (INDEPENDENT_AMBULATORY_CARE_PROVIDER_SITE_OTHER): Payer: Medicare PPO | Admitting: Podiatry

## 2014-11-21 VITALS — BP 161/68 | HR 63 | Resp 18

## 2014-11-21 DIAGNOSIS — B351 Tinea unguium: Secondary | ICD-10-CM | POA: Diagnosis not present

## 2014-11-21 MED ORDER — CICLOPIROX 8 % EX SOLN: Freq: Every day | CUTANEOUS | Status: DC

## 2014-11-21 NOTE — Progress Notes (Signed)
Patient ID: Tamara Burton, female   DOB: Jul 10, 1933, 81 y.o.   MRN: [id number]  Subjective: 81 year old female presents the office discussed nail biopsy results. She states that she continues have no pain around the toenail this time denies any redness or drainage. No acute changes since last appointment and no other complaints.  Objective: AAO 3, NAD Neurovascular status unchanged Nails are hypertrophic, dystrophic, discolored, brittle. There is no surrounding erythema or drainage on the nail sites. There is no tenderness palpation around the nails at this time. No other areas of tenderness to bilateral lower extremities. No overlying edema, erythema, increase in warmth. No open lesions or pre-ulcerative lesions. No pain with calf compression, swelling, warmth, erythema.  Assessment: 81 year old female with onychomycosis  Plan: Nail biopsy results were discussed the patient which revealed onychomycosis. Discussed with her various treatment options. She would proceed with topical knowing that it may not be the best treatment for her particular kind a fungus. We'll prescribe Penlac due to insurance. Discussed application instructions as well as side effects and directed to call the office if any are to occur. Follow-up in the near future sooner any problems are to arise. Call any questions, concerns, change in symptoms.  Celesta Gentile, DPM

## 2014-11-21 NOTE — Patient Instructions (Signed)

## 2014-12-19 ENCOUNTER — Encounter: Payer: Self-pay | Admitting: Internal Medicine

## 2015-01-05 ENCOUNTER — Encounter: Payer: Self-pay | Admitting: Internal Medicine

## 2015-01-13 ENCOUNTER — Telehealth: Payer: Self-pay | Admitting: *Deleted

## 2015-01-13 NOTE — Telephone Encounter (Signed)
Pt states the big toe with fungus, is not red, swollen and painful since yesterday, has used hot salt water soaks without relief.  I told pt I felt she would benefit from an earlier appt, transferred to schedulersl.

## 2015-01-14 ENCOUNTER — Ambulatory Visit (INDEPENDENT_AMBULATORY_CARE_PROVIDER_SITE_OTHER): Payer: Medicare PPO | Admitting: Podiatry

## 2015-01-14 ENCOUNTER — Encounter: Payer: Self-pay | Admitting: Podiatry

## 2015-01-14 VITALS — BP 106/60 | HR 63 | Temp 97.7°F | Resp 18

## 2015-01-14 DIAGNOSIS — L03031 Cellulitis of right toe: Secondary | ICD-10-CM | POA: Diagnosis not present

## 2015-01-14 DIAGNOSIS — L03011 Cellulitis of right finger: Secondary | ICD-10-CM

## 2015-01-14 DIAGNOSIS — L601 Onycholysis: Secondary | ICD-10-CM

## 2015-01-14 MED ORDER — CLINDAMYCIN HCL 300 MG PO CAPS: 300.0000 mg | ORAL_CAPSULE | Freq: Three times a day (TID) | ORAL | Status: DC

## 2015-01-14 NOTE — Patient Instructions (Signed)

## 2015-01-14 NOTE — Progress Notes (Signed)
Patient ID: Tamara Burton, female   DOB: 1933/04/30, 81 y.o.   MRN: [id number]  Subjective: 81 year old female presents the office with concerns of the right hallux becoming red and swollen and painful over the last 2 days. She states that she was using the Penlac daily for nail fungus. She states a couple days his redness or toenail becomes more painful and symptoms become more swollen and red. She denies any drainage or pus coming from the toenail she denies any red streaks. She did start to soak her foot in salt water, but no other recent treatments. No systemic complaints such as fevers, chills, nausea, vomiting. No calf pain, chest pain, shortness of breath. No other complaints at this time in no acute changes otherwise.  Objective: AAO 3, NAD DP/PT pulses palpable, CRT less than 3 seconds Protective sensation appears to be intact with Derrel Nip monofilament There is erythema to the right hallux as well as edema from the distal aspect just proximal to the IPJ. Erythema does not extend past the MPJ. There is no ascending cellulitis. The toenail appears to be loose and the underlying nail bed and there does appear to be a small amount of pus in the proximal nail border. There is incurvation on both the medial and lateral nail borders. There are no areas of fluctuance or crepitus. There is no malodor. There is no other areas of tenderness to bilateral lower extremity is. There is no other areas of edema, erythema, increase in warmth. No pain with calf compression, swelling, warmth, erythema.  Assessment: 81 year old female with right hallux onycholysis, paronychia  Plan: -Treatment options discussed including all alternatives, risks, and complications -At this time, recommended partial nail removal without chemical matricectomy to the right hallux toenail due to infection. Risks and complications were discussed with the patient for which they understand and  verbally consent to the procedure.  Under sterile conditions a total of 3 mL of a mixture of 2% lidocaine plain and 0.5% Marcaine plain was infiltrated in a hallux block fashion. Once anesthetized, the skin was prepped in sterile fashion. A tourniquet was then applied. Next the right hallux toenail was excised in total making sure to remove the entire offending nail border. There was purulence noted on the proximal nail border. Once the nail was removed, the area was debrided and the underlying skin was intact. No further purulence was expressed. The area was irrigated and hemostasis was obtained.  A dry sterile dressing was applied. After application of the dressing the tourniquet was removed and there is found to be an immediate capillary refill time to the digit. The patient tolerated the procedure well any complications. Post procedure instructions were discussed the patient for which he verbally understood. Follow-up in one week for nail check or sooner if any problems are to arise. Discussed signs/symptoms of worsening infection and directed to call the office immediately should any occur or go directly to the emergency room. In the meantime, encouraged to call the office with any questions, concerns, changes symptoms. -Prescribed clindamycin. She states that she has previously been on this medication without any side effects.  Tamara Burton, DPM

## 2015-01-15 ENCOUNTER — Telehealth: Payer: Self-pay | Admitting: *Deleted

## 2015-01-15 MED ORDER — CLINDAMYCIN HCL 300 MG PO CAPS: 300.0000 mg | ORAL_CAPSULE | Freq: Three times a day (TID) | ORAL | Status: DC

## 2015-01-15 NOTE — Telephone Encounter (Addendum)
Pt states had a toenail removed yesterday, an antibiotic was to be [facility] to [facility] [address], but was not there.  Informed pt [facility].  Spoke with Louie Casa at [facility], he states the Clindamycin was sent out this morning, possibly the [facility] pharmacy may not bill the insurance company since the Clindamycin had been billed from the [employer].  I told Louie Casa, I would inform the pt and see if she would prefer to wait for the mailed Clindamycin, if [facility] was to charge her without billing the insurance company.  Informed pt and she asked would it be okay to wait on the mailed Clindamycin, I told her to continue the soaking and topical antibiotic that Dr. Jacqualyn Posey had ordered yesterday, and I would send Dr. Jacqualyn Posey a note and if he wanted her to begin the Clindamycin now, I would call again.  Informed pt of Dr. Leigh Aurora orders.

## 2015-01-15 NOTE — Telephone Encounter (Signed)
She can start the clinda when gets it

## 2015-01-21 ENCOUNTER — Ambulatory Visit (INDEPENDENT_AMBULATORY_CARE_PROVIDER_SITE_OTHER): Payer: Medicare PPO | Admitting: Podiatry

## 2015-01-21 ENCOUNTER — Encounter: Payer: Self-pay | Admitting: Podiatry

## 2015-01-21 ENCOUNTER — Ambulatory Visit: Payer: Medicare PPO | Admitting: Podiatry

## 2015-01-21 DIAGNOSIS — Z9889 Other specified postprocedural states: Secondary | ICD-10-CM

## 2015-01-21 DIAGNOSIS — L03011 Cellulitis of right finger: Secondary | ICD-10-CM

## 2015-01-21 DIAGNOSIS — L03031 Cellulitis of right toe: Secondary | ICD-10-CM

## 2015-01-21 NOTE — Progress Notes (Signed)
Patient ID: Tamara Burton, female   DOB: 02/13/1934, 81 y.o.   MRN: [id number]  Subjective: Tamara Burton is a 81 y.o. female who presents to the office today 1 week s/p right hallux nail avulsion due to infection. She states overall she is doing well. She continues to soak her foot in Epson salt soaks twice a day cover with interbody ointment and a Band-Aid. She states the first couple days was little sore habits pain has decreased. She states of the redness surrounding the toe has decreased. She denies any drainage or pus. Denies any red streaks. She's been taking the antibiotics as directed without any side effects. No other complaints at this time. She denies any systemic complaints such as fevers, chills, nausea, vomiting. No calf pain, chest pain, shortness of breath.  Objective: AAO 3, NAD DP/PT pulses 2/4, CRT less than 3 seconds Neurological status unchanged Status post right hallux total nail avulsion which is healing well. There is granulation tissue numbness Along the procedure site. There is no specific tenderness to palpation on the procedure site. The erythema around the toe has decreased. There is no ascending cellulitis. No areas of fluctuance or crepitation. No malodor. No drainage or purulence. No other areas of tenderness to bilateral lower extremities. No other areas of edema, erythema, increase in warmth. No pain with calf compression, swelling, warmth, erythema.  Assessment: 81 year old female 1 week status post right hallux total nail avulsion, healing well  Plan: Continue soaking in epsom salts twice a day followed by antibiotic ointment and a band-aid. Can leave uncovered at night. Continue this until completely healed.  Finish course of antibiotics  Hold off on restarting Penlac until new nail starts to grow out. If the area has not healed in 2 weeks, call the office for follow-up appointment, or sooner if any problems arise.  Monitor for any signs/symptoms of  infection. Call the office immediately if any occur or go directly to the emergency room. Call with any questions/concerns.  Celesta Gentile, DPM

## 2015-02-11 ENCOUNTER — Ambulatory Visit (INDEPENDENT_AMBULATORY_CARE_PROVIDER_SITE_OTHER): Payer: Medicare PPO

## 2015-02-11 DIAGNOSIS — I701 Atherosclerosis of renal artery: Secondary | ICD-10-CM | POA: Diagnosis not present

## 2015-02-13 ENCOUNTER — Other Ambulatory Visit: Payer: Self-pay

## 2015-02-13 DIAGNOSIS — I701 Atherosclerosis of renal artery: Secondary | ICD-10-CM

## 2015-02-18 ENCOUNTER — Ambulatory Visit (INDEPENDENT_AMBULATORY_CARE_PROVIDER_SITE_OTHER): Payer: Medicare PPO | Admitting: Cardiovascular Disease

## 2015-02-18 ENCOUNTER — Encounter: Payer: Self-pay | Admitting: Cardiovascular Disease

## 2015-02-18 VITALS — BP 110/60 | HR 64 | Ht 61.0 in | Wt 128.0 lb

## 2015-02-18 DIAGNOSIS — E785 Hyperlipidemia, unspecified: Secondary | ICD-10-CM

## 2015-02-18 DIAGNOSIS — I701 Atherosclerosis of renal artery: Secondary | ICD-10-CM

## 2015-02-18 DIAGNOSIS — I150 Renovascular hypertension: Secondary | ICD-10-CM

## 2015-02-18 NOTE — Assessment & Plan Note (Signed)
Blood pressure is now well controlled on current medications. 

## 2015-02-18 NOTE — Patient Instructions (Signed)
Medication Instructions: Continue same medications.   Labwork: None.   Procedures/Testing: None.   Follow-Up: 6 months with Dr. Arida.   Any Additional Special Instructions Will Be Listed Below (If Applicable).   

## 2015-02-18 NOTE — Assessment & Plan Note (Signed)
Renal stent was patent on recent duplex and blood pressure is reasonably controlled on current medication. I made no changes and will see her back in 6 months. Will repeat renal artery duplex in 1 year.

## 2015-02-18 NOTE — Progress Notes (Signed)
Primary care physician: Dr. [name]  HPI  This is a 81 year old female who is here today for a follow-up visit regarding hypertension and renal artery stenosis status post left renal artery stenting in 04/2014 . Cardiac cath was done in January of this year due to chest pain and shortness of breath and showed no significant coronary artery disease with normal left ventricular end-diastolic pressure.   she has been doing very well with no recurrent chest pain or shortness of breath. Blood pressure has been mostly well controlled except for occasional high readings that usually respond to as needed hydralazine.  renal artery duplex this month showed patent left renal artery stent.   Allergies  Allergen Reactions  . Atorvastatin Other (See Comments)    Muscle weakness  . Penicillins Hives  . Sulfa Antibiotics Hives  . Amlodipine     Weakness and confusion.      Current Outpatient Prescriptions on File Prior to Visit  Medication Sig Dispense Refill  . Ascorbic Acid (VITAMIN C CR) 1500 MG TBCR Take by mouth daily.    Marland Kitchen aspirin 81 MG tablet Take 81 mg by mouth daily.    . B Complex Vitamins (VITAMIN B COMPLEX PO) Take by mouth.    . Calcium-Vitamin D 600-200 MG-UNIT per tablet Take by mouth.    . carvedilol (COREG) 6.25 MG tablet Take 1 tablet (6.25 mg total) by mouth 2 (two) times daily with a meal. 180 tablet 3  . Cholecalciferol 2000 UNITS TABS Take 1 tablet by mouth daily.    . ciclopirox (PENLAC) 8 % solution Apply topically at bedtime. Apply over nail and surrounding skin. Apply daily over previous coat. After seven (7) days, may remove with alcohol and continue cycle. 6.6 mL 4  . fexofenadine (ALLEGRA) 180 MG tablet Take by mouth.    . Fish Oil OIL by Does not apply route.    . fluticasone (FLONASE) 50 MCG/ACT nasal spray Place 1 spray into both nostrils daily as needed. 16 g 2  . hydrALAZINE (APRESOLINE) 25 MG tablet Take 1 tablet (25 mg total) by mouth 3 (three) times  daily. (Patient taking differently: Take 25 mg by mouth as needed. ) 90 tablet 6  . ibuprofen (ADVIL,MOTRIN) 200 MG tablet Take by mouth.    . losartan-hydrochlorothiazide (HYZAAR) 100-25 MG per tablet Take 1 tablet by mouth daily. 90 tablet 3  . Lysine HCl 500 MG CAPS Take 1,000 mg by mouth daily as needed (for cold sores).     . meloxicam (MOBIC) 7.5 MG tablet Take 7.5 mg by mouth daily.     . Omega-3 Fatty Acids (FISH OIL) 1200 MG CAPS Take by mouth.    . pantoprazole (PROTONIX) 40 MG tablet Take 1 tablet (40 mg total) by mouth daily. 90 tablet 3  . rosuvastatin (CRESTOR) 5 MG tablet Take 5 mg by mouth.     . traZODone (DESYREL) 50 MG tablet Take 0.5-1 tablets (25-50 mg total) by mouth at bedtime as needed for sleep. 90 tablet 0  . vitamin C (ASCORBIC ACID) 500 MG tablet Take by mouth.    . Zinc 50 MG TABS Take by mouth daily.     No current facility-administered medications on file prior to visit.     Past Medical History  Diagnosis Date  . Hyperlipidemia   . Orthostatic hypotension   . Carotid artery disease ([place])   . PONV (postoperative nausea and vomiting)     "when I was younger"  . Hypertension   .  OSA on CPAP     "wear it intermittently"  . Sinus headache   . Headache     "lately; related to HTN" (04/24/2014)  . Arthritis     "qwhere"  . Chronic lower back pain   . Lung cancer ([place]) 2003    s/p left lower lobe resection  . Non-obstructive CAD     a. 04/2014 Cath: LM nl, LAD/D1/LCX/RCA min irregs, RPDA/PAV nl.  . Renal artery stenosis ([place])     a. 04/2014 Renal angiography: LRA 95p, RRA 40ost. PTA of LRA deferred 2/2 guide cath induced Ao dissection;  01/27 6 x 15 mm Herculink stent to the L-RA      Past Surgical History  Procedure Laterality Date  . Appendectomy    . Total hip arthroplasty Right   . Replacement total knee Left   . Bilateral oophorectomy Bilateral 2000's  . Vaginal delivery  X 2  . Cataract extraction w/ intraocular lens  implant, bilateral  Bilateral   . Cardiac catheterization  04/24/2014  . Renal angiogram  04/24/2014  . Joint replacement    . Vaginal hysterectomy  1980's  . Lung lobectomy Left 2003    "lower lobe"  . Tubal ligation  ~ 1975  . Left heart catheterization with coronary angiogram N/A 04/24/2014    Procedure: LEFT HEART CATHETERIZATION WITH CORONARY ANGIOGRAM;  Surgeon: Wellington Hampshire, MD;  Location: [facility] C[facility];  Service: Cardiovascular;  Laterality: N/A;  . Renal angiogram N/A 04/24/2014    Procedure: RENAL ANGIOGRAM;  Surgeon: Wellington Hampshire, MD;  Location: [facility];  Service: Cardiovascular;  Laterality: N/A;  . Renal angiogram Left 05/01/2014    Procedure: RENAL ANGIOGRAM;  Surgeon: Wellington Hampshire, MD;  Location: [facility] C[facility];  Service: Cardiovascular;  Laterality: Left;  . Peripheral vascular catheterization  05/01/2014    Procedure: RENAL INTERVENTION;  Surgeon: Wellington Hampshire, MD; 6 x 15 mm Herculink stent to the Left Renal Artery     Family History  Problem Relation Age of Onset  . Parkinsonism Mother   . Sarcoidosis Brother   . Stroke Maternal Grandmother   . Cancer Paternal Grandmother      Social History   Social History  . Marital Status: Widowed    Spouse Name: N/A  . Number of Children: N/A  . Years of Education: N/A   Occupational History  . Not on file.   Social History Main Topics  . Smoking status: Never Smoker   . Smokeless tobacco: Never Used  . Alcohol Use: Yes     Comment: 04/24/2014 "glass of beer or wine q couple weeks"  . Drug Use: No  . Sexual Activity: Not on file     Comment: Father and husband both smokers   Other Topics Concern  . Not on file   Social History Narrative   Lives in [place]. Daughter nearby.     ROS A 10 point review of system was performed. It is negative other than that mentioned in the history of present illness.   PHYSICAL EXAM   BP 110/60 mmHg  Pulse 64  Ht '5\' 1"'$  (1.549 m)  Wt 128 lb (58.06 kg)  BMI 24.20 kg/m2  LMP   (LMP Unknown) Constitutional: She is oriented to person, place, and time. She appears well-developed and well-nourished. No distress.  HENT: No nasal discharge.  Head: Normocephalic and atraumatic.  Eyes: Pupils are equal and round. No discharge.  Neck: Normal range of motion. Neck supple. No JVD  present. No thyromegaly present.  Cardiovascular: Normal rate, regular rhythm, normal heart sounds. Exam reveals no gallop and no friction rub. No murmur heard.  Pulmonary/Chest: Effort normal and breath sounds normal. No stridor. No respiratory distress. She has no wheezes. She has no rales. She exhibits no tenderness.  Abdominal: Soft. Bowel sounds are normal. She exhibits no distension. There is no tenderness. There is no rebound and no guarding.  Musculoskeletal: Normal range of motion. She exhibits no edema and no tenderness.  Neurological: She is alert and oriented to person, place, and time. Coordination normal.  Skin: Skin is warm and dry. No rash noted. She is not diaphoretic. No erythema. No pallor.  Psychiatric: She has a normal mood and affect. Her behavior is normal. Judgment and thought content normal.      ASSESSMENT AND PLAN

## 2015-02-18 NOTE — Assessment & Plan Note (Signed)
Lab Results  Component Value Date   CHOL 177 04/03/2014   HDL 62* 04/03/2014   LDLCALC 87 04/03/2014   LDLDIRECT 201.7 02/05/2013   TRIG 138 04/03/2014   CHOLHDL 3 02/12/2014   She is tolerating small dose rosuvastatin.

## 2015-02-19 ENCOUNTER — Encounter: Payer: Self-pay | Admitting: Internal Medicine

## 2015-02-19 ENCOUNTER — Ambulatory Visit (INDEPENDENT_AMBULATORY_CARE_PROVIDER_SITE_OTHER): Payer: Medicare PPO | Admitting: Internal Medicine

## 2015-02-19 VITALS — BP 169/70 | HR 59 | Temp 98.0°F | Ht 59.0 in | Wt 129.0 lb

## 2015-02-19 DIAGNOSIS — Z23 Encounter for immunization: Secondary | ICD-10-CM

## 2015-02-19 DIAGNOSIS — Z0000 Encounter for general adult medical examination without abnormal findings: Secondary | ICD-10-CM | POA: Insufficient documentation

## 2015-02-19 LAB — CBC WITH DIFFERENTIAL/PLATELET
BASOS ABS: 0 10*3/uL (ref 0.0–0.1)
Basophils Relative: 0.6 % (ref 0.0–3.0)
EOS ABS: 0.2 10*3/uL (ref 0.0–0.7)
Eosinophils Relative: 2.4 % (ref 0.0–5.0)
HCT: 36.1 % (ref 36.0–46.0)
Hemoglobin: 11.8 g/dL — ABNORMAL LOW (ref 12.0–15.0)
LYMPHS ABS: 2.4 10*3/uL (ref 0.7–4.0)
Lymphocytes Relative: 36.7 % (ref 12.0–46.0)
MCHC: 32.7 g/dL (ref 30.0–36.0)
MCV: 89.3 fl (ref 78.0–100.0)
MONOS PCT: 9.3 % (ref 3.0–12.0)
Monocytes Absolute: 0.6 10*3/uL (ref 0.1–1.0)
NEUTROS ABS: 3.4 10*3/uL (ref 1.4–7.7)
Neutrophils Relative %: 51 % (ref 43.0–77.0)
PLATELETS: 245 10*3/uL (ref 150.0–400.0)
RBC: 4.04 Mil/uL (ref 3.87–5.11)
RDW: 12.9 % (ref 11.5–15.5)
WBC: 6.7 10*3/uL (ref 4.0–10.5)

## 2015-02-19 LAB — COMPREHENSIVE METABOLIC PANEL
ALT: 16 U/L (ref 0–35)
AST: 23 U/L (ref 0–37)
Albumin: 4.3 g/dL (ref 3.5–5.2)
Alkaline Phosphatase: 95 U/L (ref 39–117)
BILIRUBIN TOTAL: 0.6 mg/dL (ref 0.2–1.2)
BUN: 16 mg/dL (ref 6–23)
CALCIUM: 9.5 mg/dL (ref 8.4–10.5)
CHLORIDE: 100 meq/L (ref 96–112)
CO2: 33 meq/L — AB (ref 19–32)
Creatinine, Ser: 0.81 mg/dL (ref 0.40–1.20)
GFR: 72.4 mL/min (ref >60.00)
Glucose, Bld: 85 mg/dL (ref 70–99)
Potassium: 4.3 mEq/L (ref 3.5–5.1)
Sodium: 139 mEq/L (ref 135–145)
Total Protein: 6.4 g/dL (ref 6.0–8.3)

## 2015-02-19 LAB — LIPID PANEL
CHOL/HDL RATIO: 3
Cholesterol: 186 mg/dL (ref 0–200)
HDL: 68.9 mg/dL (ref >39.00)
LDL Cholesterol: 101 mg/dL — ABNORMAL HIGH (ref 0–99)
NonHDL: 116.68
TRIGLYCERIDES: 76 mg/dL (ref 0.0–149.0)
VLDL: 15.2 mg/dL (ref 0.0–40.0)

## 2015-02-19 NOTE — Progress Notes (Signed)
Subjective:    Patient ID: Tamara Burton, female    DOB: [birth year], 81 y.o.   MRN: [id number]  HPI  [name] for annual Medicare wellness examination and management of other chronic and acute problems.   The risk factors are reflected in the social history.  The roster of all physicians providing medical care to patient - is listed in the Snapshot section of the chart.  Activities of daily living:  The patient is 100% independent in all ADLs: dressing, toileting, feeding as well as independent mobility. Lives in [place]. Dog in home.  Home safety : The patient has smoke detectors in the home. They wear seatbelts.  There are no firearms at home. There is no violence in the home.   There is no risks for hepatitis, STDs or HIV. There is no history of blood transfusion. They have no travel history to infectious disease endemic areas of the world.  The patient has seen their dentist in the last six month Bing Neighbors). They have seen their eye doctor in the last year Placido Sou).  Occasional noted loss of hearing bilaterally. Has hearing aids. Occasional ringing in ears. Followed by audiology.  They do not  have excessive sun exposure. Discussed the need for sun protection: hats, long sleeves and use of sunscreen if there is significant sun exposure.   Diet: the importance of a healthy diet is discussed. They do have a healthy diet.  The benefits of regular aerobic exercise were discussed. She walks daily either outside or treadmill.  HCPOA - in place.  Depression screen: there are no signs or vegative symptoms of depression- irritability, change in appetite, anhedonia, sadness/tearfullness.  Cognitive assessment: the patient manages all their financial and personal affairs and is actively engaged. They could relate day,date,year and events.  The following portions of the patient's history were reviewed and updated as appropriate: allergies, current medications, past  family history, past medical history,  past surgical history, past social history  and problem list.  Visual acuity was not assessed per patient preference since she has regular follow up with her ophthalmologist. Hearing and body mass index were assessed and reviewed.   During the course of the visit the patient was educated and counseled about appropriate screening and preventive services including : fall prevention , diabetes screening, nutrition counseling, colorectal cancer screening, and recommended immunizations.     HTN - Occasional high readings with systolic [id number]. Takes Hydralazine during these episodes.  Wt Readings from Last 3 Encounters:  02/19/15 129 lb (58.514 kg)  02/18/15 128 lb (58.06 kg)  10/03/14 125 lb 12.8 oz (57.063 kg)   BP Readings from Last 3 Encounters:  02/19/15 169/70  02/18/15 110/60  01/14/15 106/60    Past Medical History  Diagnosis Date  . Hyperlipidemia   . Orthostatic hypotension   . Carotid artery disease ([place])   . PONV (postoperative nausea and vomiting)     "when I was younger"  . Hypertension   . OSA on CPAP     "wear it intermittently"  . Sinus headache   . Headache     "lately; related to HTN" (04/24/2014)  . Arthritis     "qwhere"  . Chronic lower back pain   . Lung cancer ([place]) 2003    s/p left lower lobe resection  . Non-obstructive CAD     a. 04/2014 Cath: LM nl, LAD/D1/LCX/RCA min irregs, RPDA/PAV nl.  . Renal artery stenosis ([place])     a. 04/2014  Renal angiography: LRA 95p, RRA 40ost. PTA of LRA deferred 2/2 guide cath induced Ao dissection;  01/27 6 x 15 mm Herculink stent to the L-RA    Family History  Problem Relation Age of Onset  . Parkinsonism Mother   . Sarcoidosis Brother   . Stroke Maternal Grandmother   . Cancer Paternal Grandmother    Past Surgical History  Procedure Laterality Date  . Appendectomy    . Total hip arthroplasty Right   . Replacement total knee Left   . Bilateral oophorectomy Bilateral  2000's  . Vaginal delivery  X 2  . Cataract extraction w/ intraocular lens  implant, bilateral Bilateral   . Cardiac catheterization  04/24/2014  . Renal angiogram  04/24/2014  . Joint replacement    . Vaginal hysterectomy  1980's  . Lung lobectomy Left 2003    "lower lobe"  . Tubal ligation  ~ 1975  . Left heart catheterization with coronary angiogram N/A 04/24/2014    Procedure: LEFT HEART CATHETERIZATION WITH CORONARY ANGIOGRAM;  Surgeon: Wellington Hampshire, MD;  Location: [facility];  Service: Cardiovascular;  Laterality: N/A;  . Renal angiogram N/A 04/24/2014    Procedure: RENAL ANGIOGRAM;  Surgeon: Wellington Hampshire, MD;  Location: [facility];  Service: Cardiovascular;  Laterality: N/A;  . Renal angiogram Left 05/01/2014    Procedure: RENAL ANGIOGRAM;  Surgeon: Wellington Hampshire, MD;  Location: [facility];  Service: Cardiovascular;  Laterality: Left;  . Peripheral vascular catheterization  05/01/2014    Procedure: RENAL INTERVENTION;  Surgeon: Wellington Hampshire, MD; 6 x 15 mm Herculink stent to the Left Renal Artery   Social History   Social History  . Marital Status: Widowed    Spouse Name: N/A  . Number of Children: N/A  . Years of Education: N/A   Social History Main Topics  . Smoking status: Never Smoker   . Smokeless tobacco: Never Used  . Alcohol Use: Yes     Comment: 04/24/2014 "glass of beer or wine q couple weeks"  . Drug Use: No  . Sexual Activity: Not Asked     Comment: Father and husband both smokers   Other Topics Concern  . None   Social History Narrative   Lives in [place]. Daughter nearby.    Review of Systems  Constitutional: Negative for fever, chills, appetite change, fatigue and unexpected weight change.  Eyes: Negative for visual disturbance.  Respiratory: Negative for shortness of breath.   Cardiovascular: Negative for chest pain and leg swelling.  Gastrointestinal: Negative for nausea, vomiting, abdominal pain, diarrhea and constipation.    Musculoskeletal: Negative for myalgias and arthralgias.  Skin: Negative for color change and rash.  Neurological: Negative for weakness.  Hematological: Negative for adenopathy. Does not bruise/bleed easily.  Psychiatric/Behavioral: Negative for sleep disturbance and dysphoric mood. The patient is not nervous/anxious.        Objective:    BP 169/70 mmHg  Pulse 59  Temp(Src) 98 F (36.7 C) (Oral)  Ht '4\' 11"'$  (1.499 m)  Wt 129 lb (58.514 kg)  BMI 26.04 kg/m2  SpO2 100%  LMP  (LMP Unknown) Physical Exam  Constitutional: She is oriented to person, place, and time. She appears well-developed and well-nourished. No distress.  HENT:  Head: Normocephalic and atraumatic.  Right Ear: External ear normal.  Left Ear: External ear normal.  Nose: Nose normal.  Mouth/Throat: Oropharynx is clear and moist. No oropharyngeal exudate.  Eyes: Conjunctivae are normal. Pupils are equal, round, and reactive  to light. Right eye exhibits no discharge. Left eye exhibits no discharge. No scleral icterus.  Neck: Normal range of motion. Neck supple. No tracheal deviation present. No thyromegaly present.  Cardiovascular: Normal rate, regular rhythm, normal heart sounds and intact distal pulses.  Exam reveals no gallop and no friction rub.   No murmur heard. Pulmonary/Chest: Effort normal and breath sounds normal. No accessory muscle usage. No tachypnea. No respiratory distress. She has no decreased breath sounds. She has no wheezes. She has no rales. She exhibits no tenderness. Right breast exhibits no inverted nipple, no mass, no nipple discharge, no skin change and no tenderness. Left breast exhibits no inverted nipple, no mass, no nipple discharge, no skin change and no tenderness. Breasts are symmetrical.  Abdominal: Soft. Bowel sounds are normal. She exhibits no distension and no mass. There is no tenderness. There is no rebound and no guarding.  Musculoskeletal: Normal range of motion. She exhibits no  edema or tenderness.  Lymphadenopathy:    She has no cervical adenopathy.  Neurological: She is alert and oriented to person, place, and time. No cranial nerve deficit. She exhibits normal muscle tone. Coordination normal.  Skin: Skin is warm and dry. No rash noted. She is not diaphoretic. No erythema. No pallor.  Psychiatric: She has a normal mood and affect. Her behavior is normal. Judgment and thought content normal.          Assessment & Plan:  Patient was given a handout regarding current recommendations for health maintenance and preventative care on the AVS.  Problem List Items Addressed This Visit      Unprioritized   Medicare annual wellness visit, subsequent - Primary    Appropriate screening performed. Mammogram UTD 2015, plan repeat in 2017 per pt preference. Labs ordered. Flu vaccine today. Encouraged healthy diet and exercise.      Relevant Orders   CBC with Differential/Platelet   Comprehensive metabolic panel   Lipid panel   Routine general medical examination at a health care facility    General medical exam including breast exam normal today. PAP and pelvic deferred given age and s/p hysterectomy. Mammogram UTD 2015, plan repeat in 2017 per pt preference. Labs ordered. Flu vaccine today. Encouraged healthy diet and exercise.          Return in about 6 months (around 08/19/2015) for Recheck.

## 2015-02-19 NOTE — Progress Notes (Signed)
Pre visit review using our clinic review tool, if applicable. No additional management support is needed unless otherwise documented below in the visit note. 

## 2015-02-19 NOTE — Assessment & Plan Note (Signed)
Appropriate screening performed. Mammogram UTD 2015, plan repeat in 2017 per pt preference. Labs ordered. Flu vaccine today. Encouraged healthy diet and exercise.

## 2015-02-19 NOTE — Patient Instructions (Signed)
Health Maintenance, Female Adopting a healthy lifestyle and getting preventive care can go a long way to promote health and wellness. Talk with your health care provider about what schedule of regular examinations is right for you. This is a good chance for you to check in with your provider about disease prevention and staying healthy. In between checkups, there are plenty of things you can do on your own. Experts have done a lot of research about which lifestyle changes and preventive measures are most likely to keep you healthy. Ask your health care provider for more information. WEIGHT AND DIET  Eat a healthy diet  Be sure to include plenty of vegetables, fruits, low-fat dairy products, and lean protein.  Do not eat a lot of foods high in solid fats, added sugars, or salt.  Get regular exercise. This is one of the most important things you can do for your health.  Most adults should exercise for at least 150 minutes each week. The exercise should increase your heart rate and make you sweat (moderate-intensity exercise).  Most adults should also do strengthening exercises at least twice a week. This is in addition to the moderate-intensity exercise.  Maintain a healthy weight  Body mass index (BMI) is a measurement that can be used to identify possible weight problems. It estimates body fat based on height and weight. Your health care provider can help determine your BMI and help you achieve or maintain a healthy weight.  For females 20 years of age and older:   A BMI below 18.5 is considered underweight.  A BMI of 18.5 to 24.9 is normal.  A BMI of 25 to 29.9 is considered overweight.  A BMI of 30 and above is considered obese.  Watch levels of cholesterol and blood lipids  You should start having your blood tested for lipids and cholesterol at 79 years of age, then have this test every 5 years.  You may need to have your cholesterol levels checked more often if:  Your lipid  or cholesterol levels are high.  You are older than 79 years of age.  You are at high risk for heart disease.  CANCER SCREENING   Lung Cancer  Lung cancer screening is recommended for adults 55-80 years old who are at high risk for lung cancer because of a history of smoking.  A yearly low-dose CT scan of the lungs is recommended for people who:  Currently smoke.  Have quit within the past 15 years.  Have at least a 30-pack-year history of smoking. A pack year is smoking an average of one pack of cigarettes a day for 1 year.  Yearly screening should continue until it has been 15 years since you quit.  Yearly screening should stop if you develop a health problem that would prevent you from having lung cancer treatment.  Breast Cancer  Practice breast self-awareness. This means understanding how your breasts normally appear and feel.  It also means doing regular breast self-exams. Let your health care provider know about any changes, no matter how small.  If you are in your 20s or 30s, you should have a clinical breast exam (CBE) by a health care provider every 1-3 years as part of a regular health exam.  If you are 40 or older, have a CBE every year. Also consider having a breast X-ray (mammogram) every year.  If you have a family history of breast cancer, talk to your health care provider about genetic screening.  If you   are at high risk for breast cancer, talk to your health care provider about having an MRI and a mammogram every year.  Breast cancer gene (BRCA) assessment is recommended for women who have family members with BRCA-related cancers. BRCA-related cancers include:  Breast.  Ovarian.  Tubal.  Peritoneal cancers.  Results of the assessment will determine the need for genetic counseling and BRCA1 and BRCA2 testing. Cervical Cancer Your health care provider may recommend that you be screened regularly for cancer of the pelvic organs (ovaries, uterus, and  vagina). This screening involves a pelvic examination, including checking for microscopic changes to the surface of your cervix (Pap test). You may be encouraged to have this screening done every 3 years, beginning at age 21.  For women ages 30-65, health care providers may recommend pelvic exams and Pap testing every 3 years, or they may recommend the Pap and pelvic exam, combined with testing for human papilloma virus (HPV), every 5 years. Some types of HPV increase your risk of cervical cancer. Testing for HPV may also be done on women of any age with unclear Pap test results.  Other health care providers may not recommend any screening for nonpregnant women who are considered low risk for pelvic cancer and who do not have symptoms. Ask your health care provider if a screening pelvic exam is right for you.  If you have had past treatment for cervical cancer or a condition that could lead to cancer, you need Pap tests and screening for cancer for at least 20 years after your treatment. If Pap tests have been discontinued, your risk factors (such as having a new sexual partner) need to be reassessed to determine if screening should resume. Some women have medical problems that increase the chance of getting cervical cancer. In these cases, your health care provider may recommend more frequent screening and Pap tests. Colorectal Cancer  This type of cancer can be detected and often prevented.  Routine colorectal cancer screening usually begins at 79 years of age and continues through 79 years of age.  Your health care provider may recommend screening at an earlier age if you have risk factors for colon cancer.  Your health care provider may also recommend using home test kits to check for hidden blood in the stool.  A small camera at the end of a tube can be used to examine your colon directly (sigmoidoscopy or colonoscopy). This is done to check for the earliest forms of colorectal  cancer.  Routine screening usually begins at age 50.  Direct examination of the colon should be repeated every 5-10 years through 79 years of age. However, you may need to be screened more often if early forms of precancerous polyps or small growths are found. Skin Cancer  Check your skin from head to toe regularly.  Tell your health care provider about any new moles or changes in moles, especially if there is a change in a mole's shape or color.  Also tell your health care provider if you have a mole that is larger than the size of a pencil eraser.  Always use sunscreen. Apply sunscreen liberally and repeatedly throughout the day.  Protect yourself by wearing long sleeves, pants, a wide-brimmed hat, and sunglasses whenever you are outside. HEART DISEASE, DIABETES, AND HIGH BLOOD PRESSURE   High blood pressure causes heart disease and increases the risk of stroke. High blood pressure is more likely to develop in:  People who have blood pressure in the high end   of the normal range (130-139/85-89 mm Hg).  People who are overweight or obese.  People who are African American.  If you are 38-23 years of age, have your blood pressure checked every 3-5 years. If you are 61 years of age or older, have your blood pressure checked every year. You should have your blood pressure measured twice--once when you are at a hospital or clinic, and once when you are not at a hospital or clinic. Record the average of the two measurements. To check your blood pressure when you are not at a hospital or clinic, you can use:  An automated blood pressure machine at a pharmacy.  A home blood pressure monitor.  If you are between 45 years and 39 years old, ask your health care provider if you should take aspirin to prevent strokes.  Have regular diabetes screenings. This involves taking a blood sample to check your fasting blood sugar level.  If you are at a normal weight and have a low risk for diabetes,  have this test once every three years after 79 years of age.  If you are overweight and have a high risk for diabetes, consider being tested at a younger age or more often. PREVENTING INFECTION  Hepatitis B  If you have a higher risk for hepatitis B, you should be screened for this virus. You are considered at high risk for hepatitis B if:  You were born in a country where hepatitis B is common. Ask your health care provider which countries are considered high risk.  Your parents were born in a high-risk country, and you have not been immunized against hepatitis B (hepatitis B vaccine).  You have HIV or AIDS.  You use needles to inject street drugs.  You live with someone who has hepatitis B.  You have had sex with someone who has hepatitis B.  You get hemodialysis treatment.  You take certain medicines for conditions, including cancer, organ transplantation, and autoimmune conditions. Hepatitis C  Blood testing is recommended for:  Everyone born from [birth year] through 1963.  Anyone with known risk factors for hepatitis C. Sexually transmitted infections (STIs)  You should be screened for sexually transmitted infections (STIs) including gonorrhea and chlamydia if:  You are sexually active and are younger than 79 years of age.  You are older than 79 years of age and your health care provider tells you that you are at risk for this type of infection.  Your sexual activity has changed since you were last screened and you are at an increased risk for chlamydia or gonorrhea. Ask your health care provider if you are at risk.  If you do not have HIV, but are at risk, it may be recommended that you take a prescription medicine daily to prevent HIV infection. This is called pre-exposure prophylaxis (PrEP). You are considered at risk if:  You are sexually active and do not regularly use condoms or know the HIV status of your partner(s).  You take drugs by injection.  You are sexually  active with a partner who has HIV. Talk with your health care provider about whether you are at high risk of being infected with HIV. If you choose to begin PrEP, you should first be tested for HIV. You should then be tested every 3 months for as long as you are taking PrEP.  PREGNANCY   If you are premenopausal and you may become pregnant, ask your health care provider about preconception counseling.  If you may  become pregnant, take 400 to 800 micrograms (mcg) of folic acid every day.  If you want to prevent pregnancy, talk to your health care provider about birth control (contraception). OSTEOPOROSIS AND MENOPAUSE   Osteoporosis is a disease in which the bones lose minerals and strength with aging. This can result in serious bone fractures. Your risk for osteoporosis can be identified using a bone density scan.  If you are 61 years of age or older, or if you are at risk for osteoporosis and fractures, ask your health care provider if you should be screened.  Ask your health care provider whether you should take a calcium or vitamin D supplement to lower your risk for osteoporosis.  Menopause may have certain physical symptoms and risks.  Hormone replacement therapy may reduce some of these symptoms and risks. Talk to your health care provider about whether hormone replacement therapy is right for you.  HOME CARE INSTRUCTIONS   Schedule regular health, dental, and eye exams.  Stay current with your immunizations.   Do not use any tobacco products including cigarettes, chewing tobacco, or electronic cigarettes.  If you are pregnant, do not drink alcohol.  If you are breastfeeding, limit how much and how often you drink alcohol.  Limit alcohol intake to no more than 1 drink per day for nonpregnant women. One drink equals 12 ounces of beer, 5 ounces of wine, or 1 ounces of hard liquor.  Do not use street drugs.  Do not share needles.  Ask your health care provider for help if  you need support or information about quitting drugs.  Tell your health care provider if you often feel depressed.  Tell your health care provider if you have ever been abused or do not feel safe at home.   This information is not intended to replace advice given to you by your health care provider. Make sure you discuss any questions you have with your health care provider.   Document Released: 10/05/2010 Document Revised: 04/12/2014 Document Reviewed: 02/21/2013 Elsevier Interactive Patient Education [employer].

## 2015-02-19 NOTE — Assessment & Plan Note (Signed)
General medical exam including breast exam normal today. PAP and pelvic deferred given age and s/p hysterectomy. Mammogram UTD 2015, plan repeat in 2017 per pt preference. Labs ordered. Flu vaccine today. Encouraged healthy diet and exercise.

## 2015-06-04 ENCOUNTER — Encounter: Payer: Self-pay | Admitting: Internal Medicine

## 2015-06-06 ENCOUNTER — Telehealth: Payer: Self-pay | Admitting: Internal Medicine

## 2015-06-06 NOTE — Telephone Encounter (Signed)
Pt called about receiving a message stating to check for test results. Pt states she did not have any recent test done. Pt said it came from [facility]. I did explain to pt that [facility] is large but she said she did not go any where else.  Call pt @ [phone] or cell [phone]. Thank you!

## 2015-06-06 NOTE — Telephone Encounter (Signed)
I explained to Tamara Burton to disregard the phone call about test results, pt has not had any recent orders for anything but if she has any other questions or concerns to give the office a call.

## 2015-06-10 ENCOUNTER — Telehealth: Payer: Self-pay | Admitting: Cardiovascular Disease

## 2015-06-10 ENCOUNTER — Other Ambulatory Visit: Payer: Self-pay | Admitting: *Deleted

## 2015-06-10 MED ORDER — PANTOPRAZOLE SODIUM 40 MG PO TBEC: 40.0000 mg | DELAYED_RELEASE_TABLET | Freq: Every day | ORAL | Status: DC

## 2015-06-10 MED ORDER — CARVEDILOL 6.25 MG PO TABS: 6.2500 mg | ORAL_TABLET | Freq: Two times a day (BID) | ORAL | Status: DC

## 2015-06-10 MED ORDER — LOSARTAN POTASSIUM-HCTZ 100-25 MG PO TABS: 1.0000 | ORAL_TABLET | Freq: Every day | ORAL | Status: DC

## 2015-06-10 NOTE — Telephone Encounter (Signed)
°*  STAT* If patient is at the pharmacy, call can be transferred to refill team.   1. Which medications need to be refilled? (please list name of each medication and dose if known)  1. Carvedilol  2. Pantoprazole  3. Losartan   2. Which pharmacy/location (including street and city if local pharmacy) is medication to be sent to? [facility] mail order   3. Do they need a 30 day or 90 day supply? 90 day

## 2015-06-10 NOTE — Telephone Encounter (Signed)
Requested Prescriptions   Signed Prescriptions Disp Refills  . carvedilol (COREG) 6.25 MG tablet 180 tablet 3    Sig: Take 1 tablet (6.25 mg total) by mouth 2 (two) times daily with a meal.    Authorizing Provider: Kathlyn Sacramento A    Ordering User: LOPEZ, MARINA C  . pantoprazole (PROTONIX) 40 MG tablet 90 tablet 3    Sig: Take 1 tablet (40 mg total) by mouth daily.    Authorizing Provider: Kathlyn Sacramento A    Ordering User: Britt Bottom losartan-hydrochlorothiazide (HYZAAR) 100-25 MG tablet 90 tablet 3    Sig: Take 1 tablet by mouth daily.    Authorizing Provider: Kathlyn Sacramento A    Ordering User: Britt Bottom

## 2015-06-25 ENCOUNTER — Encounter: Payer: Self-pay | Admitting: Internal Medicine

## 2015-07-10 ENCOUNTER — Encounter: Payer: Self-pay | Admitting: Family Medicine

## 2015-07-10 ENCOUNTER — Ambulatory Visit (INDEPENDENT_AMBULATORY_CARE_PROVIDER_SITE_OTHER): Payer: Medicare PPO | Admitting: Family Medicine

## 2015-07-10 ENCOUNTER — Ambulatory Visit
Admission: RE | Admit: 2015-07-10 | Discharge: 2015-07-10 | Disposition: A | Discharge status: Home / Self Care | Payer: Medicare PPO | Source: Ambulatory Visit | Attending: Family Medicine | Admitting: Family Medicine

## 2015-07-10 DIAGNOSIS — R05 Cough: Secondary | ICD-10-CM

## 2015-07-10 DIAGNOSIS — J209 Acute bronchitis, unspecified: Secondary | ICD-10-CM | POA: Insufficient documentation

## 2015-07-10 DIAGNOSIS — R059 Cough, unspecified: Secondary | ICD-10-CM

## 2015-07-10 MED ORDER — PREDNISONE 50 MG PO TABS: ORAL_TABLET | ORAL | Status: DC

## 2015-07-10 MED ORDER — ALBUTEROL SULFATE HFA 108 (90 BASE) MCG/ACT IN AERS: 2.0000 | INHALATION_SPRAY | Freq: Four times a day (QID) | RESPIRATORY_TRACT | Status: DC | PRN

## 2015-07-10 NOTE — Assessment & Plan Note (Signed)
New acute problem. Treating with prednisone and PRN albuterol. Patient declined cough medication. Given history of lung cancer and duration of symptoms, obtaining chest xray as well.

## 2015-07-10 NOTE — Patient Instructions (Signed)
Take the prednisone as prescribed.  Use the albuterol as needed.  We will call with the xray results.  Call if you worsen or fail to improve.  Take care  Dr. Lacinda Axon

## 2015-07-10 NOTE — Progress Notes (Signed)
Subjective:  Patient ID: Tamara Burton, female    DOB: June 21, 1933  Age: 82 y.o. MRN: [id number]  CC: Cough  HPI:  82 year old female with a history of lung cancer s/p lobectomy presents with complaints of cough.  Patient reports that she has chronic cough.  For the past 2-3 weeks, she has had a worsening of her cough. Cough is severe. Mildly productive. Mild associated SOB. She also reports associated chest discomfort from the frequency of the cough and wheezing. She has been using Delsym for the cough with mild improvement. No known exacerbating factors. No fever, chills.   Social Hx   Social History   Social History  . Marital Status: Widowed    Spouse Name: N/A  . Number of Children: N/A  . Years of Education: N/A   Social History Main Topics  . Smoking status: Never Smoker   . Smokeless tobacco: Never Used  . Alcohol Use: Yes     Comment: 04/24/2014 "glass of beer or wine q couple weeks"  . Drug Use: No  . Sexual Activity: Not Asked     Comment: Father and husband both smokers   Other Topics Concern  . None   Social History Narrative   Lives in [place]. Daughter nearby.   Review of Systems  Constitutional: Negative for fever.  Respiratory: Positive for cough and wheezing.    Objective:  BP 132/70 mmHg  Pulse 75  Temp(Src) 98.1 F (36.7 C) (Oral)  Ht '4\' 11"'$  (1.499 m)  Wt 128 lb 8 oz (58.287 kg)  BMI 25.94 kg/m2  SpO2 94%  LMP  (LMP Unknown)  BP/Weight 07/10/2015 02/19/2015 50/93/2671  Systolic BP 245 809 983  Diastolic BP 70 70 60  Wt. (Lbs) 128.5 129 128  BMI 25.94 26.04 24.2   Physical Exam  Constitutional:  Elderly female in NAD.   HENT:  Mouth/Throat: Oropharynx is clear and moist.  Eyes: Conjunctivae are normal. No scleral icterus.  Neck: Neck supple.  Cardiovascular: Normal rate and regular rhythm.   Pulmonary/Chest: Effort normal. She has no wheezes.  Neurological: She is alert.  Vitals reviewed.  Lab Results  Component Value Date   WBC 6.7 02/19/2015   HGB 11.8* 02/19/2015   HCT 36.1 02/19/2015   PLT 245.0 02/19/2015   GLUCOSE 85 02/19/2015   CHOL 186 02/19/2015   TRIG 76.0 02/19/2015   HDL 68.90 02/19/2015   LDLDIRECT 201.7 02/05/2013   LDLCALC 101* 02/19/2015   ALT 16 02/19/2015   AST 23 02/19/2015   NA 139 02/19/2015   K 4.3 02/19/2015   CL 100 02/19/2015   CREATININE 0.81 02/19/2015   BUN 16 02/19/2015   CO2 33* 02/19/2015   TSH 1.11 04/03/2014   INR 0.95 05/01/2014   HGBA1C 5.6 04/03/2014    Assessment & Plan:   Problem List Items Addressed This Visit    Acute bronchitis    New acute problem. Treating with prednisone and PRN albuterol. Patient declined cough medication. Given history of lung cancer and duration of symptoms, obtaining chest xray as well.       Relevant Orders   DG Chest 2 View      Meds ordered this encounter  Medications  . predniSONE (DELTASONE) 50 MG tablet    Sig: 1 tablet daily x 5 days.    Dispense:  5 tablet    Refill:  0  . albuterol (PROVENTIL HFA;VENTOLIN HFA) 108 (90 Base) MCG/ACT inhaler    Sig: Inhale 2 puffs into the  lungs every 6 (six) hours as needed for wheezing or shortness of breath.    Dispense:  1 Inhaler    Refill:  0    Follow-up: PRN  [place]

## 2015-07-10 NOTE — Progress Notes (Signed)
Pre visit review using our clinic review tool, if applicable. No additional management support is needed unless otherwise documented below in the visit note. 

## 2015-07-17 ENCOUNTER — Encounter: Payer: Self-pay | Admitting: Family Medicine

## 2015-08-22 ENCOUNTER — Encounter: Payer: Self-pay | Admitting: Cardiovascular Disease

## 2015-08-22 ENCOUNTER — Ambulatory Visit (INDEPENDENT_AMBULATORY_CARE_PROVIDER_SITE_OTHER): Payer: Medicare PPO | Admitting: Cardiovascular Disease

## 2015-08-22 VITALS — BP 122/62 | HR 65 | Ht 60.0 in | Wt 125.8 lb

## 2015-08-22 DIAGNOSIS — I10 Essential (primary) hypertension: Secondary | ICD-10-CM

## 2015-08-22 DIAGNOSIS — I701 Atherosclerosis of renal artery: Secondary | ICD-10-CM | POA: Diagnosis not present

## 2015-08-22 DIAGNOSIS — I6529 Occlusion and stenosis of unspecified carotid artery: Secondary | ICD-10-CM

## 2015-08-22 NOTE — Patient Instructions (Signed)
Medication Instructions: Continue same medications.   Labwork: None.   Procedures/Testing: None.   Follow-Up: 6 months with Dr. Ragan Duhon.   Any Additional Special Instructions Will Be Listed Below (If Applicable).     If you need a refill on your cardiac medications before your next appointment, please call your pharmacy.   

## 2015-08-22 NOTE — Progress Notes (Signed)
Cardiology Office Note   Date:  08/22/2015   ID:  Tamara Burton, DOB 15-Jan-1934, MRN [id number]  PCP:  Rica Mast, MD  Cardiologist:   Kathlyn Sacramento, MD   Chief Complaint  Patient presents with  . other    6 month follow up. Meds reviewed by the patient verbally. "doing well."       History of Present Illness: Tamara Burton is a 82 y.o. female who presents for a follow-up visit regarding hypertension and renal artery stenosis status post left renal artery stenting in 04/2014 . Cardiac cath was done in January of 2016 due to chest pain and shortness of breath showed no significant coronary artery disease with normal left ventricular end-diastolic pressure.   she has been doing very well with no recurrent chest pain or shortness of breath. Blood pressure has been mostly well controlled except for occasional high readings that usually respond to as needed hydralazine.  renal artery duplex this month showed patent left renal artery stent. She has been doing very well with no chest pain or shortness of breath. I reviewed her blood pressure readings at home and overall they look excellent. She did have one episode of elevated blood pressure above 200 last weekend when she was visiting her family. The blood pressure improved with an additional dose of hydralazine.    Past Medical History  Diagnosis Date  . Hyperlipidemia   . Orthostatic hypotension   . Carotid artery disease ([facility])   . PONV (postoperative nausea and vomiting)     "when I was younger"  . Hypertension   . OSA on CPAP     "wear it intermittently"  . Sinus headache   . Headache     "lately; related to HTN" (04/24/2014)  . Arthritis     "qwhere"  . Chronic lower back pain   . Lung cancer ([facility]) 2003    s/p left lower lobe resection  . Non-obstructive CAD     a. 04/2014 Cath: LM nl, LAD/D1/LCX/RCA min irregs, RPDA/PAV nl.  . Renal artery stenosis ([facility])     a. 04/2014 Renal angiography: LRA 95p, RRA  40ost. PTA of LRA deferred 2/2 guide cath induced Ao dissection;  01/27 6 x 15 mm Herculink stent to the L-RA     Past Surgical History  Procedure Laterality Date  . Appendectomy    . Total hip arthroplasty Right   . Replacement total knee Left   . Bilateral oophorectomy Bilateral 2000's  . Vaginal delivery  X 2  . Cataract extraction w/ intraocular lens  implant, bilateral Bilateral   . Cardiac catheterization  04/24/2014  . Renal angiogram  04/24/2014  . Joint replacement    . Vaginal hysterectomy  1980's  . Lung lobectomy Left 2003    "lower lobe"  . Tubal ligation  ~ 1975  . Left heart catheterization with coronary angiogram N/A 04/24/2014    Procedure: LEFT HEART CATHETERIZATION WITH CORONARY ANGIOGRAM;  Surgeon: Wellington Hampshire, MD;  Location: [facility] CATH LAB;  Service: Cardiovascular;  Laterality: N/A;  . Renal angiogram N/A 04/24/2014    Procedure: RENAL ANGIOGRAM;  Surgeon: Wellington Hampshire, MD;  Location: [facility] CATH [facility];  Service: Cardiovascular;  Laterality: N/A;  . Renal angiogram Left 05/01/2014    Procedure: RENAL ANGIOGRAM;  Surgeon: Wellington Hampshire, MD;  Location: [facility] CATH LAB;  Service: Cardiovascular;  Laterality: Left;  . Peripheral vascular catheterization  05/01/2014    Procedure: RENAL INTERVENTION;  Surgeon: Wellington Hampshire, MD;  6 x 15 mm Herculink stent to the Left Renal Artery     Current Outpatient Prescriptions  Medication Sig Dispense Refill  . albuterol (PROVENTIL HFA;VENTOLIN HFA) 108 (90 Base) MCG/ACT inhaler Inhale 2 puffs into the lungs every 6 (six) hours as needed for wheezing or shortness of breath. 1 Inhaler 0  . Ascorbic Acid (VITAMIN C CR) 1500 MG TBCR Take by mouth daily.    Marland Kitchen aspirin 81 MG tablet Take 81 mg by mouth daily.    . B Complex Vitamins (VITAMIN B COMPLEX PO) Take by mouth.    . Calcium-Vitamin D 600-200 MG-UNIT per tablet Take by mouth.    . carvedilol (COREG) 6.25 MG tablet Take 1 tablet (6.25 mg total) by mouth 2 (two) times daily with a  meal. 180 tablet 3  . Cholecalciferol 2000 UNITS TABS Take 1 tablet by mouth daily.    . ciclopirox (PENLAC) 8 % solution Apply topically at bedtime. Apply over nail and surrounding skin. Apply daily over previous coat. After seven (7) days, may remove with alcohol and continue cycle. 6.6 mL 4  . fexofenadine (ALLEGRA) 180 MG tablet Take by mouth. Reported on 07/10/2015    . Fish Oil OIL by Does not apply route.    . fluticasone (FLONASE) 50 MCG/ACT nasal spray Place 1 spray into both nostrils daily as needed. 16 g 2  . hydrALAZINE (APRESOLINE) 25 MG tablet Take 1 tablet (25 mg total) by mouth 3 (three) times daily. (Patient taking differently: Take 25 mg by mouth as needed. ) 90 tablet 6  . ibuprofen (ADVIL,MOTRIN) 200 MG tablet Take by mouth.    . losartan-hydrochlorothiazide (HYZAAR) 100-25 MG tablet Take 1 tablet by mouth daily. 90 tablet 3  . Lysine HCl 500 MG CAPS Take 1,000 mg by mouth daily as needed (for cold sores).     . niacin (NIASPAN) 1000 MG CR tablet Take 1,000 mg by mouth at bedtime.    . Omega-3 Fatty Acids (FISH OIL) 1200 MG CAPS Take by mouth.    . pantoprazole (PROTONIX) 40 MG tablet Take 1 tablet (40 mg total) by mouth daily. 90 tablet 3  . traZODone (DESYREL) 50 MG tablet Take 0.5-1 tablets (25-50 mg total) by mouth at bedtime as needed for sleep. 90 tablet 0  . Zinc 50 MG TABS Take by mouth daily.     No current facility-administered medications for this visit.    Allergies:   Atorvastatin; Penicillins; Sulfa antibiotics; and Amlodipine    Social History:  The patient  reports that she has never smoked. She has never used smokeless tobacco. She reports that she drinks alcohol. She reports that she does not use illicit drugs.   Family History:  The patient's family history includes Cancer in her paternal grandmother; Parkinsonism in her mother; Sarcoidosis in her brother; Stroke in her maternal grandmother.    ROS:  Please see the history of present illness.    Otherwise, review of systems are positive for none.   All other systems are reviewed and negative.    PHYSICAL EXAM: VS:  BP 140/70 mmHg  Pulse 63  Ht 5' (1.524 m)  Wt 125 lb 12 oz (57.04 kg)  BMI 24.56 kg/m2  LMP  (LMP Unknown) , BMI Body mass index is 24.56 kg/(m^2). GEN: Well nourished, well developed, in no acute distress HEENT: normal Neck: no JVD, carotid bruits, or masses Cardiac: RRR; no murmurs, rubs, or gallops,no edema  Respiratory:  clear to auscultation bilaterally, normal work of  breathing GI: soft, nontender, nondistended, + BS MS: no deformity or atrophy Skin: warm and dry, no rash Neuro:  Strength and sensation are intact Psych: euthymic mood, full affect   EKG:  EKG is ordered today. The ekg ordered today demonstrates normal sinus rhythm with no significant ST or T wave changes.   Recent Labs: 02/19/2015: ALT 16; BUN 16; Creatinine, Ser 0.81; Hemoglobin 11.8*; Platelets 245.0; Potassium 4.3; Sodium 139    Lipid Panel    Component Value Date/Time   CHOL 186 02/19/2015 1438   CHOL 177 04/03/2014 0400   TRIG 76.0 02/19/2015 1438   TRIG 138 04/03/2014 0400   HDL 68.90 02/19/2015 1438   HDL 62* 04/03/2014 0400   CHOLHDL 3 02/19/2015 1438   VLDL 15.2 02/19/2015 1438   VLDL 28 04/03/2014 0400   LDLCALC 101* 02/19/2015 1438   LDLCALC 87 04/03/2014 0400   LDLDIRECT 201.7 02/05/2013 1050      Wt Readings from Last 3 Encounters:  08/22/15 125 lb 12 oz (57.04 kg)  07/10/15 128 lb 8 oz (58.287 kg)  02/19/15 129 lb (58.514 kg)        ASSESSMENT AND PLAN:  1.   Status post Left renal artery stent placement for renovascular hypertension:She is doing well overall and blood pressure is reasonably controlled. I made no changes in her medications. Repeat renal artery duplex later this year.  2. Hyperlipidemia: She discontinued small dose rosuvastatin due to myalgia and currently she is on small dose niacin and fish oil. She appears to be intolerant to all  statins.   Disposition:   FU with me in 6 months  Signed,  Kathlyn Sacramento, MD  08/22/2015 9:26 AM    [place]

## 2015-09-18 ENCOUNTER — Encounter: Payer: Self-pay | Admitting: Family Medicine

## 2015-09-18 ENCOUNTER — Ambulatory Visit (INDEPENDENT_AMBULATORY_CARE_PROVIDER_SITE_OTHER): Payer: Medicare PPO | Admitting: Family Medicine

## 2015-09-18 VITALS — BP 134/76 | HR 67 | Temp 98.1°F | Ht 60.0 in | Wt 125.5 lb

## 2015-09-18 DIAGNOSIS — R5383 Other fatigue: Secondary | ICD-10-CM | POA: Diagnosis not present

## 2015-09-18 DIAGNOSIS — R1013 Epigastric pain: Secondary | ICD-10-CM

## 2015-09-18 LAB — COMPREHENSIVE METABOLIC PANEL
ALT: 15 U/L (ref 0–35)
AST: 22 U/L (ref 0–37)
Albumin: 4.1 g/dL (ref 3.5–5.2)
Alkaline Phosphatase: 96 U/L (ref 39–117)
BUN: 15 mg/dL (ref 6–23)
CHLORIDE: 98 meq/L (ref 96–112)
CO2: 33 meq/L — AB (ref 19–32)
Calcium: 9.6 mg/dL (ref 8.4–10.5)
Creatinine, Ser: 0.83 mg/dL (ref 0.40–1.20)
GFR: 70.28 mL/min (ref >60.00)
GLUCOSE: 108 mg/dL — AB (ref 70–99)
POTASSIUM: 4.9 meq/L (ref 3.5–5.1)
Sodium: 135 mEq/L (ref 135–145)
Total Bilirubin: 0.4 mg/dL (ref 0.2–1.2)
Total Protein: 6.6 g/dL (ref 6.0–8.3)

## 2015-09-18 LAB — CBC WITH DIFFERENTIAL/PLATELET
BASOS ABS: 0 10*3/uL (ref 0.0–0.1)
Basophils Relative: 0.5 % (ref 0.0–3.0)
EOS PCT: 1.6 % (ref 0.0–5.0)
Eosinophils Absolute: 0.1 10*3/uL (ref 0.0–0.7)
HCT: 35.4 % — ABNORMAL LOW (ref 36.0–46.0)
Hemoglobin: 11.8 g/dL — ABNORMAL LOW (ref 12.0–15.0)
LYMPHS ABS: 1.4 10*3/uL (ref 0.7–4.0)
Lymphocytes Relative: 27.2 % (ref 12.0–46.0)
MCHC: 33.3 g/dL (ref 30.0–36.0)
MCV: 86.8 fl (ref 78.0–100.0)
MONOS PCT: 8.9 % (ref 3.0–12.0)
Monocytes Absolute: 0.5 10*3/uL (ref 0.1–1.0)
NEUTROS ABS: 3.2 10*3/uL (ref 1.4–7.7)
NEUTROS PCT: 61.8 % (ref 43.0–77.0)
PLATELETS: 237 10*3/uL (ref 150.0–400.0)
RBC: 4.08 Mil/uL (ref 3.87–5.11)
RDW: 13.5 % (ref 11.5–15.5)
WBC: 5.1 10*3/uL (ref 4.0–10.5)

## 2015-09-18 LAB — TSH: TSH: 0.51 u[IU]/mL (ref 0.35–4.50)

## 2015-09-18 LAB — LIPASE: Lipase: 40 U/L (ref 11.0–59.0)

## 2015-09-18 MED ORDER — DOXYCYCLINE HYCLATE 100 MG PO TABS: 100.0000 mg | ORAL_TABLET | Freq: Two times a day (BID) | ORAL | Status: DC

## 2015-09-18 NOTE — Patient Instructions (Signed)
Take the doxycycline twice a day with food. Be sure to wear sunscreen as it will make you photosensitive.  We will call with your lab results.  We will arrange follow up when your labs return.  Take care  Dr. Lacinda Axon

## 2015-09-18 NOTE — Progress Notes (Signed)
Subjective:  Patient ID: Tamara Burton, female    DOB: 01-26-1934  Age: 82 y.o. MRN: [id number]  CC: Fatigue, Nausea, No appetite.   HPI:  82 year old female with a past medical history of renovascular hypertension,lung cancer in remission, sleep apnea presents with the above complaints.  Patient states that for the past few weeks she has not been feeling well. She states that she's been expiencing  signifcant fatigue and decreased appetite. She's had some mild nausea. She also reports some vague abdominal pain. She states that she was bitten by several ticks recently and is concerned that this may be the cause of her symptoms. No known exacerbating or relieving factors. Patient is not compliant with her CPAP. No new changes in her medications. No other reported changes. No recent fevers, chills. She endorses mild shortness of breath but states that she is able to walk her dog as she normally does. No other complaints at this time.  Social Hx   Social History   Social History  . Marital Status: Widowed    Spouse Name: N/A  . Number of Children: N/A  . Years of Education: N/A   Social History Main Topics  . Smoking status: Never Smoker   . Smokeless tobacco: Never Used  . Alcohol Use: Yes     Comment: 04/24/2014 "glass of beer or wine q couple weeks"  . Drug Use: No  . Sexual Activity: Not Asked     Comment: Father and husband both smokers   Other Topics Concern  . None   Social History Narrative   Lives in [place]. Daughter nearby.   Review of Systems  Constitutional: Positive for appetite change and fatigue. Negative for fever.  Gastrointestinal: Positive for nausea and abdominal pain.   Objective:  BP 134/76 mmHg  Pulse 67  Temp(Src) 98.1 F (36.7 C) (Oral)  Ht 5' (1.524 m)  Wt 125 lb 8 oz (56.926 kg)  BMI 24.51 kg/m2  SpO2 96%  LMP  (LMP Unknown)  BP/Weight 09/18/2015 4/85/4627 0/06/5007  Systolic BP 381 829 937  Diastolic BP 76 62 70  Wt. (Lbs) 125.5 125.75  128.5  BMI 24.51 24.56 25.94   Physical Exam  Constitutional: She is oriented to person, place, and time. She appears well-developed. No distress.  Cardiovascular: Normal rate and regular rhythm.   2/6 systolic murmur.  Pulmonary/Chest: Effort normal. She has no wheezes. She has no rales.  Abdominal: Soft. She exhibits no distension.  Mild epigastric tenderness to palpation. No rebound or guarding.  Neurological: She is alert and oriented to person, place, and time.  Skin:  Epigastric region of the abdomen with and erythematous rash. ? Erythema migrans.  Psychiatric:  Flat affect.  Vitals reviewed.  Lab Results  Component Value Date   WBC 6.7 02/19/2015   HGB 11.8* 02/19/2015   HCT 36.1 02/19/2015   PLT 245.0 02/19/2015   GLUCOSE 85 02/19/2015   CHOL 186 02/19/2015   TRIG 76.0 02/19/2015   HDL 68.90 02/19/2015   LDLDIRECT 201.7 02/05/2013   LDLCALC 101* 02/19/2015   ALT 16 02/19/2015   AST 23 02/19/2015   NA 139 02/19/2015   K 4.3 02/19/2015   CL 100 02/19/2015   CREATININE 0.81 02/19/2015   BUN 16 02/19/2015   CO2 33* 02/19/2015   TSH 1.11 04/03/2014   INR 0.95 05/01/2014   HGBA1C 5.6 04/03/2014   Assessment & Plan:   Problem List Items Addressed This Visit    Abdominal pain, epigastric  New problem. Patient with decreased appetite and mild epigastric tenderness. Patient also with significant fatigue. Obtaining laboratory studies today. Holding off on imaging while awaiting lab studies.      Relevant Orders   Lipase   Other fatigue - Primary    New problem. Unclear etiology/prognosis of this time. Differential is broad. Obtaining laboratory workup today. See orders. Given symptoms and recent tick bites, I discussed empiric treatment with doxycycline. Patient is in agreement. Will treat.      Relevant Orders   CBC w/Diff   Comp Met (CMET)   TSH     Meds ordered this encounter  Medications  . doxycycline (VIBRA-TABS) 100 MG tablet    Sig: Take 1  tablet (100 mg total) by mouth 2 (two) times daily.    Dispense:  20 tablet    Refill:  0    Follow-up: TBD based on labs  [place]

## 2015-09-18 NOTE — Assessment & Plan Note (Signed)
New problem. Patient with decreased appetite and mild epigastric tenderness. Patient also with significant fatigue. Obtaining laboratory studies today. Holding off on imaging while awaiting lab studies.

## 2015-09-18 NOTE — Progress Notes (Signed)
Pre visit review using our clinic review tool, if applicable. No additional management support is needed unless otherwise documented below in the visit note. 

## 2015-09-18 NOTE — Assessment & Plan Note (Signed)
New problem. Unclear etiology/prognosis of this time. Differential is broad. Obtaining laboratory workup today. See orders. Given symptoms and recent tick bites, I discussed empiric treatment with doxycycline. Patient is in agreement. Will treat.

## 2015-10-20 ENCOUNTER — Ambulatory Visit: Payer: Medicare PPO | Admitting: Cardiovascular Disease

## 2015-10-20 ENCOUNTER — Encounter: Payer: Self-pay | Admitting: Cardiovascular Disease

## 2015-10-20 ENCOUNTER — Telehealth: Payer: Self-pay | Admitting: Cardiovascular Disease

## 2015-10-20 ENCOUNTER — Ambulatory Visit (INDEPENDENT_AMBULATORY_CARE_PROVIDER_SITE_OTHER): Payer: Medicare PPO | Admitting: Cardiovascular Disease

## 2015-10-20 VITALS — BP 160/64 | HR 83 | Ht 61.0 in | Wt 127.0 lb

## 2015-10-20 DIAGNOSIS — I10 Essential (primary) hypertension: Secondary | ICD-10-CM | POA: Diagnosis not present

## 2015-10-20 DIAGNOSIS — I701 Atherosclerosis of renal artery: Secondary | ICD-10-CM | POA: Diagnosis not present

## 2015-10-20 DIAGNOSIS — E785 Hyperlipidemia, unspecified: Secondary | ICD-10-CM

## 2015-10-20 MED ORDER — CARVEDILOL 12.5 MG PO TABS: 12.5000 mg | ORAL_TABLET | Freq: Two times a day (BID) | ORAL | Status: DC

## 2015-10-20 NOTE — Patient Instructions (Signed)
Medication Instructions:  Your physician has recommended you make the following change in your medication:  INCREASE coreg to 12.'5mg'$  twice daily   Labwork: none  Testing/Procedures: none  Follow-Up: Your physician recommends that you schedule a follow-up appointment in: 2 months with Dr. Fletcher Anon.    Any Other Special Instructions Will Be Listed Below (If Applicable).     If you need a refill on your cardiac medications before your next appointment, please call your pharmacy.

## 2015-10-20 NOTE — Telephone Encounter (Signed)
Pt is coming in today to see Dr Fletcher Anon (she will try and find a ride) but would like to give [place] the details just in case she can't make it  Pt c/o BP issue: STAT if pt c/o blurred vision, one-sided weakness or slurred speech  1. What are your last 5 BP readings?  10/16/15  724 am 142/68 HR 64 415 PM 228/112 HR 83 Took a 50 mg hydralazine 214/113 this was in the evening HR 81 Took another Hydralazine  50 mg and trazodone 25 mg 115 am went to bed  10/17/15  6:30 am110/54 HR 69  10/18/15  745 am 122/58 hr 58 10/19/15  713 am 148/67 hr 57 1215 pm 195/107 hr 76  . Took a 50 mg Hydralazine  840 PM 148/75 HR 75 10/20/15 630 am 172/79 HR 64 11 am 176/91 hr 69   2. Are you having any other symptoms (ex. Dizziness, headache, blurred vision, passed out)? Headache, very tired.   3. What is your BP issue? BP running a bit high

## 2015-10-20 NOTE — Progress Notes (Signed)
Cardiology Office Note   Date:  10/20/2015   ID:  Tamara Burton, DOB 07-21-1933, MRN [id number]  PCP:  Coral Spikes, DO  Cardiologist:   Kathlyn Sacramento, MD   Chief Complaint  Patient presents with  . Hypertension    pt stated that she is having elevated BP      History of Present Illness: Tamara Burton is a 82 y.o. female who presents for a follow-up visit regarding hypertension and renal artery stenosis status post left renal artery stenting in 04/2014 . Cardiac cath was done in January of 2016 due to chest pain and shortness of breath showed no significant coronary artery disease with normal left ventricular end-diastolic pressure.  She has known history of labile hypertension which typically correlates with anxiety. She called [place] today because her blood pressure has been running high frequently. She had readings above 200. Usually she takes hydralazine as needed if systolic blood pressure exceeds 116 mmHg. She denies increased stress. No chest pain. When her blood pressure is high, she feels very drained with no energy. She also complains of some abdominal bloating.   Past Medical History  Diagnosis Date  . Hyperlipidemia   . Orthostatic hypotension   . Carotid artery disease ([place])   . PONV (postoperative nausea and vomiting)     "when I was younger"  . Hypertension   . OSA on CPAP     "wear it intermittently"  . Sinus headache   . Headache     "lately; related to HTN" (04/24/2014)  . Arthritis     "qwhere"  . Chronic lower back pain   . Lung cancer ([place]) 2003    s/p left lower lobe resection  . Non-obstructive CAD     a. 04/2014 Cath: LM nl, LAD/D1/LCX/RCA min irregs, RPDA/PAV nl.  . Renal artery stenosis ([place])     a. 04/2014 Renal angiography: LRA 95p, RRA 40ost. PTA of LRA deferred 2/2 guide cath induced Ao dissection;  01/27 6 x 15 mm Herculink stent to the L-RA     Past Surgical History  Procedure Laterality Date  . Appendectomy    . Total hip arthroplasty  Right   . Replacement total knee Left   . Bilateral oophorectomy Bilateral 2000's  . Vaginal delivery  X 2  . Cataract extraction w/ intraocular lens  implant, bilateral Bilateral   . Cardiac catheterization  04/24/2014  . Renal angiogram  04/24/2014  . Joint replacement    . Vaginal hysterectomy  1980's  . Lung lobectomy Left 2003    "lower lobe"  . Tubal ligation  ~ 1975  . Left heart catheterization with coronary angiogram N/A 04/24/2014    Procedure: LEFT HEART CATHETERIZATION WITH CORONARY ANGIOGRAM;  Surgeon: Wellington Hampshire, MD;  Location: [place] CATH LAB;  Service: Cardiovascular;  Laterality: N/A;  . Renal angiogram N/A 04/24/2014    Procedure: RENAL ANGIOGRAM;  Surgeon: Wellington Hampshire, MD;  Location: [place] CATH LAB;  Service: Cardiovascular;  Laterality: N/A;  . Renal angiogram Left 05/01/2014    Procedure: RENAL ANGIOGRAM;  Surgeon: Wellington Hampshire, MD;  Location: [place] CATH LAB;  Service: Cardiovascular;  Laterality: Left;  . Peripheral vascular catheterization  05/01/2014    Procedure: RENAL INTERVENTION;  Surgeon: Wellington Hampshire, MD; 6 x 15 mm Herculink stent to the Left Renal Artery     Current Outpatient Prescriptions  Medication Sig Dispense Refill  . albuterol (PROVENTIL HFA;VENTOLIN HFA) 108 (90 Base) MCG/ACT inhaler Inhale 2 puffs into  the lungs every 6 (six) hours as needed for wheezing or shortness of breath. 1 Inhaler 0  . Ascorbic Acid (VITAMIN C CR) 1500 MG TBCR Take by mouth daily.    Marland Kitchen aspirin 81 MG tablet Take 81 mg by mouth daily.    . B Complex Vitamins (VITAMIN B COMPLEX PO) Take by mouth.    . Calcium-Vitamin D 600-200 MG-UNIT per tablet Take by mouth.    . carvedilol (COREG) 6.25 MG tablet Take 1 tablet (6.25 mg total) by mouth 2 (two) times daily with a meal. 180 tablet 3  . Cholecalciferol 2000 UNITS TABS Take 1 tablet by mouth daily.    . ciclopirox (PENLAC) 8 % solution Apply topically at bedtime. Apply over nail and surrounding skin. Apply daily over  previous coat. After seven (7) days, may remove with alcohol and continue cycle. 6.6 mL 4  . doxycycline (VIBRA-TABS) 100 MG tablet Take 1 tablet (100 mg total) by mouth 2 (two) times daily. 20 tablet 0  . fexofenadine (ALLEGRA) 180 MG tablet Take by mouth. Reported on 07/10/2015    . Fish Oil OIL by Does not apply route.    . fluticasone (FLONASE) 50 MCG/ACT nasal spray Place 1 spray into both nostrils daily as needed. 16 g 2  . hydrALAZINE (APRESOLINE) 25 MG tablet Take 1 tablet (25 mg total) by mouth 3 (three) times daily. (Patient taking differently: Take 25 mg by mouth as needed. ) 90 tablet 6  . ibuprofen (ADVIL,MOTRIN) 200 MG tablet Take by mouth.    . losartan-hydrochlorothiazide (HYZAAR) 100-25 MG tablet Take 1 tablet by mouth daily. 90 tablet 3  . Lysine HCl 500 MG CAPS Take 1,000 mg by mouth daily as needed (for cold sores).     . niacin (NIASPAN) 1000 MG CR tablet Take 1,000 mg by mouth at bedtime.    . Omega-3 Fatty Acids (FISH OIL) 1200 MG CAPS Take by mouth.    . pantoprazole (PROTONIX) 40 MG tablet Take 1 tablet (40 mg total) by mouth daily. 90 tablet 3  . traZODone (DESYREL) 50 MG tablet Take 0.5-1 tablets (25-50 mg total) by mouth at bedtime as needed for sleep. 90 tablet 0  . Zinc 50 MG TABS Take by mouth daily.     No current facility-administered medications for this visit.    Allergies:   Atorvastatin; Penicillins; Sulfa antibiotics; and Amlodipine    Social History:  The patient  reports that she has never smoked. She has never used smokeless tobacco. She reports that she drinks alcohol. She reports that she does not use illicit drugs.   Family History:  The patient's family history includes Cancer in her paternal grandmother; Parkinsonism in her mother; Sarcoidosis in her brother; Stroke in her maternal grandmother.    ROS:  Please see the history of present illness.   Otherwise, review of systems are positive for none.   All other systems are reviewed and negative.     PHYSICAL EXAM: VS:  BP 160/64 mmHg  Pulse 83  Ht '5\' 1"'$  (1.549 m)  Wt 127 lb (57.607 kg)  BMI 24.01 kg/m2  SpO2 97%  LMP  (LMP Unknown) , BMI Body mass index is 24.01 kg/(m^2). GEN: Well nourished, well developed, in no acute distress HEENT: normal Neck: no JVD, carotid bruits, or masses Cardiac: RRR; no murmurs, rubs, or gallops,no edema  Respiratory:  clear to auscultation bilaterally, normal work of breathing GI: soft, nontender, nondistended, + BS MS: no deformity or atrophy Skin: warm  and dry, no rash Neuro:  Strength and sensation are intact Psych: euthymic mood, full affect. She appears to be anxious.   EKG:  EKG is not ordered today.    Recent Labs: 09/18/2015: ALT 15; BUN 15; Creatinine, Ser 0.83; Hemoglobin 11.8*; Platelets 237.0; Potassium 4.9; Sodium 135; TSH 0.51    Lipid Panel    Component Value Date/Time   CHOL 186 02/19/2015 1438   CHOL 177 04/03/2014 0400   TRIG 76.0 02/19/2015 1438   TRIG 138 04/03/2014 0400   HDL 68.90 02/19/2015 1438   HDL 62* 04/03/2014 0400   CHOLHDL 3 02/19/2015 1438   VLDL 15.2 02/19/2015 1438   VLDL 28 04/03/2014 0400   LDLCALC 101* 02/19/2015 1438   LDLCALC 87 04/03/2014 0400   LDLDIRECT 201.7 02/05/2013 1050      Wt Readings from Last 3 Encounters:  10/20/15 127 lb (57.607 kg)  09/18/15 125 lb 8 oz (56.926 kg)  08/22/15 125 lb 12 oz (57.04 kg)        ASSESSMENT AND PLAN:  1.   Status post Left renal artery stent placement for renovascular hypertension: Renal artery duplex in November 2016 showed patent stent. Repeat renal artery duplex in November 2017.  2. Essential hypertension: Very labile blood pressure with significant fluctuations. Her blood pressure has been running high the last few days. Given that the elevated blood pressure correlates with stress and anxiety, increasing her beta blocker might be the most helpful step. I increased the dose of carvedilol to 12.5 mg twice daily.  2. Hyperlipidemia:   she is on small dose niacin and fish oil with intolerance to statins.  Disposition:   FU with me in 2  months  Signed,  Kathlyn Sacramento, MD  10/20/2015 1:25 PM    [facility]

## 2015-10-20 NOTE — Telephone Encounter (Signed)
Noted. Pt in office for 1pm appt. Will make MD aware

## 2015-10-25 ENCOUNTER — Emergency Department: Payer: Medicare PPO

## 2015-10-25 ENCOUNTER — Emergency Department
Admission: EM | Admit: 2015-10-25 | Discharge: 2015-10-25 | Disposition: A | Discharge status: Home / Self Care | Payer: Medicare PPO | Attending: Emergency Medicine | Admitting: Emergency Medicine

## 2015-10-25 ENCOUNTER — Encounter: Payer: Self-pay | Admitting: Emergency Medicine

## 2015-10-25 DIAGNOSIS — R531 Weakness: Secondary | ICD-10-CM | POA: Diagnosis not present

## 2015-10-25 DIAGNOSIS — Z7951 Long term (current) use of inhaled steroids: Secondary | ICD-10-CM | POA: Insufficient documentation

## 2015-10-25 DIAGNOSIS — I10 Essential (primary) hypertension: Secondary | ICD-10-CM | POA: Insufficient documentation

## 2015-10-25 DIAGNOSIS — Z79899 Other long term (current) drug therapy: Secondary | ICD-10-CM | POA: Diagnosis not present

## 2015-10-25 DIAGNOSIS — Z791 Long term (current) use of non-steroidal anti-inflammatories (NSAID): Secondary | ICD-10-CM | POA: Diagnosis not present

## 2015-10-25 DIAGNOSIS — Z96652 Presence of left artificial knee joint: Secondary | ICD-10-CM | POA: Diagnosis not present

## 2015-10-25 DIAGNOSIS — Z792 Long term (current) use of antibiotics: Secondary | ICD-10-CM | POA: Insufficient documentation

## 2015-10-25 DIAGNOSIS — R109 Unspecified abdominal pain: Secondary | ICD-10-CM | POA: Diagnosis not present

## 2015-10-25 DIAGNOSIS — Z85118 Personal history of other malignant neoplasm of bronchus and lung: Secondary | ICD-10-CM | POA: Diagnosis not present

## 2015-10-25 DIAGNOSIS — Z7982 Long term (current) use of aspirin: Secondary | ICD-10-CM | POA: Insufficient documentation

## 2015-10-25 LAB — BASIC METABOLIC PANEL
Anion gap: 8 (ref 5–15)
BUN: 17 mg/dL (ref 6–20)
CALCIUM: 9.2 mg/dL (ref 8.9–10.3)
CO2: 27 mmol/L (ref 22–32)
Chloride: 95 mmol/L — ABNORMAL LOW (ref 101–111)
Creatinine, Ser: 0.68 mg/dL (ref 0.44–1.00)
GFR calc Af Amer: >60 mL/min (ref >60)
GLUCOSE: 100 mg/dL — AB (ref 65–99)
POTASSIUM: 4.1 mmol/L (ref 3.5–5.1)
Sodium: 130 mmol/L — ABNORMAL LOW (ref 135–145)

## 2015-10-25 LAB — DIFFERENTIAL
BASOS PCT: 1 %
Basophils Absolute: 0 10*3/uL (ref 0–0.1)
EOS PCT: 1 %
Eosinophils Absolute: 0 10*3/uL (ref 0–0.7)
LYMPHS PCT: 27 %
Lymphs Abs: 1.3 10*3/uL (ref 1.0–3.6)
MONO ABS: 0.5 10*3/uL (ref 0.2–0.9)
Monocytes Relative: 10 %
Neutro Abs: 3 10*3/uL (ref 1.4–6.5)
Neutrophils Relative %: 61 %

## 2015-10-25 LAB — URINALYSIS COMPLETE WITH MICROSCOPIC (ARMC ONLY)
Bacteria, UA: NONE SEEN
Bilirubin Urine: NEGATIVE
Glucose, UA: NEGATIVE mg/dL
Hgb urine dipstick: NEGATIVE
KETONES UR: NEGATIVE mg/dL
Leukocytes, UA: NEGATIVE
Nitrite: NEGATIVE
PROTEIN: NEGATIVE mg/dL
Specific Gravity, Urine: 1.004 — ABNORMAL LOW (ref 1.005–1.030)
Squamous Epithelial / LPF: NONE SEEN
pH: 7 (ref 5.0–8.0)

## 2015-10-25 LAB — LIPASE, BLOOD: Lipase: 37 U/L (ref 11–51)

## 2015-10-25 LAB — HEPATIC FUNCTION PANEL
ALK PHOS: 94 U/L (ref 38–126)
ALT: 18 U/L (ref 14–54)
AST: 25 U/L (ref 15–41)
Albumin: 3.9 g/dL (ref 3.5–5.0)
Total Bilirubin: 0.7 mg/dL (ref 0.3–1.2)
Total Protein: 6.7 g/dL (ref 6.5–8.1)

## 2015-10-25 LAB — CBC
HCT: 37.6 % (ref 35.0–47.0)
Hemoglobin: 13.1 g/dL (ref 12.0–16.0)
MCH: 30 pg (ref 26.0–34.0)
MCHC: 34.9 g/dL (ref 32.0–36.0)
MCV: 85.9 fL (ref 80.0–100.0)
PLATELETS: 210 10*3/uL (ref 150–440)
RBC: 4.38 MIL/uL (ref 3.80–5.20)
RDW: 13.5 % (ref 11.5–14.5)
WBC: 4.9 10*3/uL (ref 3.6–11.0)

## 2015-10-25 LAB — TROPONIN I: Troponin I: <0.03 ng/mL (ref <0.03)

## 2015-10-25 MED ORDER — DIATRIZOATE MEGLUMINE & SODIUM 66-10 % PO SOLN
15.0000 mL | Freq: Once | ORAL | Status: AC
  Administered 2015-10-25: 15 mL via ORAL

## 2015-10-25 MED ORDER — IOPAMIDOL (ISOVUE-300) INJECTION 61%
100.0000 mL | Freq: Once | INTRAVENOUS | Status: AC | PRN
  Administered 2015-10-25: 100 mL via INTRAVENOUS

## 2015-10-25 NOTE — ED Notes (Signed)
Pt to ed with c/o intermittent weakness over the last week after increasing carvedilol.

## 2015-10-25 NOTE — Discharge Instructions (Signed)
Abdominal Pain, Adult Many things can cause abdominal pain. Usually, abdominal pain is not caused by a disease and will improve without treatment. It can often be observed and treated at home. Your health care provider will do a physical exam and possibly order blood tests and X-rays to help determine the seriousness of your pain. However, in many cases, more time must pass before a clear cause of the pain can be found. Before that point, your health care provider may not know if you need more testing or further treatment. HOME CARE INSTRUCTIONS Monitor your abdominal pain for any changes. The following actions may help to alleviate any discomfort you are experiencing:  Only take over-the-counter or prescription medicines as directed by your health care provider.  Do not take laxatives unless directed to do so by your health care provider.  Try a clear liquid diet (broth, tea, or water) as directed by your health care provider. Slowly move to a bland diet as tolerated. SEEK MEDICAL CARE IF:  You have unexplained abdominal pain.  You have abdominal pain associated with nausea or diarrhea.  You have pain when you urinate or have a bowel movement.  You experience abdominal pain that wakes you in the night.  You have abdominal pain that is worsened or improved by eating food.  You have abdominal pain that is worsened with eating fatty foods.  You have a fever. SEEK IMMEDIATE MEDICAL CARE IF:  Your pain does not go away within 2 hours.  You keep throwing up (vomiting).  Your pain is felt only in portions of the abdomen, such as the right side or the left lower portion of the abdomen.  You pass bloody or black tarry stools. MAKE SURE YOU:  Understand these instructions.  Will watch your condition.  Will get help right away if you are not doing well or get worse.   This information is not intended to replace advice given to you by your health care provider. Make sure you discuss  any questions you have with your health care provider.   Document Released: 12/30/2004 Document Revised: 12/11/2014 Document Reviewed: 11/29/2012 Elsevier Interactive Patient Education 2016 [place] follow-up with your regular doctor. I would have him see you early this coming week. Your sodium is slightly low. He may want to increase the amount of salt UV with your food for a few days. That may increase her blood pressure slightly so it is very important again to follow up with your doctor. Please return if you're worse at all fever vomiting worse pain feeling sicker etc.

## 2015-10-25 NOTE — ED Notes (Signed)
Patient transported to CT 

## 2015-10-25 NOTE — ED Provider Notes (Signed)
[facility] Emergency Department Provider Note   ____________________________________________  Time seen: Approximately 11:58 AM  I have reviewed the triage vital signs and the nursing notes.   HISTORY  Chief Complaint Weakness   HPI Tamara Burton is a 82 y.o. female patient reports feeling somewhat weak and a little bit nauseated. Symptoms have been going and on gradually increasing for some time - several weeks at least. Actually although she told the nurses the symptoms started after the carvedilol was increased today had actually had begun mildly before that. Patient's have been some mild abdominal pain as well as is getting worse. Patient reports she's lost her appetite. Patient is eating and has not lost weight but has to make herself eat. Patient is not running a fever. Patient is not vomiting. Patient is not having diarrhea. Nothing really seems to make the symptoms better or worse.Patient's blood pressure is been very labile going from over 202 120 and the course of a few hours even at home   Past Medical History  Diagnosis Date  . Hyperlipidemia   . Orthostatic hypotension   . Carotid artery disease ([place])   . PONV (postoperative nausea and vomiting)     "when I was younger"  . Hypertension   . OSA on CPAP     "wear it intermittently"  . Sinus headache   . Headache     "lately; related to HTN" (04/24/2014)  . Arthritis     "qwhere"  . Chronic lower back pain   . Lung cancer ([place]) 2003    s/p left lower lobe resection  . Non-obstructive CAD     a. 04/2014 Cath: LM nl, LAD/D1/LCX/RCA min irregs, RPDA/PAV nl.  . Renal artery stenosis ([place])     a. 04/2014 Renal angiography: LRA 95p, RRA 40ost. PTA of LRA deferred 2/2 guide cath induced Ao dissection;  01/27 6 x 15 mm Herculink stent to the L-RA     Patient Active Problem List   Diagnosis Date Noted  . Other fatigue 09/18/2015  . Abdominal pain, epigastric 09/18/2015  . Routine general  medical examination at a health care facility 02/19/2015  . History of right hip replacement 10/16/2014  . Toenail deformity 10/16/2014  . Abnormal chest CT 05/13/2014  . Hypertension   . Renovascular hypertension   . RAS (renal artery stenosis) ([place]) 04/24/2014  . Essential hypertension, benign 03/11/2014  . Rosacea 07/13/2013  . Statin intolerance 08/31/2012  . Insomnia 07/06/2012  . Medicare annual wellness visit, subsequent 11/26/2011  . Screening for breast cancer 11/26/2011  . History of lung cancer 11/26/2011  . Hyperlipidemia with target LDL less than 100 11/26/2011  . Carotid stenosis 05/21/2011  . Lung cancer ([place]) 05/21/2011  . Sleep apnea 05/21/2011  . Osteoarthritis 05/21/2011    Past Surgical History  Procedure Laterality Date  . Appendectomy    . Total hip arthroplasty Right   . Replacement total knee Left   . Bilateral oophorectomy Bilateral 2000's  . Vaginal delivery  X 2  . Cataract extraction w/ intraocular lens  implant, bilateral Bilateral   . Cardiac catheterization  04/24/2014  . Renal angiogram  04/24/2014  . Joint replacement    . Vaginal hysterectomy  1980's  . Lung lobectomy Left 2003    "lower lobe"  . Tubal ligation  ~ 1975  . Left heart catheterization with coronary angiogram N/A 04/24/2014    Procedure: LEFT HEART CATHETERIZATION WITH CORONARY ANGIOGRAM;  Surgeon: Wellington Hampshire, MD;  Location:  [place] CATH LAB;  Service: Cardiovascular;  Laterality: N/A;  . Renal angiogram N/A 04/24/2014    Procedure: RENAL ANGIOGRAM;  Surgeon: Wellington Hampshire, MD;  Location: [place] CATH LAB;  Service: Cardiovascular;  Laterality: N/A;  . Renal angiogram Left 05/01/2014    Procedure: RENAL ANGIOGRAM;  Surgeon: Wellington Hampshire, MD;  Location: [place] CATH LAB;  Service: Cardiovascular;  Laterality: Left;  . Peripheral vascular catheterization  05/01/2014    Procedure: RENAL INTERVENTION;  Surgeon: Wellington Hampshire, MD; 6 x 15 mm Herculink stent to the Left Renal Artery     Current Outpatient Rx  Name  Route  Sig  Dispense  Refill  . albuterol (PROVENTIL HFA;VENTOLIN HFA) 108 (90 Base) MCG/ACT inhaler   Inhalation   Inhale 2 puffs into the lungs every 6 (six) hours as needed for wheezing or shortness of breath.   1 Inhaler   0   . Ascorbic Acid (VITAMIN C CR) 1500 MG TBCR   Oral   Take by mouth daily.         Marland Kitchen aspirin 81 MG tablet   Oral   Take 81 mg by mouth daily.         . B Complex Vitamins (VITAMIN B COMPLEX PO)   Oral   Take by mouth.         . Calcium-Vitamin D 600-200 MG-UNIT per tablet   Oral   Take by mouth.         . carvedilol (COREG) 12.5 MG tablet   Oral   Take 1 tablet (12.5 mg total) by mouth 2 (two) times daily with a meal.   60 tablet   2   . Cholecalciferol 2000 UNITS TABS   Oral   Take 1 tablet by mouth daily.         . ciclopirox (PENLAC) 8 % solution   Topical   Apply topically at bedtime. Apply over nail and surrounding skin. Apply daily over previous coat. After seven (7) days, may remove with alcohol and continue cycle.   6.6 mL   4   . doxycycline (VIBRA-TABS) 100 MG tablet   Oral   Take 1 tablet (100 mg total) by mouth 2 (two) times daily.   20 tablet   0   . fexofenadine (ALLEGRA) 180 MG tablet   Oral   Take by mouth. Reported on 07/10/2015         . Fish Oil OIL   Does not apply   by Does not apply route.         . fluticasone (FLONASE) 50 MCG/ACT nasal spray   Each Nare   Place 1 spray into both nostrils daily as needed.   16 g   2   . hydrALAZINE (APRESOLINE) 25 MG tablet   Oral   Take 1 tablet (25 mg total) by mouth 3 (three) times daily. Patient taking differently: Take 25 mg by mouth as needed.    90 tablet   6   . ibuprofen (ADVIL,MOTRIN) 200 MG tablet   Oral   Take by mouth.         . losartan-hydrochlorothiazide (HYZAAR) 100-25 MG tablet   Oral   Take 1 tablet by mouth daily.   90 tablet   3   . Lysine HCl 500 MG CAPS   Oral   Take 1,000 mg by mouth  daily as needed (for cold sores).          . niacin (NIASPAN) 1000 MG CR  tablet   Oral   Take 1,000 mg by mouth at bedtime.         . Omega-3 Fatty Acids (FISH OIL) 1200 MG CAPS   Oral   Take by mouth.         . pantoprazole (PROTONIX) 40 MG tablet   Oral   Take 1 tablet (40 mg total) by mouth daily.   90 tablet   3   . traZODone (DESYREL) 50 MG tablet   Oral   Take 0.5-1 tablets (25-50 mg total) by mouth at bedtime as needed for sleep.   90 tablet   0   . Zinc 50 MG TABS   Oral   Take by mouth daily.           Allergies Atorvastatin; Penicillins; Sulfa antibiotics; and Amlodipine  Family History  Problem Relation Age of Onset  . Parkinsonism Mother   . Sarcoidosis Brother   . Stroke Maternal Grandmother   . Cancer Paternal Grandmother     Social History Social History  Substance Use Topics  . Smoking status: Never Smoker   . Smokeless tobacco: Never Used  . Alcohol Use: Yes     Comment: 04/24/2014 "glass of beer or wine q couple weeks"    Review of Systems Constitutional: No fever/chills Eyes: No visual changes. ENT: No sore throat. Cardiovascular: Denies chest pain. Respiratory: Denies shortness of breath. Gastrointestinal: See history of present illness Genitourinary: Negative for dysuria. Musculoskeletal: Negative for back pain. Skin: Negative for rash. Neurological: Negative for headaches, focal weakness or numbness.  10-point ROS otherwise negative.  ____________________________________________   PHYSICAL EXAM:  VITAL SIGNS: ED Triage Vitals  Enc Vitals Group     BP 10/25/15 0944 126/63 mmHg     Pulse Rate 10/25/15 0944 59     Resp 10/25/15 0944 20     Temp 10/25/15 0944 98.3 F (36.8 C)     Temp Source 10/25/15 0944 Oral     SpO2 10/25/15 0944 99 %     Weight 10/25/15 0944 123 lb (55.792 kg)     Height 10/25/15 0944 '5\' 1"'$  (1.549 m)     Head Cir --      Peak Flow --      Pain Score 10/25/15 0945 3     Pain Loc --       Pain Edu? --      Excl. in GC? --    } Constitutional: Alert and oriented. Well appearing and in no acute distress. Eyes: Conjunctivae are normal. PERRL. EOMI. Head: Atraumatic. Nose: No congestion/rhinnorhea. Mouth/Throat: Mucous membranes are moist.  Oropharynx non-erythematous. Neck: No stridor.  Cardiovascular: Normal rate, regular rhythm. Grossly normal heart sounds.  Good peripheral circulation. Respiratory: Normal respiratory effort.  No retractions. Lungs CTAB. Gastrointestinal: Soft but mildly diffusely tender No distention. No abdominal bruits. No CVA tenderness. Musculoskeletal: No lower extremity tenderness nor edema.  No joint effusions. Neurologic:  Normal speech and language. No gross focal neurologic deficits are appreciated. No gait instability. Skin:  Skin is warm, dry and intact. No rash noted. Psychiatric: Mood and affect are normal. Speech and behavior are normal.  ____________________________________________   LABS (all labs ordered are listed, but only abnormal results are displayed)  Labs Reviewed  BASIC METABOLIC PANEL - Abnormal; Notable for the following:    Sodium 130 (*)    Chloride 95 (*)    Glucose, Bld 100 (*)    All other components within normal limits  HEPATIC FUNCTION PANEL -  Abnormal; Notable for the following:    Bilirubin, Direct <0.1 (*)    All other components within normal limits  URINALYSIS COMPLETEWITH MICROSCOPIC ([facility] ONLY) - Abnormal; Notable for the following:    Color, Urine YELLOW (*)    APPearance CLEAR (*)    Specific Gravity, Urine 1.004 (*)    All other components within normal limits  CBC  TROPONIN I  LIPASE, BLOOD  DIFFERENTIAL   ____________________________________________  EKG  EKG read and interpreted by me shows sinus bradycardia rate of 57 normal axis possibly some decrease in R wave progression but otherwise normal ____________________________________________  RADIOLOGY  CLINICAL DATA: Umbilical pain  and nausea. History of lung carcinoma. Prior appendectomy and hysterectomy.  EXAM: CT ABDOMEN AND PELVIS WITH CONTRAST  TECHNIQUE: Multidetector CT imaging of the abdomen and pelvis was performed using the standard protocol following bolus administration of intravenous contrast.  CONTRAST: 163m ISOVUE-300 IOPAMIDOL (ISOVUE-300) INJECTION 61%  COMPARISON: CT of the chest on 04/18/2014  FINDINGS: Lower chest: No acute findings.  Hepatobiliary: Stable 8 mm cyst at the dome of the liver. The gallbladder appears normal.  Pancreas: No mass, inflammatory changes, or other significant abnormality.  Spleen: Within normal limits in size and appearance.  Adrenals/Urinary Tract: Adrenal glands are unremarkable. There is a nonobstructing calculus in the upper pole of the right kidney measuring approximately 6 x 9 x 10 mm. Simple cyst of the lower right kidney has a benign appearance.  Stomach/Bowel: No evidence of obstruction, inflammatory process, or abnormal fluid collections. No free air identified. No evidence of focal abscess.  Vascular/Lymphatic: No pathologically enlarged lymph nodes. The abdominal aorta and iliac arteries are heavily calcified. No evidence of aortic aneurysm. There is a proximal left renal artery stent.  Reproductive: Status post hysterectomy. No pelvic masses identified.  Other: None.  Musculoskeletal: Advanced spondylosis seen throughout the visualized lower thoracic and lumbar spines. No evidence of fracture or bony lesion.  IMPRESSION: 1. No acute findings in the abdomen or pelvis. 2. Nonobstructing right renal calculus measuring 10 mm in greatest diameter.   Electronically Signed  By: GAletta EdouardM.D.  On: 10/25/2015 12:01 ____________________________________________   PROCEDURES    Procedures  ____________________________________________   INITIAL IMPRESSION / ASSESSMENT AND PLAN / ED COURSE  Pertinent  labs & imaging results that were available during my care of the patient were reviewed by me and considered in my medical decision making (see chart for details).  ____________________________________________   FINAL CLINICAL IMPRESSION(S) / ED DIAGNOSES  Final diagnoses:  Weakness  Abdominal pain, unspecified abdominal location      NEW MEDICATIONS STARTED DURING THIS VISIT:  Discharge Medication List as of 10/25/2015  2:41 PM       Note:  This document was prepared using Dragon voice recognition software and may include unintentional dictation errors.    PNena Polio MD 10/25/15 [phone]

## 2015-10-27 ENCOUNTER — Telehealth: Payer: Self-pay | Admitting: *Deleted

## 2015-10-27 NOTE — Telephone Encounter (Signed)
Lab appt later this week. Will recheck.

## 2015-10-27 NOTE — Telephone Encounter (Signed)
Please schedule later this week, thanks

## 2015-10-27 NOTE — Telephone Encounter (Signed)
Patient was advised by [facility]  to have a sodium level checked by her  PCP. She was seen at [facility] on 10/25/15. Please advise if she should have a lab appt scheduled. Pt contact [phone]

## 2015-10-27 NOTE — Telephone Encounter (Signed)
Scheduled

## 2015-10-27 NOTE — Telephone Encounter (Signed)
Please advise and order a Sodium level if needed, thanks

## 2015-10-29 ENCOUNTER — Telehealth: Payer: Self-pay

## 2015-10-29 DIAGNOSIS — E871 Hypo-osmolality and hyponatremia: Secondary | ICD-10-CM

## 2015-10-29 NOTE — Telephone Encounter (Signed)
Put in BMP. Dx hyponatremia.

## 2015-10-29 NOTE — Telephone Encounter (Signed)
BMP ordered. Thank you.

## 2015-10-29 NOTE — Telephone Encounter (Signed)
Pt coming for labs 10/30/15. Read telephone message and placed an order for sodium. Please advise if any other labs need to be re-checked. Thank you.

## 2015-10-30 ENCOUNTER — Other Ambulatory Visit (INDEPENDENT_AMBULATORY_CARE_PROVIDER_SITE_OTHER): Payer: Medicare PPO

## 2015-10-30 ENCOUNTER — Telehealth: Payer: Self-pay | Admitting: *Deleted

## 2015-10-30 DIAGNOSIS — E871 Hypo-osmolality and hyponatremia: Secondary | ICD-10-CM

## 2015-10-30 LAB — BASIC METABOLIC PANEL
BUN: 17 mg/dL (ref 6–23)
CHLORIDE: 99 meq/L (ref 96–112)
CO2: 29 meq/L (ref 19–32)
CREATININE: 0.85 mg/dL (ref 0.40–1.20)
Calcium: 9.5 mg/dL (ref 8.4–10.5)
GFR: 68.36 mL/min (ref >60.00)
GLUCOSE: 100 mg/dL — AB (ref 70–99)
Potassium: 4.1 mEq/L (ref 3.5–5.1)
Sodium: 135 mEq/L (ref 135–145)

## 2015-10-30 NOTE — Telephone Encounter (Signed)
Patient dropped off a Handicap placard form to be filled out. Form place in folder In front office.

## 2015-10-31 NOTE — Telephone Encounter (Signed)
Form completed and placed up front. Patient was called and informed.

## 2015-10-31 NOTE — Telephone Encounter (Signed)
Provider was given paperwork to fill out and sign.

## 2015-11-14 ENCOUNTER — Other Ambulatory Visit: Payer: Self-pay | Admitting: Cardiovascular Disease

## 2015-11-14 MED ORDER — CARVEDILOL 12.5 MG PO TABS: 12.5000 mg | ORAL_TABLET | Freq: Two times a day (BID) | ORAL | 3 refills | Status: DC

## 2015-11-14 MED ORDER — LOSARTAN POTASSIUM-HCTZ 100-25 MG PO TABS: 1.0000 | ORAL_TABLET | Freq: Every day | ORAL | 3 refills | Status: DC

## 2015-11-14 MED ORDER — PANTOPRAZOLE SODIUM 40 MG PO TBEC: 40.0000 mg | DELAYED_RELEASE_TABLET | Freq: Every day | ORAL | 3 refills | Status: DC

## 2015-11-14 NOTE — Telephone Encounter (Signed)
°*  STAT* If patient is at the pharmacy, call can be transferred to refill team.   1. Which medications need to be refilled? (please list name of each medication and dose if known) Carvedilol, Losartan, Pantoprazole   2. Which pharmacy/location (including street and city if local pharmacy) is medication to be sent to? [facility]  3. Do they need a 30 day or 90 day supply? 90 day    Pt only has a few days worth left. Please call in as soon as we can.

## 2015-12-25 ENCOUNTER — Ambulatory Visit (INDEPENDENT_AMBULATORY_CARE_PROVIDER_SITE_OTHER): Payer: Medicare PPO | Admitting: Cardiovascular Disease

## 2015-12-25 ENCOUNTER — Encounter: Payer: Self-pay | Admitting: Cardiovascular Disease

## 2015-12-25 VITALS — BP 130/62 | HR 66 | Ht 60.0 in | Wt 127.8 lb

## 2015-12-25 DIAGNOSIS — E785 Hyperlipidemia, unspecified: Secondary | ICD-10-CM

## 2015-12-25 DIAGNOSIS — I701 Atherosclerosis of renal artery: Secondary | ICD-10-CM

## 2015-12-25 DIAGNOSIS — I10 Essential (primary) hypertension: Secondary | ICD-10-CM

## 2015-12-25 NOTE — Patient Instructions (Signed)
Medication Instructions: Continue same medications.   Labwork: NONE.   Procedures/Testing: None.   Follow-Up: 6 months with Dr. Fletcher Anon.   Any Additional Special Instructions Will Be Listed Below (If Applicable).     If you need a refill on your cardiac medications before your next appointment, please call your pharmacy.

## 2015-12-25 NOTE — Progress Notes (Signed)
Cardiology Office Note   Date:  12/25/2015   ID:  Tamara Burton, DOB [birth year], MRN [id number]  PCP:  Coral Spikes, DO  Cardiologist:   Kathlyn Sacramento, MD   Chief Complaint  Patient presents with  . other    2 month follow up. Meds reviewed verbally with pt. patient c/o not eating as much as normal. overall "doing well"      History of Present Illness: Tamara Burton is a 82 y.o. female who presents for a follow-up visit regarding hypertension and renal artery stenosis status post left renal artery stenting in 04/2014 . Cardiac cath was done in January of 2016 due to chest pain and shortness of breath showed no significant coronary artery disease with normal left ventricular end-diastolic pressure.  She has known history of labile hypertension which typically correlates with anxiety.  She was seen a few months ago for elevated blood pressure. I increased the dose of carvedilol to 12.5 mg twice daily. Blood pressure has been well-controlled since then but she complains of increased fatigue.  Past Medical History:  Diagnosis Date  . Arthritis    "qwhere"  . Carotid artery disease ([place])   . Chronic lower back pain   . Headache    "lately; related to HTN" (04/24/2014)  . Hyperlipidemia   . Hypertension   . Lung cancer ([place]) 2003   s/p left lower lobe resection  . Non-obstructive CAD    a. 04/2014 Cath: LM nl, LAD/D1/LCX/RCA min irregs, RPDA/PAV nl.  . Orthostatic hypotension   . OSA on CPAP    "wear it intermittently"  . PONV (postoperative nausea and vomiting)    "when I was younger"  . Renal artery stenosis ([place])    a. 04/2014 Renal angiography: LRA 95p, RRA 40ost. PTA of LRA deferred 2/2 guide cath induced Ao dissection;  01/27 6 x 15 mm Herculink stent to the L-RA   . Sinus headache     Past Surgical History:  Procedure Laterality Date  . APPENDECTOMY    . BILATERAL OOPHORECTOMY Bilateral 2000's  . CARDIAC CATHETERIZATION  04/24/2014  . CATARACT EXTRACTION W/  INTRAOCULAR LENS  IMPLANT, BILATERAL Bilateral   . JOINT REPLACEMENT    . LEFT HEART CATHETERIZATION WITH CORONARY ANGIOGRAM N/A 04/24/2014   Procedure: LEFT HEART CATHETERIZATION WITH CORONARY ANGIOGRAM;  Surgeon: Wellington Hampshire, MD;  Location: [facility];  Service: Cardiovascular;  Laterality: N/A;  . LUNG LOBECTOMY Left 2003   "lower lobe"  . PERIPHERAL VASCULAR CATHETERIZATION  05/01/2014   Procedure: RENAL INTERVENTION;  Surgeon: Wellington Hampshire, MD; 6 x 15 mm Herculink stent to the Left Renal Artery  . RENAL ANGIOGRAM  04/24/2014  . RENAL ANGIOGRAM N/A 04/24/2014   Procedure: RENAL ANGIOGRAM;  Surgeon: Wellington Hampshire, MD;  Location: [facility];  Service: Cardiovascular;  Laterality: N/A;  . RENAL ANGIOGRAM Left 05/01/2014   Procedure: RENAL ANGIOGRAM;  Surgeon: Wellington Hampshire, MD;  Location: [facility];  Service: Cardiovascular;  Laterality: Left;  . REPLACEMENT TOTAL KNEE Left   . TOTAL HIP ARTHROPLASTY Right   . TUBAL LIGATION  ~ 1975  . VAGINAL DELIVERY  X 2  . VAGINAL HYSTERECTOMY  1980's     Current Outpatient Prescriptions  Medication Sig Dispense Refill  . albuterol (PROVENTIL HFA;VENTOLIN HFA) 108 (90 Base) MCG/ACT inhaler Inhale 2 puffs into the lungs every 6 (six) hours as needed for wheezing or shortness of breath. 1 Inhaler 0  . Ascorbic Acid (VITAMIN C  CR) 1500 MG TBCR Take by mouth daily.    Marland Kitchen aspirin 81 MG tablet Take 81 mg by mouth daily.    . B Complex Vitamins (VITAMIN B COMPLEX PO) Take by mouth.    . Calcium-Vitamin D 600-200 MG-UNIT per tablet Take by mouth.    . carvedilol (COREG) 12.5 MG tablet Take 1 tablet (12.5 mg total) by mouth 2 (two) times daily with a meal. 180 tablet 3  . Cholecalciferol 2000 UNITS TABS Take 1 tablet by mouth daily.    . ciclopirox (PENLAC) 8 % solution Apply topically at bedtime. Apply over nail and surrounding skin. Apply daily over previous coat. After seven (7) days, may remove with alcohol and continue cycle. 6.6 mL 4  .  doxycycline (VIBRA-TABS) 100 MG tablet Take 1 tablet (100 mg total) by mouth 2 (two) times daily. 20 tablet 0  . fexofenadine (ALLEGRA) 180 MG tablet Take by mouth. Reported on 07/10/2015    . Fish Oil OIL by Does not apply route.    . fluticasone (FLONASE) 50 MCG/ACT nasal spray Place 1 spray into both nostrils daily as needed. 16 g 2  . hydrALAZINE (APRESOLINE) 25 MG tablet Take 1 tablet (25 mg total) by mouth 3 (three) times daily. (Patient taking differently: Take 25 mg by mouth as needed. ) 90 tablet 6  . ibuprofen (ADVIL,MOTRIN) 200 MG tablet Take by mouth.    . losartan-hydrochlorothiazide (HYZAAR) 100-25 MG tablet Take 1 tablet by mouth daily. 90 tablet 3  . Lysine HCl 500 MG CAPS Take 1,000 mg by mouth daily as needed (for cold sores).     . niacin (NIASPAN) 1000 MG CR tablet Take 1,000 mg by mouth at bedtime.    . Omega-3 Fatty Acids (FISH OIL) 1200 MG CAPS Take by mouth.    . pantoprazole (PROTONIX) 40 MG tablet Take 1 tablet (40 mg total) by mouth daily. 90 tablet 3  . traZODone (DESYREL) 50 MG tablet Take 0.5-1 tablets (25-50 mg total) by mouth at bedtime as needed for sleep. 90 tablet 0  . Zinc 50 MG TABS Take by mouth daily.     No current facility-administered medications for this visit.     Allergies:   Atorvastatin; Penicillins; Sulfa antibiotics; and Amlodipine    Social History:  The patient  reports that she has never smoked. She has never used smokeless tobacco. She reports that she drinks alcohol. She reports that she does not use drugs.   Family History:  The patient's family history includes Cancer in her paternal grandmother; Parkinsonism in her mother; Sarcoidosis in her brother; Stroke in her maternal grandmother.    ROS:  Please see the history of present illness.   Otherwise, review of systems are positive for none.   All other systems are reviewed and negative.    PHYSICAL EXAM: VS:  BP 130/62 (BP Location: Left Arm, Patient Position: Sitting, Cuff Size:  Normal)   Pulse 66   Ht 5' (1.524 m)   Wt 127 lb 12 oz (57.9 kg)   LMP  (LMP Unknown)   BMI 24.95 kg/m  , BMI Body mass index is 24.95 kg/m. GEN: Well nourished, well developed, in no acute distress HEENT: normal Neck: no JVD, carotid bruits, or masses Cardiac: RRR; no murmurs, rubs, or gallops,no edema  Respiratory:  clear to auscultation bilaterally, normal work of breathing GI: soft, nontender, nondistended, + BS MS: no deformity or atrophy Skin: warm and dry, no rash Neuro:  Strength and sensation are  intact Psych: euthymic mood, full affect. She appears to be anxious.   EKG:  EKG is not ordered today.    Recent Labs: 09/18/2015: TSH 0.51 10/25/2015: ALT 18; Hemoglobin 13.1; Platelets 210 10/30/2015: BUN 17; Creatinine, Ser 0.85; Potassium 4.1; Sodium 135    Lipid Panel    Component Value Date/Time   CHOL 186 02/19/2015 1438   CHOL 177 04/03/2014 0400   TRIG 76.0 02/19/2015 1438   TRIG 138 04/03/2014 0400   HDL 68.90 02/19/2015 1438   HDL 62 (H) 04/03/2014 0400   CHOLHDL 3 02/19/2015 1438   VLDL 15.2 02/19/2015 1438   VLDL 28 04/03/2014 0400   LDLCALC 101 (H) 02/19/2015 1438   LDLCALC 87 04/03/2014 0400   LDLDIRECT 201.7 02/05/2013 1050      Wt Readings from Last 3 Encounters:  12/25/15 127 lb 12 oz (57.9 kg)  10/25/15 123 lb (55.8 kg)  10/20/15 127 lb (57.6 kg)        ASSESSMENT AND PLAN:  1.   Status post Left renal artery stent placement for renovascular hypertension: Renal artery duplex in November 2016 showed patent stent. Repeat renal artery duplex in November 2017.  2. Essential hypertension: Very labile blood pressure with significant fluctuations.  Blood pressure is now well controlled after increasing the dose of carvedilol. She complains of increased fatigue since then. She has known history of intolerance to multiple medications. I think the best option is to continue with same medications for now. We can consider decreasing carvedilol and  attempting small dose amlodipine again. She had poor intolerance amlodipine in the past.  2. Hyperlipidemia:  she is on small dose niacin and fish oil with intolerance to statins.  Disposition:   FU with me in 6  months  Signed,  Kathlyn Sacramento, MD  12/25/2015 3:53 PM    [place]

## 2016-01-05 ENCOUNTER — Encounter (INDEPENDENT_AMBULATORY_CARE_PROVIDER_SITE_OTHER): Payer: Medicare PPO

## 2016-01-05 ENCOUNTER — Ambulatory Visit (INDEPENDENT_AMBULATORY_CARE_PROVIDER_SITE_OTHER): Payer: Medicare PPO | Admitting: Vascular Surgery

## 2016-02-17 ENCOUNTER — Ambulatory Visit: Payer: Medicare PPO

## 2016-02-17 DIAGNOSIS — I701 Atherosclerosis of renal artery: Secondary | ICD-10-CM

## 2016-02-20 ENCOUNTER — Ambulatory Visit (INDEPENDENT_AMBULATORY_CARE_PROVIDER_SITE_OTHER): Payer: Medicare PPO | Admitting: Family Medicine

## 2016-02-20 ENCOUNTER — Encounter: Payer: Self-pay | Admitting: Family Medicine

## 2016-02-20 VITALS — BP 115/57 | HR 58 | Temp 97.7°F | Resp 14 | Ht 60.0 in | Wt 127.5 lb

## 2016-02-20 DIAGNOSIS — Z862 Personal history of diseases of the blood and blood-forming organs and certain disorders involving the immune mechanism: Secondary | ICD-10-CM | POA: Diagnosis not present

## 2016-02-20 DIAGNOSIS — E785 Hyperlipidemia, unspecified: Secondary | ICD-10-CM | POA: Diagnosis not present

## 2016-02-20 DIAGNOSIS — Z85118 Personal history of other malignant neoplasm of bronchus and lung: Secondary | ICD-10-CM

## 2016-02-20 DIAGNOSIS — I150 Renovascular hypertension: Secondary | ICD-10-CM | POA: Diagnosis not present

## 2016-02-20 DIAGNOSIS — M549 Dorsalgia, unspecified: Secondary | ICD-10-CM | POA: Insufficient documentation

## 2016-02-20 DIAGNOSIS — M545 Low back pain: Secondary | ICD-10-CM

## 2016-02-20 DIAGNOSIS — G8929 Other chronic pain: Secondary | ICD-10-CM

## 2016-02-20 LAB — CBC
HCT: 36.9 % (ref 36.0–46.0)
HEMOGLOBIN: 12.7 g/dL (ref 12.0–15.0)
MCHC: 34.3 g/dL (ref 30.0–36.0)
MCV: 87.8 fl (ref 78.0–100.0)
PLATELETS: 224 10*3/uL (ref 150.0–400.0)
RBC: 4.2 Mil/uL (ref 3.87–5.11)
RDW: 12.9 % (ref 11.5–15.5)
WBC: 6.3 10*3/uL (ref 4.0–10.5)

## 2016-02-20 LAB — LIPID PANEL
Cholesterol: 270 mg/dL — ABNORMAL HIGH (ref 0–200)
HDL: 64.4 mg/dL (ref >39.00)
LDL Cholesterol: 185 mg/dL — ABNORMAL HIGH (ref 0–99)
NONHDL: 205.66
Total CHOL/HDL Ratio: 4
Triglycerides: 105 mg/dL (ref 0.0–149.0)
VLDL: 21 mg/dL (ref 0.0–40.0)

## 2016-02-20 IMAGING — CR DG LUMBAR SPINE COMPLETE 4+V
5 series · 5 of 5 positions shown · non-contrast
Comparison: None.

CLINICAL DATA: Chronic back pain, worsening tonight.

EXAM:
LUMBAR SPINE - COMPLETE 4+ VIEW

[l-spine ap]
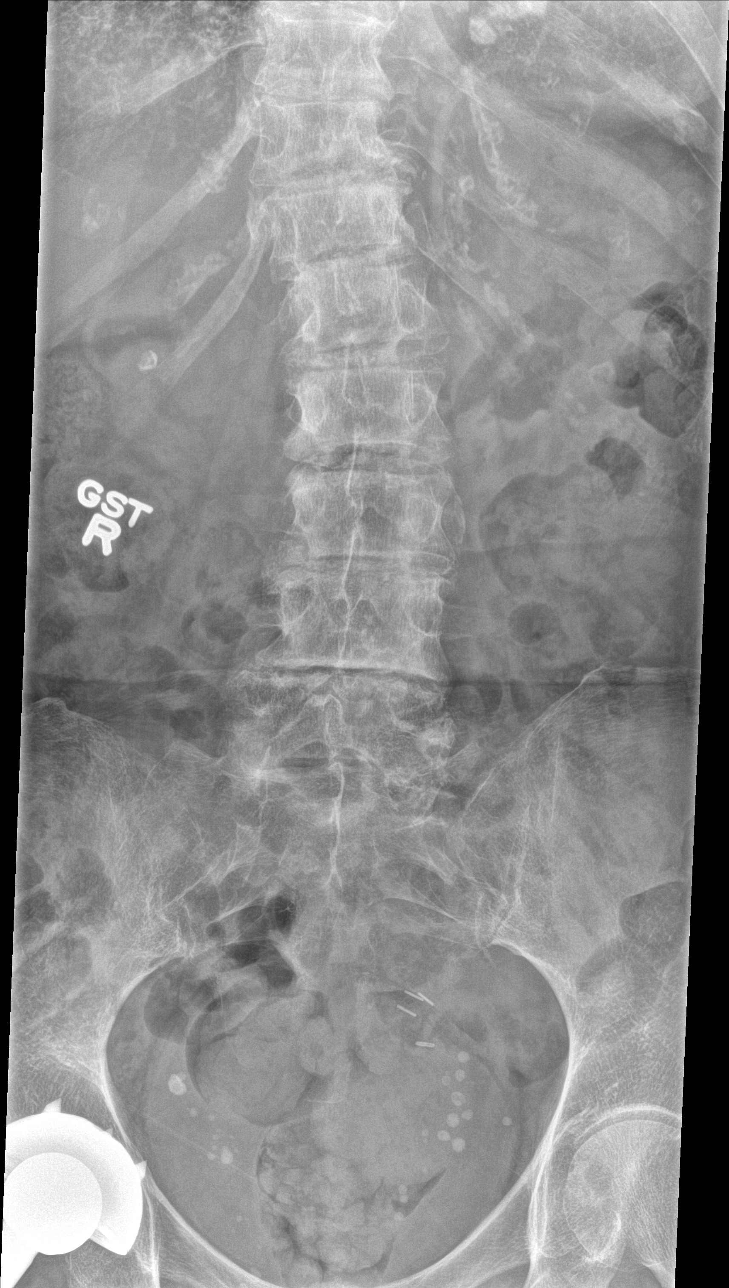

[l-spine obl (1 of 2)]
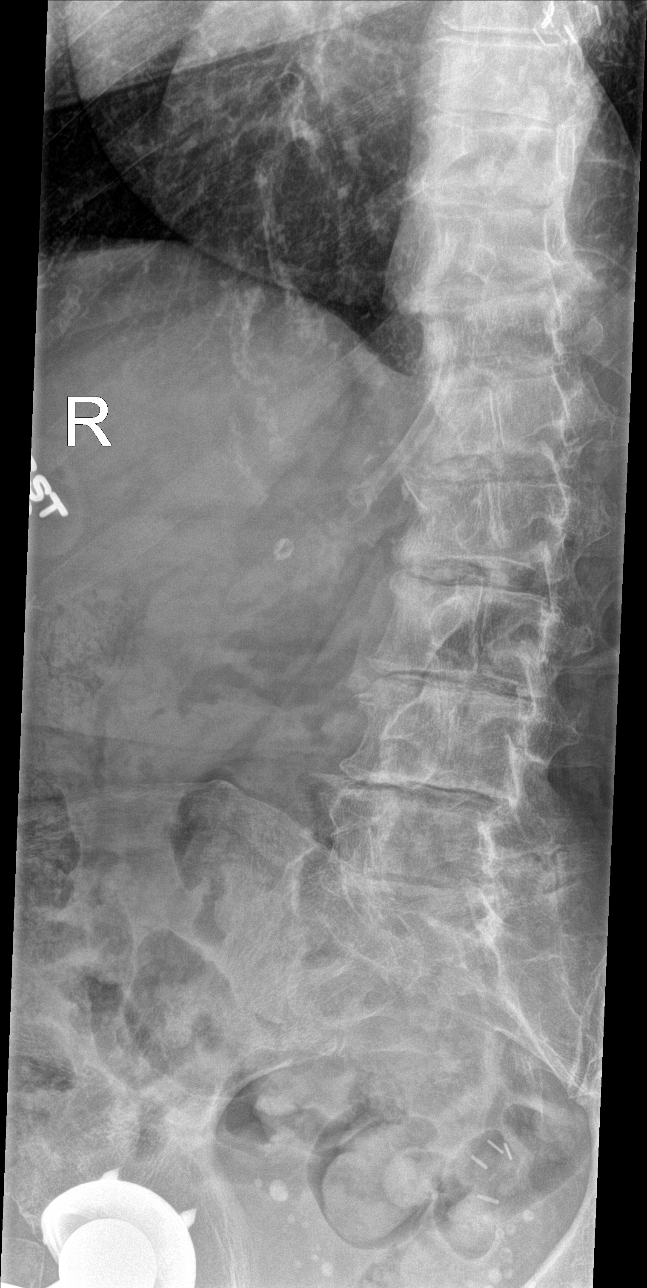

[l-spine obl (2 of 2)]
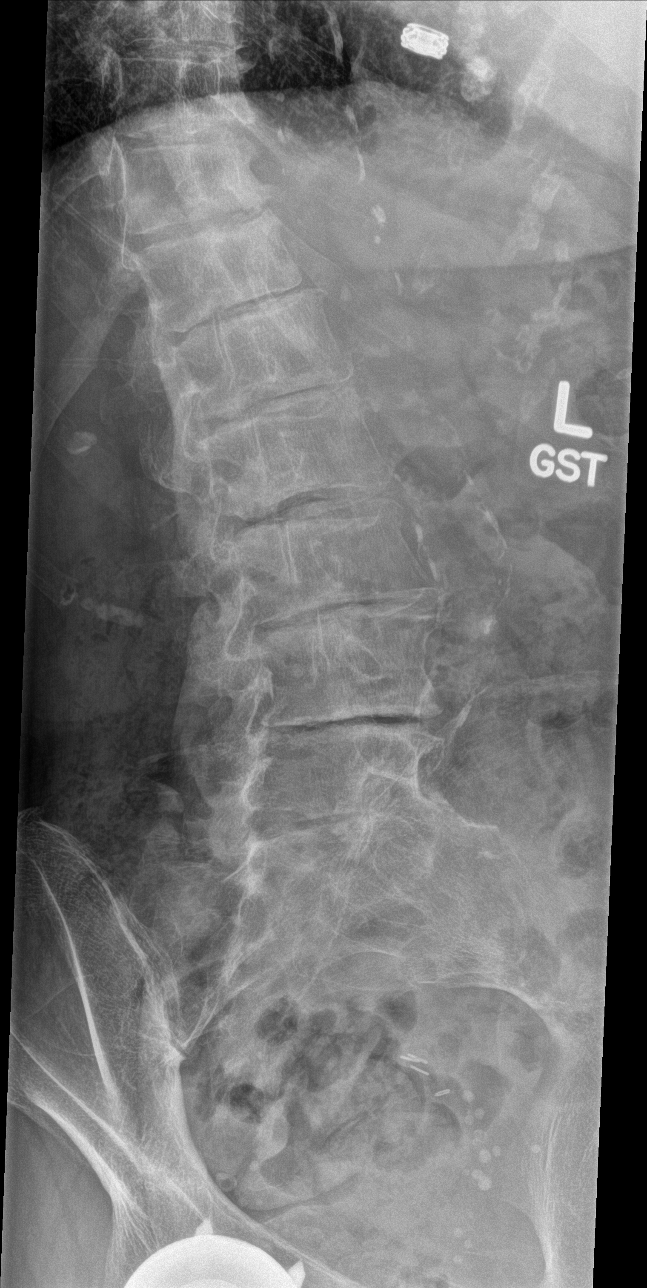

[l-spine lat]
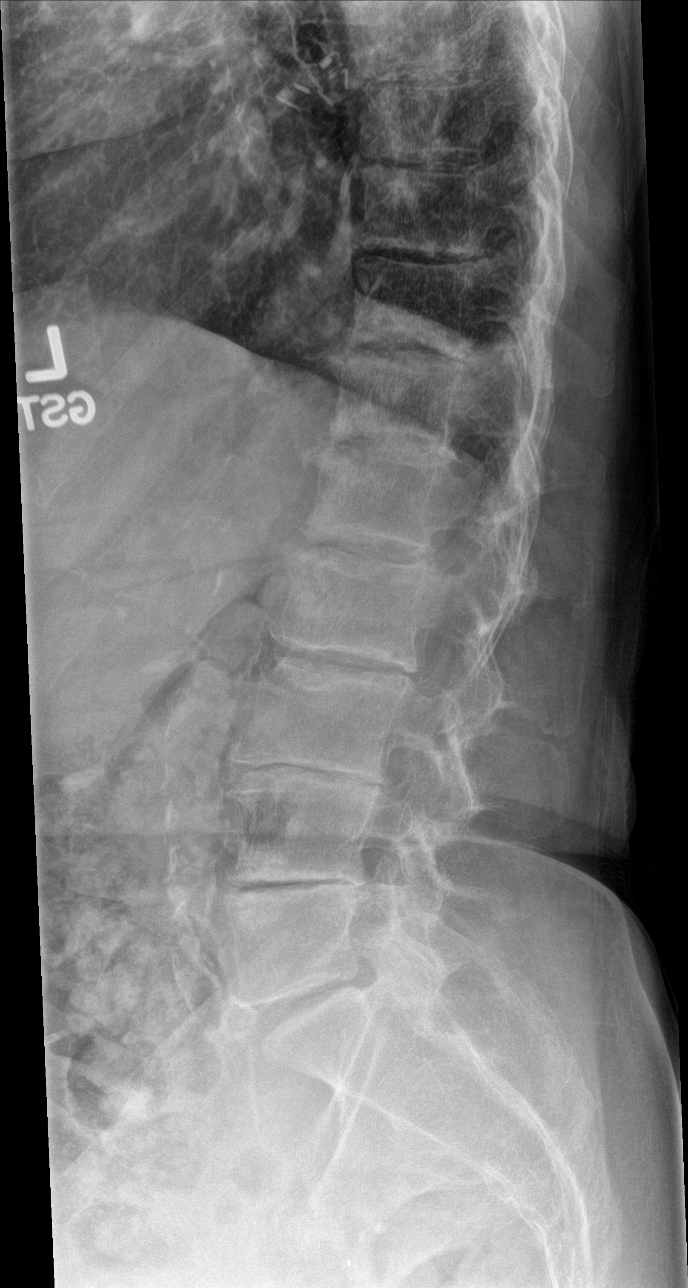

[l-spine spot]
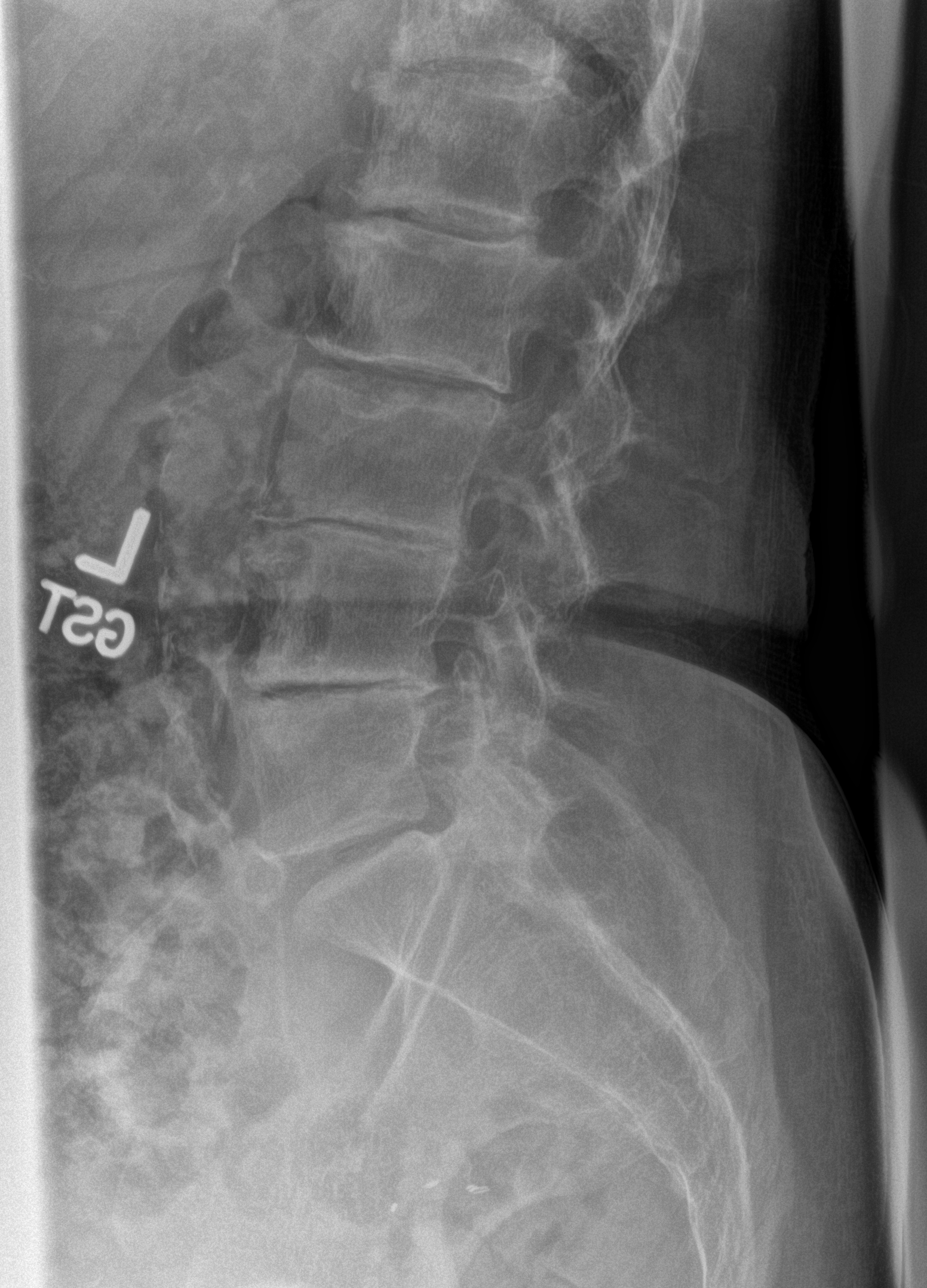

[5 of 5 positions shown; findings below may reference images not displayed]

FINDINGS: Lumbar vertebral bodies appear intact and aligned with maintenance
of lumbar lordosis. Severe L3-4 and L4-5 disc height loss, endplate
sclerosis and vacuum phenomenon, moderate to severe at the remaining
lumbar levels consistent with degenerative discs. No destructive
bony lesions. No pars interarticularis defects though, limited
assessment on the LEFT due to incomplete obliquity. Mild broad upper
lumbar levoscoliosis. Moderate L5-S1 facet arthropathy.

Phleboliths within the pelvis. RIGHT hip total arthroplasty with
partially imaged suspected osteolysis. Surgical clips in the pelvis.
IMPRESSION: No acute fracture deformity or malalignment. Degenerative change of
the lumbar spine.

Status post RIGHT hip total arthroplasty with suspected osteolysis,
recommend correlation with prior imaging.

  By: Nela Sandate

## 2016-02-20 MED ORDER — TRAMADOL HCL 50 MG PO TABS: 50.0000 mg | ORAL_TABLET | Freq: Three times a day (TID) | ORAL | 0 refills | Status: DC | PRN

## 2016-02-20 NOTE — Assessment & Plan Note (Signed)
Lipid panel today

## 2016-02-20 NOTE — Progress Notes (Signed)
Subjective:  Patient ID: Tamara Burton, female    DOB: Feb 21, 1934  Age: 82 y.o. MRN: [id number]  CC: Follow up  HPI:  82 year old female with an extensive PMH including renovascular HTN, Heart lipidemia, history of lung cancer status post lobectomy, carotid stenosis presents for follow up.  HTN  Well controlled this time on Coreg, hydralazine, losartan/HCTZ.  HLD  Recently well controlled for age. Requests lipid panel today.  Hx of lung cancer  Patient states that she had received regular chest x-rays for an extended period of time following her bout of lung cancer. She would like to discuss the merits of repeating a chest x-ray today.  Back pain/hip pain  Patient reports chronic back and right hip pain.  She is currently taking Aleve and Tylenol.  She reports that the pain is quite severe at times.  She is unsure if she can take anything else for this. She would like to discuss this.  Social Hx   Social History   Social History  . Marital status: Widowed    Spouse name: N/A  . Number of children: N/A  . Years of education: N/A   Social History Main Topics  . Smoking status: Never Smoker  . Smokeless tobacco: Never Used  . Alcohol use Yes     Comment: 04/24/2014 "glass of beer or wine q couple weeks"  . Drug use: No  . Sexual activity: Not Asked     Comment: Father and husband both smokers   Other Topics Concern  . None   Social History Narrative   Lives in [place]. Daughter nearby.    Review of Systems  Constitutional: Negative.   HENT: Positive for postnasal drip.   Respiratory: Negative.   Musculoskeletal: Positive for arthralgias and back pain.   Objective:  BP (!) 115/57 (BP Location: Left Arm, Patient Position: Sitting, Cuff Size: Normal)   Pulse (!) 58   Temp 97.7 F (36.5 C) (Oral)   Resp 14   Ht 5' (1.524 m)   Wt 127 lb 8 oz (57.8 kg)   LMP  (LMP Unknown)   SpO2 97%   BMI 24.90 kg/m   BP/Weight 02/20/2016 12/25/2015 08/08/3974    Systolic BP 734 193 790  Diastolic BP 57 62 53  Wt. (Lbs) 127.5 127.75 123  BMI 24.9 24.95 23.25    Physical Exam  Constitutional: She is oriented to person, place, and time. She appears well-developed. No distress.  Cardiovascular: Normal rate and regular rhythm.   Pulmonary/Chest: Effort normal. She has no wheezes. She has no rales.  Musculoskeletal:  Right hip - mild tenderness at the greater trochanter.  Neurological: She is alert and oriented to person, place, and time.  Psychiatric: She has a normal mood and affect.  Vitals reviewed.   Lab Results  Component Value Date   WBC 4.9 10/25/2015   HGB 13.1 10/25/2015   HCT 37.6 10/25/2015   PLT 210 10/25/2015   GLUCOSE 100 (H) 10/30/2015   CHOL 186 02/19/2015   TRIG 76.0 02/19/2015   HDL 68.90 02/19/2015   LDLDIRECT 201.7 02/05/2013   LDLCALC 101 (H) 02/19/2015   ALT 18 10/25/2015   AST 25 10/25/2015   NA 135 10/30/2015   K 4.1 10/30/2015   CL 99 10/30/2015   CREATININE 0.85 10/30/2015   BUN 17 10/30/2015   CO2 29 10/30/2015   TSH 0.51 09/18/2015   INR 0.95 05/01/2014   HGBA1C 5.6 04/03/2014    Assessment & Plan:  Problem List Items Addressed This Visit    Renovascular hypertension - Primary    Well controlled at this time. Continue Coreg, hydralazine, losartan/HCTZ.      Hyperlipidemia with target LDL less than 100    Lipid panel today.      Relevant Orders   Lipid panel   History of lung cancer    Patient has approximately 10 years status post lobectomy. She's had a CT in 2016 which was unremarkable. No compelling indication to continue with imaging.      Back pain    Established problem, worsening. Associated right hip pain. Trial of tramadol.      Relevant Medications   traMADol (ULTRAM) 50 MG tablet    Other Visit Diagnoses    History of anemia       Relevant Orders   CBC      Meds ordered this encounter  Medications  . traMADol (ULTRAM) 50 MG tablet    Sig: Take 1 tablet (50 mg  total) by mouth every 8 (eight) hours as needed.    Dispense:  30 tablet    Refill:  0    Follow-up: 6 months.  [place]

## 2016-02-20 NOTE — Assessment & Plan Note (Signed)
Established problem, worsening. Associated right hip pain. Trial of tramadol.

## 2016-02-20 NOTE — Assessment & Plan Note (Signed)
Well controlled at this time. Continue Coreg, hydralazine, losartan/HCTZ.

## 2016-02-20 NOTE — Assessment & Plan Note (Signed)
Patient has approximately 10 years status post lobectomy. She's had a CT in 2016 which was unremarkable. No compelling indication to continue with imaging.

## 2016-02-20 NOTE — Patient Instructions (Addendum)
Try the tramadol.  Continue the rest of your meds.  We will call with your lab results.  Follow up in 6 months.  Take care  Dr. Lacinda Axon

## 2016-02-23 ENCOUNTER — Other Ambulatory Visit: Payer: Self-pay | Admitting: Family Medicine

## 2016-02-23 MED ORDER — TRAMADOL HCL 50 MG PO TABS
50.0000 mg | ORAL_TABLET | Freq: Three times a day (TID) | ORAL | 0 refills | Status: DC | PRN
Start: 2016-02-23 — End: 2016-04-13

## 2016-02-23 NOTE — Telephone Encounter (Signed)
Refilled 02/20/16. Pt last seen same day. Pt asking for a 90-day sent to [facility] states it is working well.

## 2016-02-24 ENCOUNTER — Other Ambulatory Visit: Payer: Self-pay | Admitting: Family Medicine

## 2016-02-24 MED ORDER — EZETIMIBE 10 MG PO TABS: 10.0000 mg | ORAL_TABLET | Freq: Every day | ORAL | 3 refills | Status: DC

## 2016-02-24 NOTE — Telephone Encounter (Signed)
faxed

## 2016-04-13 ENCOUNTER — Other Ambulatory Visit: Payer: Self-pay | Admitting: *Deleted

## 2016-04-13 MED ORDER — TRAMADOL HCL 50 MG PO TABS: 50.0000 mg | ORAL_TABLET | Freq: Three times a day (TID) | ORAL | 0 refills | Status: DC | PRN

## 2016-04-13 NOTE — Telephone Encounter (Signed)
Refilled 02/23/16. Pt last seen 02/20/16. Please advise pts phone call note.

## 2016-04-13 NOTE — Telephone Encounter (Signed)
Patient has requested to have a Rx refill for Tramadol Patient will now use [facility] pharmacy for the year  Fax [phone] Contact [phone]

## 2016-04-13 NOTE — Telephone Encounter (Signed)
faxed

## 2016-04-26 DIAGNOSIS — R69 Illness, unspecified: Secondary | ICD-10-CM | POA: Diagnosis not present

## 2016-04-28 ENCOUNTER — Telehealth: Payer: Self-pay | Admitting: Family Medicine

## 2016-04-28 MED ORDER — EZETIMIBE 10 MG PO TABS: 10.0000 mg | ORAL_TABLET | Freq: Every day | ORAL | 2 refills | Status: DC

## 2016-04-28 NOTE — Telephone Encounter (Signed)
Pt asked that you please call once completed.   Call pt @ [phone]

## 2016-04-28 NOTE — Telephone Encounter (Signed)
Pt was called and told rx was sent corrected.

## 2016-04-28 NOTE — Telephone Encounter (Signed)
Pt called and stated that her tramadol was sent to the wrong mail away pharmacy. She also needs a refill on ezetimibe (ZETIA) 10 MG tablet. Please advise, thank you!  [facility]  Phone [phone]

## 2016-05-03 DIAGNOSIS — Z961 Presence of intraocular lens: Secondary | ICD-10-CM | POA: Diagnosis not present

## 2016-05-03 DIAGNOSIS — Z0101 Encounter for examination of eyes and vision with abnormal findings: Secondary | ICD-10-CM | POA: Diagnosis not present

## 2016-06-03 ENCOUNTER — Other Ambulatory Visit: Payer: Self-pay

## 2016-06-03 MED ORDER — CARVEDILOL 12.5 MG PO TABS: 12.5000 mg | ORAL_TABLET | Freq: Two times a day (BID) | ORAL | 3 refills | Status: DC

## 2016-06-03 MED ORDER — LOSARTAN POTASSIUM-HCTZ 100-25 MG PO TABS: 1.0000 | ORAL_TABLET | Freq: Every day | ORAL | 3 refills | Status: DC

## 2016-07-01 ENCOUNTER — Ambulatory Visit (INDEPENDENT_AMBULATORY_CARE_PROVIDER_SITE_OTHER): Payer: Medicare HMO | Admitting: Cardiovascular Disease

## 2016-07-01 ENCOUNTER — Encounter: Payer: Self-pay | Admitting: Cardiovascular Disease

## 2016-07-01 VITALS — BP 152/80 | HR 73 | Ht 60.0 in | Wt 125.8 lb

## 2016-07-01 DIAGNOSIS — I701 Atherosclerosis of renal artery: Secondary | ICD-10-CM

## 2016-07-01 DIAGNOSIS — I10 Essential (primary) hypertension: Secondary | ICD-10-CM | POA: Diagnosis not present

## 2016-07-01 DIAGNOSIS — E785 Hyperlipidemia, unspecified: Secondary | ICD-10-CM

## 2016-07-01 NOTE — Progress Notes (Signed)
Cardiology Office Note   Date:  07/01/2016   ID:  Tamara Burton, DOB [birth year], MRN [id number]  PCP:  Coral Spikes, DO  Cardiologist:   Kathlyn Sacramento, MD   Chief Complaint  Patient presents with  . other    6 month follow up. Meds reviewed by the pt. verbally. Pt. c/o left side chest pain.       History of Present Illness: Tamara Burton is a 83 y.o. female who presents for a follow-up visit regarding hypertension and renal artery stenosis status post left renal artery stenting in 04/2014 . Cardiac cath was done in January of 2016 due to chest pain and shortness of breath showed no significant coronary artery disease with normal left ventricular end-diastolic pressure.  She has known history of labile hypertension which typically correlates with anxiety.  She was supposed to be on carvedilol 12.5 mg twice daily.However, she continued to complain of fatigue and she decreased the dose to 6.25 mg twice daily. She has been doing reasonably well overall. Blood pressure continues to fluctuate with occasional high readings and she takes hydralazine when that happens. Occasional brief left-sided chest pain but not exertional. No significant dyspnea.    Past Medical History:  Diagnosis Date  . Arthritis    "qwhere"  . Carotid artery disease ([place])   . Chronic lower back pain   . Headache    "lately; related to HTN" (04/24/2014)  . Hyperlipidemia   . Hypertension   . Lung cancer ([place]) 2003   s/p left lower lobe resection  . Non-obstructive CAD    a. 04/2014 Cath: LM nl, LAD/D1/LCX/RCA min irregs, RPDA/PAV nl.  . Orthostatic hypotension   . OSA on CPAP    "wear it intermittently"  . PONV (postoperative nausea and vomiting)    "when I was younger"  . Renal artery stenosis ([place])    a. 04/2014 Renal angiography: LRA 95p, RRA 40ost. PTA of LRA deferred 2/2 guide cath induced Ao dissection;  01/27 6 x 15 mm Herculink stent to the L-RA   . Sinus headache     Past Surgical History:   Procedure Laterality Date  . APPENDECTOMY    . BILATERAL OOPHORECTOMY Bilateral 2000's  . CARDIAC CATHETERIZATION  04/24/2014  . CATARACT EXTRACTION W/ INTRAOCULAR LENS  IMPLANT, BILATERAL Bilateral   . JOINT REPLACEMENT    . LEFT HEART CATHETERIZATION WITH CORONARY ANGIOGRAM N/A 04/24/2014   Procedure: LEFT HEART CATHETERIZATION WITH CORONARY ANGIOGRAM;  Surgeon: Wellington Hampshire, MD;  Location: [facility] C[facility];  Service: Cardiovascular;  Laterality: N/A;  . LUNG LOBECTOMY Left 2003   "lower lobe"  . PERIPHERAL VASCULAR CATHETERIZATION  05/01/2014   Procedure: RENAL INTERVENTION;  Surgeon: Wellington Hampshire, MD; 6 x 15 mm Herculink stent to the Left Renal Artery  . RENAL ANGIOGRAM  04/24/2014  . RENAL ANGIOGRAM N/A 04/24/2014   Procedure: RENAL ANGIOGRAM;  Surgeon: Wellington Hampshire, MD;  Location: [facility];  Service: Cardiovascular;  Laterality: N/A;  . RENAL ANGIOGRAM Left 05/01/2014   Procedure: RENAL ANGIOGRAM;  Surgeon: Wellington Hampshire, MD;  Location: [facility];  Service: Cardiovascular;  Laterality: Left;  . REPLACEMENT TOTAL KNEE Left   . TOTAL HIP ARTHROPLASTY Right   . TUBAL LIGATION  ~ 1975  . VAGINAL DELIVERY  X 2  . VAGINAL HYSTERECTOMY  1980's     Current Outpatient Prescriptions  Medication Sig Dispense Refill  . albuterol (PROVENTIL HFA;VENTOLIN HFA) 108 (90 Base) MCG/ACT inhaler Inhale 2  puffs into the lungs every 6 (six) hours as needed for wheezing or shortness of breath. 1 Inhaler 0  . Ascorbic Acid (VITAMIN C CR) 1500 MG TBCR Take by mouth daily.    Marland Kitchen aspirin 81 MG tablet Take 81 mg by mouth daily.    . B Complex Vitamins (VITAMIN B COMPLEX PO) Take by mouth.    . Calcium-Vitamin D 600-200 MG-UNIT per tablet Take by mouth.    . carvedilol (COREG) 12.5 MG tablet Take 1 tablet (12.5 mg total) by mouth 2 (two) times daily with a meal. (Patient taking differently: Take 0.5 mg by mouth 2 (two) times daily with a meal. ) 180 tablet 3  . Cholecalciferol 2000 UNITS TABS Take  1 tablet by mouth daily.    Marland Kitchen ezetimibe (ZETIA) 10 MG tablet Take 1 tablet (10 mg total) by mouth daily. 90 tablet 2  . fexofenadine (ALLEGRA) 180 MG tablet Take by mouth. Reported on 07/10/2015    . Fish Oil OIL by Does not apply route.    . fluticasone (FLONASE) 50 MCG/ACT nasal spray Place 1 spray into both nostrils daily as needed. 16 g 2  . hydrALAZINE (APRESOLINE) 25 MG tablet Take 1 tablet (25 mg total) by mouth 3 (three) times daily. (Patient taking differently: Take 25 mg by mouth as needed. ) 90 tablet 6  . losartan-hydrochlorothiazide (HYZAAR) 100-25 MG tablet Take 1 tablet by mouth daily. 90 tablet 3  . Lysine HCl 500 MG CAPS Take 1,000 mg by mouth daily as needed (for cold sores).     . niacin (NIASPAN) 1000 MG CR tablet Take 1,000 mg by mouth at bedtime.    . Omega-3 Fatty Acids (FISH OIL) 1200 MG CAPS Take by mouth.    . pantoprazole (PROTONIX) 40 MG tablet Take 1 tablet (40 mg total) by mouth daily. 90 tablet 3  . traMADol (ULTRAM) 50 MG tablet Take 1 tablet (50 mg total) by mouth every 8 (eight) hours as needed. 90 tablet 0  . traZODone (DESYREL) 50 MG tablet Take 0.5-1 tablets (25-50 mg total) by mouth at bedtime as needed for sleep. 90 tablet 0  . Zinc 50 MG TABS Take by mouth daily.     No current facility-administered medications for this visit.     Allergies:   Atorvastatin; Penicillins; Sulfa antibiotics; and Amlodipine    Social History:  The patient  reports that she has never smoked. She has never used smokeless tobacco. She reports that she drinks alcohol. She reports that she does not use drugs.   Family History:  The patient's family history includes Cancer in her paternal grandmother; Parkinsonism in her mother; Sarcoidosis in her brother; Stroke in her maternal grandmother.    ROS:  Please see the history of present illness.   Otherwise, review of systems are positive for none.   All other systems are reviewed and negative.    PHYSICAL EXAM: VS:  BP (!)  152/80 (BP Location: Left Arm, Patient Position: Sitting, Cuff Size: Normal)   Pulse 73   Ht 5' (1.524 m)   Wt 125 lb 12 oz (57 kg)   LMP  (LMP Unknown)   BMI 24.56 kg/m  , BMI Body mass index is 24.56 kg/m. GEN: Well nourished, well developed, in no acute distress  HEENT: normal  Neck: no JVD, carotid bruits, or masses Cardiac: RRR; no murmurs, rubs, or gallops,no edema  Respiratory:  clear to auscultation bilaterally, normal work of breathing GI: soft, nontender, nondistended, + BS  MS: no deformity or atrophy  Skin: warm and dry, no rash Neuro:  Strength and sensation are intact Psych: euthymic mood, full affect. She appears to be anxious.   EKG:  EKG is  ordered today. EKG showed normal sinus rhythm with nonspecific ST changes.   Recent Labs: 09/18/2015: TSH 0.51 10/25/2015: ALT 18 10/30/2015: BUN 17; Creatinine, Ser 0.85; Potassium 4.1; Sodium 135 02/20/2016: Hemoglobin 12.7; Platelets 224.0    Lipid Panel    Component Value Date/Time   CHOL 270 (H) 02/20/2016 0952   CHOL 177 04/03/2014 0400   TRIG 105.0 02/20/2016 0952   TRIG 138 04/03/2014 0400   HDL 64.40 02/20/2016 0952   HDL 62 (H) 04/03/2014 0400   CHOLHDL 4 02/20/2016 0952   VLDL 21.0 02/20/2016 0952   VLDL 28 04/03/2014 0400   LDLCALC 185 (H) 02/20/2016 0952   LDLCALC 87 04/03/2014 0400   LDLDIRECT 201.7 02/05/2013 1050      Wt Readings from Last 3 Encounters:  07/01/16 125 lb 12 oz (57 kg)  02/20/16 127 lb 8 oz (57.8 kg)  12/25/15 127 lb 12 oz (57.9 kg)        ASSESSMENT AND PLAN:  1.   Status post Left renal artery stent placement for renovascular hypertension: Renal artery duplex in November 2017 showed patent stent. Repeat renal artery duplex in November 2018.  2. Essential hypertension:  Blood pressure is overall reasonably controlled with occasional fluctuations. She has known history of poor tolerance to multiple antihypertensive medications. Continue same medications for now. She can  continue to take an extra half tablet of carvedilol or hydralazine for high blood pressure readings.  3. Hyperlipidemia:  She is intolerant to statins. She was recently started on Zetia and she has been tolerating this.  Disposition:   FU with me in 6  months  Signed,  Kathlyn Sacramento, MD  07/01/2016 10:42 AM    [place]

## 2016-07-01 NOTE — Patient Instructions (Signed)
Medication Instructions: Continue same medications.   Labwork: None.   Procedures/Testing: None.   Follow-Up: 6 months with Dr. Arida.   Any Additional Special Instructions Will Be Listed Below (If Applicable).     If you need a refill on your cardiac medications before your next appointment, please call your pharmacy.   

## 2016-07-13 ENCOUNTER — Other Ambulatory Visit: Payer: Self-pay | Admitting: Family Medicine

## 2016-07-13 MED ORDER — TRAMADOL HCL 50 MG PO TABS: 50.0000 mg | ORAL_TABLET | Freq: Three times a day (TID) | ORAL | 0 refills | Status: DC | PRN

## 2016-07-13 NOTE — Telephone Encounter (Signed)
Refilled: 04/2016 Last OV: 02/2016 Last Labs: 02/20/16 Future OV: none Please advise?

## 2016-07-13 NOTE — Telephone Encounter (Signed)
Pt called about needing a refill for traMADol (ULTRAM) 50 MG tablet 90 day supply.  The cholesterol medication of ezetimibe (ZETIA) 10 MG tablet she notice that her legs are feeling weak.   Pharmacy is [facility], [place]  Call pt @ [phone]. Thank you!

## 2016-07-13 NOTE — Telephone Encounter (Signed)
faxed

## 2016-07-13 NOTE — Telephone Encounter (Signed)
Pt was called and asked about leg weakness. Pt was told that PCP did not think it was from [place].  Pt was told that if it go worse to let us know or to stop zetia.

## 2016-08-04 DIAGNOSIS — Z0000 Encounter for general adult medical examination without abnormal findings: Secondary | ICD-10-CM | POA: Diagnosis not present

## 2016-08-04 DIAGNOSIS — H9113 Presbycusis, bilateral: Secondary | ICD-10-CM | POA: Diagnosis not present

## 2016-08-04 DIAGNOSIS — Z79899 Other long term (current) drug therapy: Secondary | ICD-10-CM | POA: Diagnosis not present

## 2016-08-04 DIAGNOSIS — M47816 Spondylosis without myelopathy or radiculopathy, lumbar region: Secondary | ICD-10-CM | POA: Diagnosis not present

## 2016-08-04 DIAGNOSIS — M159 Polyosteoarthritis, unspecified: Secondary | ICD-10-CM | POA: Diagnosis not present

## 2016-08-04 DIAGNOSIS — Z959 Presence of cardiac and vascular implant and graft, unspecified: Secondary | ICD-10-CM | POA: Diagnosis not present

## 2016-08-04 DIAGNOSIS — N189 Chronic kidney disease, unspecified: Secondary | ICD-10-CM | POA: Diagnosis not present

## 2016-08-04 DIAGNOSIS — I129 Hypertensive chronic kidney disease with stage 1 through stage 4 chronic kidney disease, or unspecified chronic kidney disease: Secondary | ICD-10-CM | POA: Diagnosis not present

## 2016-08-04 DIAGNOSIS — E7800 Pure hypercholesterolemia, unspecified: Secondary | ICD-10-CM | POA: Diagnosis not present

## 2016-08-04 DIAGNOSIS — Z974 Presence of external hearing-aid: Secondary | ICD-10-CM | POA: Diagnosis not present

## 2016-10-01 ENCOUNTER — Other Ambulatory Visit: Payer: Self-pay | Admitting: Family Medicine

## 2016-10-01 MED ORDER — TRAMADOL HCL 50 MG PO TABS: 50.0000 mg | ORAL_TABLET | Freq: Three times a day (TID) | ORAL | 0 refills | Status: DC | PRN

## 2016-10-25 ENCOUNTER — Telehealth: Payer: Self-pay | Admitting: Cardiovascular Disease

## 2016-10-25 NOTE — Telephone Encounter (Signed)
Pt asks if she is due for her renal ultrasound, please advise.

## 2016-10-25 NOTE — Telephone Encounter (Signed)
She will be due for 1 yr renal u/s in November.  Ok to sched if the schedule is out that far.

## 2016-12-20 ENCOUNTER — Other Ambulatory Visit: Payer: Self-pay | Admitting: Cardiovascular Disease

## 2016-12-20 DIAGNOSIS — I701 Atherosclerosis of renal artery: Secondary | ICD-10-CM

## 2016-12-21 DIAGNOSIS — R69 Illness, unspecified: Secondary | ICD-10-CM | POA: Diagnosis not present

## 2016-12-29 DIAGNOSIS — R69 Illness, unspecified: Secondary | ICD-10-CM | POA: Diagnosis not present

## 2016-12-31 ENCOUNTER — Encounter: Payer: Self-pay | Admitting: Cardiovascular Disease

## 2016-12-31 ENCOUNTER — Ambulatory Visit (INDEPENDENT_AMBULATORY_CARE_PROVIDER_SITE_OTHER): Payer: Medicare HMO | Admitting: Cardiovascular Disease

## 2016-12-31 VITALS — BP 148/66 | HR 66 | Ht 60.0 in | Wt 122.0 lb

## 2016-12-31 DIAGNOSIS — E785 Hyperlipidemia, unspecified: Secondary | ICD-10-CM

## 2016-12-31 DIAGNOSIS — I10 Essential (primary) hypertension: Secondary | ICD-10-CM

## 2016-12-31 DIAGNOSIS — I701 Atherosclerosis of renal artery: Secondary | ICD-10-CM | POA: Diagnosis not present

## 2016-12-31 NOTE — Progress Notes (Signed)
Cardiology Office Note   Date:  12/31/2016   ID:  Tamara Burton, DOB May 15, 1933, MRN [id number]  PCP:  Coral Spikes, DO  Cardiologist:   Kathlyn Sacramento, MD   Chief Complaint  Patient presents with  . other    6 month follow up. Patient c/o back pain on left side. Meds reviewed verbally with patient.       History of Present Illness: Tamara Burton is a 83 y.o. female who presents for a follow-up visit regarding hypertension and renal artery stenosis status post left renal artery stenting in 04/2014 . Cardiac cath was done in January of 2016 due to chest pain and shortness of breath showed no significant coronary artery disease with normal left ventricular end-diastolic pressure. She has carotid disease followed by [facility]. She has known history of labile hypertension which typically correlates with anxiety.   Blood pressure has been reasonably controlled although she continues to have episodes of elevated blood pressure but usually responds to taking an extra dose of carvedilol or hydralazine. No chest pain or shortness of breath.  Past Medical History:  Diagnosis Date  . Arthritis    "qwhere"  . Carotid artery disease ([place])   . Chronic lower back pain   . Headache    "lately; related to HTN" (04/24/2014)  . Hyperlipidemia   . Hypertension   . Lung cancer ([place]) 2003   s/p left lower lobe resection  . Non-obstructive CAD    a. 04/2014 Cath: LM nl, LAD/D1/LCX/RCA min irregs, RPDA/PAV nl.  . Orthostatic hypotension   . OSA on CPAP    "wear it intermittently"  . PONV (postoperative nausea and vomiting)    "when I was younger"  . Renal artery stenosis ([place])    a. 04/2014 Renal angiography: LRA 95p, RRA 40ost. PTA of LRA deferred 2/2 guide cath induced Ao dissection;  01/27 6 x 15 mm Herculink stent to the L-RA   . Sinus headache     Past Surgical History:  Procedure Laterality Date  . APPENDECTOMY    . BILATERAL OOPHORECTOMY Bilateral 2000's  . CARDIAC CATHETERIZATION   04/24/2014  . CATARACT EXTRACTION W/ INTRAOCULAR LENS  IMPLANT, BILATERAL Bilateral   . JOINT REPLACEMENT    . LEFT HEART CATHETERIZATION WITH CORONARY ANGIOGRAM N/A 04/24/2014   Procedure: LEFT HEART CATHETERIZATION WITH CORONARY ANGIOGRAM;  Surgeon: Wellington Hampshire, MD;  Location: [facility] C[facility];  Service: Cardiovascular;  Laterality: N/A;  . LUNG LOBECTOMY Left 2003   "lower lobe"  . PERIPHERAL VASCULAR CATHETERIZATION  05/01/2014   Procedure: RENAL INTERVENTION;  Surgeon: Wellington Hampshire, MD; 6 x 15 mm Herculink stent to the Left Renal Artery  . RENAL ANGIOGRAM  04/24/2014  . RENAL ANGIOGRAM N/A 04/24/2014   Procedure: RENAL ANGIOGRAM;  Surgeon: Wellington Hampshire, MD;  Location: [facility] CATH LAB;  Service: Cardiovascular;  Laterality: N/A;  . RENAL ANGIOGRAM Left 05/01/2014   Procedure: RENAL ANGIOGRAM;  Surgeon: Wellington Hampshire, MD;  Location: [facility] CATH LAB;  Service: Cardiovascular;  Laterality: Left;  . REPLACEMENT TOTAL KNEE Left   . TOTAL HIP ARTHROPLASTY Right   . TUBAL LIGATION  ~ 1975  . VAGINAL DELIVERY  X 2  . VAGINAL HYSTERECTOMY  1980's     Current Outpatient Prescriptions  Medication Sig Dispense Refill  . albuterol (PROVENTIL HFA;VENTOLIN HFA) 108 (90 Base) MCG/ACT inhaler Inhale 2 puffs into the lungs every 6 (six) hours as needed for wheezing or shortness of breath. 1 Inhaler 0  .  Ascorbic Acid (VITAMIN C CR) 1500 MG TBCR Take by mouth daily.    Marland Kitchen aspirin 81 MG tablet Take 81 mg by mouth daily.    . B Complex Vitamins (VITAMIN B COMPLEX PO) Take by mouth.    . Calcium-Vitamin D 600-200 MG-UNIT per tablet Take by mouth.    . carvedilol (COREG) 12.5 MG tablet Take 1 tablet (12.5 mg total) by mouth 2 (two) times daily with a meal. (Patient taking differently: Take 0.5 mg by mouth 2 (two) times daily with a meal. ) 180 tablet 3  . Cholecalciferol 2000 UNITS TABS Take 1 tablet by mouth daily.    Marland Kitchen ezetimibe (ZETIA) 10 MG tablet Take 1 tablet (10 mg total) by mouth daily. 90 tablet 2    . fexofenadine (ALLEGRA) 180 MG tablet Take by mouth. Reported on 07/10/2015    . Fish Oil OIL by Does not apply route.    . fluticasone (FLONASE) 50 MCG/ACT nasal spray Place 1 spray into both nostrils daily as needed. 16 g 2  . hydrALAZINE (APRESOLINE) 25 MG tablet Take 1 tablet (25 mg total) by mouth 3 (three) times daily. (Patient taking differently: Take 25 mg by mouth as needed. ) 90 tablet 6  . losartan-hydrochlorothiazide (HYZAAR) 100-25 MG tablet Take 1 tablet by mouth daily. 90 tablet 3  . Lysine HCl 500 MG CAPS Take 1,000 mg by mouth daily as needed (for cold sores).     . niacin (NIASPAN) 1000 MG CR tablet Take 1,000 mg by mouth at bedtime.    . Omega-3 Fatty Acids (FISH OIL) 1200 MG CAPS Take by mouth.    . pantoprazole (PROTONIX) 40 MG tablet Take 1 tablet (40 mg total) by mouth daily. 90 tablet 3  . traMADol (ULTRAM) 50 MG tablet Take 1 tablet (50 mg total) by mouth every 8 (eight) hours as needed. 90 tablet 0  . traZODone (DESYREL) 50 MG tablet Take 0.5-1 tablets (25-50 mg total) by mouth at bedtime as needed for sleep. 90 tablet 0  . Zinc 50 MG TABS Take by mouth daily.     No current facility-administered medications for this visit.     Allergies:   Atorvastatin; Penicillins; Sulfa antibiotics; and Amlodipine    Social History:  The patient  reports that she has never smoked. She has never used smokeless tobacco. She reports that she drinks alcohol. She reports that she does not use drugs.   Family History:  The patient's family history includes Cancer in her paternal grandmother; Parkinsonism in her mother; Sarcoidosis in her brother; Stroke in her maternal grandmother.    ROS:  Please see the history of present illness.   Otherwise, review of systems are positive for none.   All other systems are reviewed and negative.    PHYSICAL EXAM: VS:  BP (!) 148/66 (BP Location: Left Arm, Patient Position: Sitting, Cuff Size: Normal)   Pulse 66   Ht 5' (1.524 m)   Wt 122 lb  (55.3 kg)   LMP  (LMP Unknown)   BMI 23.83 kg/m  , BMI Body mass index is 23.83 kg/m. GEN: Well nourished, well developed, in no acute distress  HEENT: normal  Neck: no JVD, carotid bruits, or masses Cardiac: RRR; no murmurs, rubs, or gallops,no edema  Respiratory:  clear to auscultation bilaterally, normal work of breathing GI: soft, nontender, nondistended, + BS MS: no deformity or atrophy  Skin: warm and dry, no rash Neuro:  Strength and sensation are intact Psych: euthymic mood,  full affect. She appears to be anxious.   EKG:  EKG is  ordered today. EKG showed normal sinus rhythm with nonspecific ST changes. Possible left atrial enlargement.   Recent Labs: 02/20/2016: Hemoglobin 12.7; Platelets 224.0    Lipid Panel    Component Value Date/Time   CHOL 270 (H) 02/20/2016 0952   CHOL 177 04/03/2014 0400   TRIG 105.0 02/20/2016 0952   TRIG 138 04/03/2014 0400   HDL 64.40 02/20/2016 0952   HDL 62 (H) 04/03/2014 0400   CHOLHDL 4 02/20/2016 0952   VLDL 21.0 02/20/2016 0952   VLDL 28 04/03/2014 0400   LDLCALC 185 (H) 02/20/2016 0952   LDLCALC 87 04/03/2014 0400   LDLDIRECT 201.7 02/05/2013 1050      Wt Readings from Last 3 Encounters:  12/31/16 122 lb (55.3 kg)  07/01/16 125 lb 12 oz (57 kg)  02/20/16 127 lb 8 oz (57.8 kg)        ASSESSMENT AND PLAN:  1.   Status post Left renal artery stent placement for renovascular hypertension: Renal artery duplex in November 2017 showed patent stent. I requested a repeat renal artery extending done in November of this year.  2. Essential hypertension:   Blood pressure is reasonably controlled on current medications and usually she knows to take an extra dose of carvedilol or hydralazine 1 blood pressure is high. High blood pressure usually correlates with anxiety.  3. Hyperlipidemia:  She is intolerant to statins. She was recently started on Zetia and she has been tolerating this.  Disposition:   FU with me in 6   months  Signed,  Kathlyn Sacramento, MD  12/31/2016 10:55 AM    [place]

## 2016-12-31 NOTE — Patient Instructions (Signed)

## 2017-01-03 ENCOUNTER — Other Ambulatory Visit: Payer: Self-pay | Admitting: *Deleted

## 2017-01-03 NOTE — Telephone Encounter (Signed)
Last office visit 02/20/16, looks last written 09/30/16 Per Jessica's message will re-establish with new provider in November

## 2017-01-03 NOTE — Telephone Encounter (Signed)
Pt has requested a medication refill tramadol  Pharmacy [facility]  *pt will re establish with the New Provider in November

## 2017-01-03 NOTE — Telephone Encounter (Signed)
Patient hasn't been seen since last November. Please see how often she takes the tramadol and what she takes this for. I'll then determine whether or not a refill is appropriate. Thanks.

## 2017-01-06 MED ORDER — TRAMADOL HCL 50 MG PO TABS: 50.0000 mg | ORAL_TABLET | Freq: Three times a day (TID) | ORAL | 0 refills | Status: DC | PRN

## 2017-01-06 NOTE — Telephone Encounter (Signed)
Pt called back returned your call. Pt said please leave message if she does not answer. Thank you!

## 2017-01-06 NOTE — Telephone Encounter (Signed)
Script placed on you desk for signature, thanks

## 2017-01-06 NOTE — Telephone Encounter (Signed)
Refill given. Please place on my desk to sign. Thanks.

## 2017-01-06 NOTE — Telephone Encounter (Signed)
Tried calling patient --no answer

## 2017-01-06 NOTE — Telephone Encounter (Signed)
Patient states she is taking 1 a day, for low back pain and right hip pain. She will establish with Dr Aundra Dubin in November.

## 2017-01-11 ENCOUNTER — Other Ambulatory Visit (INDEPENDENT_AMBULATORY_CARE_PROVIDER_SITE_OTHER): Payer: Self-pay | Admitting: Vascular Surgery

## 2017-01-11 DIAGNOSIS — I6523 Occlusion and stenosis of bilateral carotid arteries: Secondary | ICD-10-CM

## 2017-01-13 ENCOUNTER — Ambulatory Visit (INDEPENDENT_AMBULATORY_CARE_PROVIDER_SITE_OTHER): Payer: Self-pay | Admitting: Vascular Surgery

## 2017-01-13 ENCOUNTER — Encounter (INDEPENDENT_AMBULATORY_CARE_PROVIDER_SITE_OTHER): Payer: Self-pay

## 2017-02-02 ENCOUNTER — Ambulatory Visit (INDEPENDENT_AMBULATORY_CARE_PROVIDER_SITE_OTHER): Payer: Medicare HMO | Admitting: Vascular Surgery

## 2017-02-02 ENCOUNTER — Ambulatory Visit (INDEPENDENT_AMBULATORY_CARE_PROVIDER_SITE_OTHER): Payer: Medicare HMO

## 2017-02-02 ENCOUNTER — Encounter (INDEPENDENT_AMBULATORY_CARE_PROVIDER_SITE_OTHER): Payer: Self-pay | Admitting: Vascular Surgery

## 2017-02-02 VITALS — BP 159/75 | HR 57 | Resp 15 | Ht 61.0 in | Wt 124.0 lb

## 2017-02-02 DIAGNOSIS — I6523 Occlusion and stenosis of bilateral carotid arteries: Secondary | ICD-10-CM

## 2017-02-02 DIAGNOSIS — I701 Atherosclerosis of renal artery: Secondary | ICD-10-CM

## 2017-02-02 DIAGNOSIS — E785 Hyperlipidemia, unspecified: Secondary | ICD-10-CM | POA: Diagnosis not present

## 2017-02-02 NOTE — Progress Notes (Signed)
Subjective:    Patient ID: Tamara Burton, female    DOB: 1933-10-05, 83 y.o.   MRN: [id number] Chief Complaint  Patient presents with  . Carotid    1 year follow up   Patient presents for a yearly non-invasive study follow up for carotid stenosis. The stenosis has been followed by surveillance duplexes. The patient underwent a bilateral carotid duplex scan which showed no change from the previous exam on 01/05/2016. Duplex is stable at 40-59% ICA stenosis bilaterally. The patient denies experiencing Amaurosis Fugax, TIA like symptoms or focal motor deficits. Patient denies any fever, nausea vomiting.   Review of Systems  Constitutional: Negative.   HENT: Negative.   Eyes: Negative.   Respiratory: Negative.   Cardiovascular: Negative.   Gastrointestinal: Negative.   Endocrine: Negative.   Genitourinary: Negative.   Musculoskeletal: Negative.   Skin: Negative.   Allergic/Immunologic: Negative.   Neurological: Negative.   Hematological: Negative.   Psychiatric/Behavioral: Negative.       Objective:   Physical Exam  Constitutional: She is oriented to person, place, and time. She appears well-developed and well-nourished. No distress.  HENT:  Head: Normocephalic and atraumatic.  Eyes: Pupils are equal, round, and reactive to light. Conjunctivae are normal.  Neck: Normal range of motion.  No carotid bruits are noted on exam  Cardiovascular: Normal rate, regular rhythm, normal heart sounds and intact distal pulses.   Pulses:      Radial pulses are 2+ on the right side, and 2+ on the left side.  Pulmonary/Chest: Effort normal.  Musculoskeletal: Normal range of motion. She exhibits no edema.  Neurological: She is alert and oriented to person, place, and time.  Skin: Skin is warm and dry. She is not diaphoretic.  Psychiatric: She has a normal mood and affect. Her behavior is normal. Judgment and thought content normal.  Vitals reviewed.  BP (!) 159/75 (BP Location: Right Arm)    Pulse (!) 57   Resp 15   Ht 5\' 1"  (1.549 m)   Wt 124 lb (56.2 kg)   LMP  (LMP Unknown)   BMI 23.43 kg/m   Past Medical History:  Diagnosis Date  . Arthritis    "qwhere"  . Carotid artery disease ([place])   . Chronic lower back pain   . Headache    "lately; related to HTN" (04/24/2014)  . Hyperlipidemia   . Hypertension   . Lung cancer ([place]) 2003   s/p left lower lobe resection  . Non-obstructive CAD    a. 04/2014 Cath: LM nl, LAD/D1/LCX/RCA min irregs, RPDA/PAV nl.  . Orthostatic hypotension   . OSA on CPAP    "wear it intermittently"  . PONV (postoperative nausea and vomiting)    "when I was younger"  . Renal artery stenosis ([place])    a. 04/2014 Renal angiography: LRA 95p, RRA 40ost. PTA of LRA deferred 2/2 guide cath induced Ao dissection;  01/27 6 x 15 mm Herculink stent to the L-RA   . Sinus headache    Social History   Social History  . Marital status: Widowed    Spouse name: N/A  . Number of children: N/A  . Years of education: N/A   Occupational History  . Not on file.   Social History Main Topics  . Smoking status: Never Smoker  . Smokeless tobacco: Never Used  . Alcohol use Yes     Comment: 04/24/2014 "glass of beer or wine q couple weeks"  . Drug use: No  . Sexual activity:  Not on file     Comment: Father and husband both smokers   Other Topics Concern  . Not on file   Social History Narrative   Lives in [place]. Daughter nearby.   Past Surgical History:  Procedure Laterality Date  . APPENDECTOMY    . BILATERAL OOPHORECTOMY Bilateral 2000's  . CARDIAC CATHETERIZATION  04/24/2014  . CATARACT EXTRACTION W/ INTRAOCULAR LENS  IMPLANT, BILATERAL Bilateral   . JOINT REPLACEMENT    . LEFT HEART CATHETERIZATION WITH CORONARY ANGIOGRAM N/A 04/24/2014   Procedure: LEFT HEART CATHETERIZATION WITH CORONARY ANGIOGRAM;  Surgeon: Wellington Hampshire, MD;  Location: [facility] CATH [facility];  Service: Cardiovascular;  Laterality: N/A;  . LUNG LOBECTOMY Left 2003   "lower lobe"   . PERIPHERAL VASCULAR CATHETERIZATION  05/01/2014   Procedure: RENAL INTERVENTION;  Surgeon: Wellington Hampshire, MD; 6 x 15 mm Herculink stent to the Left Renal Artery  . RENAL ANGIOGRAM  04/24/2014  . RENAL ANGIOGRAM N/A 04/24/2014   Procedure: RENAL ANGIOGRAM;  Surgeon: Wellington Hampshire, MD;  Location: [facility];  Service: Cardiovascular;  Laterality: N/A;  . RENAL ANGIOGRAM Left 05/01/2014   Procedure: RENAL ANGIOGRAM;  Surgeon: Wellington Hampshire, MD;  Location: [facility];  Service: Cardiovascular;  Laterality: Left;  . REPLACEMENT TOTAL KNEE Left   . TOTAL HIP ARTHROPLASTY Right   . TUBAL LIGATION  ~ 1975  . VAGINAL DELIVERY  X 2  . VAGINAL HYSTERECTOMY  1980's   Family History  Problem Relation Age of Onset  . Parkinsonism Mother   . Sarcoidosis Brother   . Stroke Maternal Grandmother   . Cancer Paternal Grandmother    Allergies  Allergen Reactions  . Atorvastatin Other (See Comments)    Muscle weakness  . Penicillins Hives  . Sulfa Antibiotics Hives  . Amlodipine     Weakness and confusion.       Assessment & Plan:  Patient presents for a yearly non-invasive study follow up for carotid stenosis. The stenosis has been followed by surveillance duplexes. The patient underwent a bilateral carotid duplex scan which showed no change from the previous exam on 01/05/2016. Duplex is stable at 40-59% ICA stenosis bilaterally. The patient denies experiencing Amaurosis Fugax, TIA like symptoms or focal motor deficits. Patient denies any fever, nausea vomiting.  1. Bilateral carotid artery stenosis - Stable Studies reviewed with patient. Patient asymptomatic with stable duplex. No intervention at this time. Patient to return in one year for surveillance carotid duplex. Patient to remain abstinent of tobacco use. I have discussed with the patient at length the risk factors for and pathogenesis of atherosclerotic disease and encouraged a healthy diet, regular exercise regimen and blood  pressure / glucose control.  Patient was instructed to contact our office in the interim with problems such as arm / leg weakness or numbness, speech / swallowing difficulty or temporary monocular blindness. The patient expresses their understanding.  - VAS US CAROTID; Future  2. Hyperlipidemia with target LDL less than 100 - Stable Encouraged good control as its slows the progression of atherosclerotic disease  3. RAS (renal artery stenosis) ([place]) - Stable Patient states her hypertension is well controlled. Patient states this is followed by Dr. Sophronia Simas  Current Outpatient Prescriptions on File Prior to Visit  Medication Sig Dispense Refill  . albuterol (PROVENTIL HFA;VENTOLIN HFA) 108 (90 Base) MCG/ACT inhaler Inhale 2 puffs into the lungs every 6 (six) hours as needed for wheezing or shortness of breath. 1 Inhaler 0  . Ascorbic  Acid (VITAMIN C CR) 1500 MG TBCR Take by mouth daily.    Marland Kitchen aspirin 81 MG tablet Take 81 mg by mouth daily.    . B Complex Vitamins (VITAMIN B COMPLEX PO) Take by mouth.    . Calcium-Vitamin D 600-200 MG-UNIT per tablet Take by mouth.    . carvedilol (COREG) 12.5 MG tablet Take 1 tablet (12.5 mg total) by mouth 2 (two) times daily with a meal. (Patient taking differently: Take 0.5 mg by mouth 2 (two) times daily with a meal. ) 180 tablet 3  . Cholecalciferol 2000 UNITS TABS Take 1 tablet by mouth daily.    Marland Kitchen ezetimibe (ZETIA) 10 MG tablet Take 1 tablet (10 mg total) by mouth daily. 90 tablet 2  . fexofenadine (ALLEGRA) 180 MG tablet Take by mouth. Reported on 07/10/2015    . Fish Oil OIL by Does not apply route.    . fluticasone (FLONASE) 50 MCG/ACT nasal spray Place 1 spray into both nostrils daily as needed. 16 g 2  . hydrALAZINE (APRESOLINE) 25 MG tablet Take 1 tablet (25 mg total) by mouth 3 (three) times daily. (Patient taking differently: Take 25 mg by mouth as needed. ) 90 tablet 6  . losartan-hydrochlorothiazide (HYZAAR) 100-25 MG tablet Take 1 tablet by mouth  daily. 90 tablet 3  . Lysine HCl 500 MG CAPS Take 1,000 mg by mouth daily as needed (for cold sores).     . niacin (NIASPAN) 1000 MG CR tablet Take 1,000 mg by mouth at bedtime.    . Omega-3 Fatty Acids (FISH OIL) 1200 MG CAPS Take by mouth.    . pantoprazole (PROTONIX) 40 MG tablet Take 1 tablet (40 mg total) by mouth daily. 90 tablet 3  . traMADol (ULTRAM) 50 MG tablet Take 1 tablet (50 mg total) by mouth every 8 (eight) hours as needed. 90 tablet 0  . traZODone (DESYREL) 50 MG tablet Take 0.5-1 tablets (25-50 mg total) by mouth at bedtime as needed for sleep. 90 tablet 0  . Zinc 50 MG TABS Take by mouth daily.     No current facility-administered medications on file prior to visit.     There are no Patient Instructions on file for this visit. No Follow-up on file.   Gradie Ohm A Gor Vestal, PA-C

## 2017-02-03 DIAGNOSIS — D229 Melanocytic nevi, unspecified: Secondary | ICD-10-CM | POA: Diagnosis not present

## 2017-02-03 DIAGNOSIS — D1800 Hemangioma unspecified site: Secondary | ICD-10-CM | POA: Diagnosis not present

## 2017-02-03 DIAGNOSIS — L812 Freckles: Secondary | ICD-10-CM | POA: Diagnosis not present

## 2017-02-03 DIAGNOSIS — L578 Other skin changes due to chronic exposure to nonionizing radiation: Secondary | ICD-10-CM | POA: Diagnosis not present

## 2017-02-03 DIAGNOSIS — L821 Other seborrheic keratosis: Secondary | ICD-10-CM | POA: Diagnosis not present

## 2017-02-03 DIAGNOSIS — L820 Inflamed seborrheic keratosis: Secondary | ICD-10-CM | POA: Diagnosis not present

## 2017-02-03 DIAGNOSIS — Z1283 Encounter for screening for malignant neoplasm of skin: Secondary | ICD-10-CM | POA: Diagnosis not present

## 2017-02-10 DIAGNOSIS — H903 Sensorineural hearing loss, bilateral: Secondary | ICD-10-CM | POA: Diagnosis not present

## 2017-02-11 ENCOUNTER — Ambulatory Visit (INDEPENDENT_AMBULATORY_CARE_PROVIDER_SITE_OTHER): Payer: Medicare HMO

## 2017-02-11 DIAGNOSIS — I701 Atherosclerosis of renal artery: Secondary | ICD-10-CM

## 2017-02-22 ENCOUNTER — Encounter: Payer: Self-pay | Admitting: Internal Medicine

## 2017-02-22 ENCOUNTER — Ambulatory Visit (INDEPENDENT_AMBULATORY_CARE_PROVIDER_SITE_OTHER): Payer: Medicare HMO

## 2017-02-22 ENCOUNTER — Other Ambulatory Visit: Payer: Self-pay

## 2017-02-22 ENCOUNTER — Ambulatory Visit (INDEPENDENT_AMBULATORY_CARE_PROVIDER_SITE_OTHER): Payer: Medicare HMO | Admitting: Internal Medicine

## 2017-02-22 VITALS — BP 144/78 | HR 65 | Temp 98.2°F | Ht 60.6 in | Wt 125.6 lb

## 2017-02-22 DIAGNOSIS — E785 Hyperlipidemia, unspecified: Secondary | ICD-10-CM | POA: Diagnosis not present

## 2017-02-22 DIAGNOSIS — R1033 Periumbilical pain: Secondary | ICD-10-CM | POA: Diagnosis not present

## 2017-02-22 DIAGNOSIS — G8929 Other chronic pain: Secondary | ICD-10-CM

## 2017-02-22 DIAGNOSIS — M545 Low back pain: Secondary | ICD-10-CM | POA: Diagnosis not present

## 2017-02-22 DIAGNOSIS — M5441 Lumbago with sciatica, right side: Secondary | ICD-10-CM

## 2017-02-22 DIAGNOSIS — Z23 Encounter for immunization: Secondary | ICD-10-CM | POA: Diagnosis not present

## 2017-02-22 DIAGNOSIS — Z0000 Encounter for general adult medical examination without abnormal findings: Secondary | ICD-10-CM

## 2017-02-22 DIAGNOSIS — R110 Nausea: Secondary | ICD-10-CM

## 2017-02-22 DIAGNOSIS — Z85118 Personal history of other malignant neoplasm of bronchus and lung: Secondary | ICD-10-CM | POA: Diagnosis not present

## 2017-02-22 DIAGNOSIS — Z1329 Encounter for screening for other suspected endocrine disorder: Secondary | ICD-10-CM | POA: Diagnosis not present

## 2017-02-22 NOTE — Patient Instructions (Signed)
1. Please follow up in 2-3 weeks  2. Come back next week for labs  3. We will order a back Xray and Ultrasound of your abdomen  Take care   Evolocumab injection/Repatha  What is this medicine? EVOLOCUMAB (e voe LOK ue mab) is known as a PCSK9 inhibitor. It is used to lower the level of cholesterol in the blood. It may be used alone or in combination with other cholesterol-lowering drugs. This drug may also be used to reduce the risk of heart attack, stroke, and certain types of heart surgery in patients with heart disease. This medicine may be used for other purposes; ask your health care provider or pharmacist if you have questions. COMMON BRAND NAME(S): REPATHA What should I tell my health care provider before I take this medicine? They need to know if you have any of these conditions: -an unusual or allergic reaction to evolocumab, other medicines, foods, dyes, or preservatives -pregnant or trying to get pregnant -breast-feeding How should I use this medicine? This medicine is for injection under the skin. You will be taught how to prepare and give this medicine. Use exactly as directed. Take your medicine at regular intervals. Do not take your medicine more often than directed. It is important that you put your used needles and syringes in a special sharps container. Do not put them in a trash can. If you do not have a sharps container, call your pharmacist or health care provider to get one. Talk to your pediatrician regarding the use of this medicine in children. While this drug may be prescribed for children as young as 13 years for selected conditions, precautions do apply. Overdosage: If you think you have taken too much of this medicine contact a poison control center or emergency room at once. NOTE: This medicine is only for you. Do not share this medicine with others. What if I miss a dose? If you miss a dose, take it as soon as you can if there are more than 7 days until the next  scheduled dose, or skip the missed dose and take the next dose according to your original schedule. Do not take double or extra doses. What may interact with this medicine? Interactions are not expected. This list may not describe all possible interactions. Give your health care provider a list of all the medicines, herbs, non-prescription drugs, or dietary supplements you use. Also tell them if you smoke, drink alcohol, or use illegal drugs. Some items may interact with your medicine. What should I watch for while using this medicine? You may need blood work while you are taking this medicine. What side effects may I notice from receiving this medicine? Side effects that you should report to your doctor or health care professional as soon as possible: -allergic reactions like skin rash, itching or hives, swelling of the face, lips, or tongue -signs and symptoms of infection like fever or chills; cough; sore throat; pain or trouble passing urine Side effects that usually do not require medical attention (report to your doctor or health care professional if they continue or are bothersome): -diarrhea -nausea -muscle pain -pain, redness, or irritation at site where injected This list may not describe all possible side effects. Call your doctor for medical advice about side effects. You may report side effects to FDA at 1-800-FDA-1088. Where should I keep my medicine? Keep out of the reach of children. You will be instructed on how to store this medicine. Throw away any unused medicine after  the expiration date on the label. NOTE: This sheet is a summary. It may not cover all possible information. If you have questions about this medicine, talk to your doctor, pharmacist, or health care provider.  2018 Elsevier/Gold Standard (2016-03-08 13:21:53) Cholesterol Cholesterol is a fat. Your body needs a small amount of cholesterol. Cholesterol (plaque) may build up in your blood vessels (arteries).  That makes you more likely to have a heart attack or stroke. You cannot feel your cholesterol level. Having a blood test is the only way to find out if your level is high. Keep your test results. Work with your doctor to keep your cholesterol at a good level. What do the results mean?  Total cholesterol is how much cholesterol is in your blood.  LDL is bad cholesterol. This is the type that can build up. Try to have low LDL.  HDL is good cholesterol. It cleans your blood vessels and carries LDL away. Try to have high HDL.  Triglycerides are fat that the body can store or burn for energy. What are good levels of cholesterol?  Total cholesterol below 200.  LDL below 100 is good for people who have health risks. LDL below 70 is good for people who have very high risks.  HDL above 40 is good. It is best to have HDL of 60 or higher.  Triglycerides below 150. How can I lower my cholesterol? Diet Follow your diet program as told by your doctor.  Choose fish, white meat chicken, or [place] that is roasted or baked. Try not to eat red meat, fried foods, sausage, or lunch meats.  Eat lots of fresh fruits and vegetables.  Choose whole grains, beans, pasta, potatoes, and cereals.  Choose olive oil, corn oil, or canola oil. Only use small amounts.  Try not to eat butter, mayonnaise, shortening, or palm kernel oils.  Try not to eat foods with trans fats.  Choose low-fat or nonfat dairy foods. ? Drink skim or nonfat milk. ? Eat low-fat or nonfat yogurt and cheeses. ? Try not to drink whole milk or cream. ? Try not to eat ice cream, egg yolks, or full-fat cheeses.  Healthy desserts include angel food cake, ginger snaps, animal crackers, hard candy, popsicles, and low-fat or nonfat frozen yogurt. Try not to eat pastries, cakes, pies, and cookies.  Exercise Follow your exercise program as told by your doctor.  Be more active. Try gardening, walking, and taking the stairs.  Ask your  doctor about ways that you can be more active.  Medicine  Take over-the-counter and prescription medicines only as told by your doctor.  This information is not intended to replace advice given to you by your health care provider. Make sure you discuss any questions you have with your health care provider. Document Released: 06/18/2008 Document Revised: 10/22/2015 Document Reviewed: 10/02/2015 Elsevier Interactive Patient Education  2017 [place].  Abdominal Pain, Adult Many things can cause belly (abdominal) pain. Most times, belly pain is not dangerous. Many cases of belly pain can be watched and treated at home. Sometimes belly pain is serious, though. Your doctor will try to find the cause of your belly pain. Follow these instructions at home:  Take over-the-counter and prescription medicines only as told by your doctor. Do not take medicines that help you poop (laxatives) unless told to by your doctor.  Drink enough fluid to keep your pee (urine) clear or pale yellow.  Watch your belly pain for any changes.  Keep all follow-up  visits as told by your doctor. This is important. Contact a doctor if:  Your belly pain changes or gets worse.  You are not hungry, or you lose weight without trying.  You are having trouble pooping (constipated) or have watery poop (diarrhea) for more than 2-3 days.  You have pain when you pee or poop.  Your belly pain wakes you up at night.  Your pain gets worse with meals, after eating, or with certain foods.  You are throwing up and cannot keep anything down.  You have a fever. Get help right away if:  Your pain does not go away as soon as your doctor says it should.  You cannot stop throwing up.  Your pain is only in areas of your belly, such as the right side or the left lower part of the belly.  You have bloody or black poop, or poop that looks like tar.  You have very bad pain, cramping, or bloating in your belly.  You have  signs of not having enough fluid or water in your body (dehydration), such as: ? Dark pee, very little pee, or no pee. ? Cracked lips. ? Dry mouth. ? Sunken eyes. ? Sleepiness. ? Weakness. This information is not intended to replace advice given to you by your health care provider. Make sure you discuss any questions you have with your health care provider. Document Released: 09/08/2007 Document Revised: 10/10/2015 Document Reviewed: 09/03/2015 Elsevier Interactive Patient Education  2017 [employer].

## 2017-02-23 ENCOUNTER — Telehealth: Payer: Self-pay | Admitting: Family Medicine

## 2017-02-23 NOTE — Telephone Encounter (Signed)
Pt called asking if Dr.Tracy wanted to order anything for her lungs since she a history of lung cancer. She said that she was not going to order an xray but another image. Pt cb [phone]

## 2017-02-23 NOTE — Telephone Encounter (Signed)
Copied from [place]. Topic: Quick Communication - See Telephone Encounter >> Feb 23, 2017  9:33 AM Carolyn Stare wrote: CRM for notification. See Telephone encounter for:     ezetimibe (ZETIA) 10 MG tablet [employer] home delivery

## 2017-02-23 NOTE — Telephone Encounter (Signed)
Copied from [place]. Topic: Quick Communication - See Telephone Encounter >> Feb 23, 2017  9:25 AM Carolyn Stare wrote: CRM for notification. See Telephone encounter for: 02/23/17.    Pt req refill ezetimibe (ZETIA) 10 MG tablet   [facility] order

## 2017-02-23 NOTE — Progress Notes (Signed)
Chief Complaint  Patient presents with  . Annual Exam   Pt presents for annual exam  1. C/o low back pain radiating down right leg max 8-9/10 ache sensation. Back "belt" helps 2. She wonders if Zetia is causing muscle aches. She prev. Had statin intolerance to lipitor. Reviewed up to date Zetia side effects <1% myalgia and arthragia 3%. She also reports h/o arthritis and bursitis which she is also wondering if could be the cause and is s/p hip replacement  3. H/o lung cancer s/p left lower lobectomy and never a smoker reports h/o CXR to follow since had lung cancer. She reports she has been in remission >15 years   Review of Systems  Constitutional: Negative for weight loss.  HENT: Positive for nosebleeds.   Respiratory: Negative for shortness of breath.   Cardiovascular: Negative for chest pain.  Gastrointestinal: Positive for abdominal pain and nausea. Negative for blood in stool.       Stools black 2/2 iron per pt   Genitourinary: Negative for hematuria.  Musculoskeletal: Positive for back pain, joint pain and myalgias.  Skin: Negative for rash.  Neurological:       Denies falls   Psychiatric/Behavioral: Negative for memory loss.   Past Medical History:  Diagnosis Date  . Arthritis    "qwhere"  . Carotid artery disease ([place])   . Chronic lower back pain   . Headache    "lately; related to HTN" (04/24/2014)  . Hyperlipidemia   . Hypertension   . Lung cancer ([place]) 2003   s/p left lower lobe resection  . Non-obstructive CAD    a. 04/2014 Cath: LM nl, LAD/D1/LCX/RCA min irregs, RPDA/PAV nl.  . Orthostatic hypotension   . OSA on CPAP    "wear it intermittently"  . PONV (postoperative nausea and vomiting)    "when I was younger"  . Renal artery stenosis ([place])    a. 04/2014 Renal angiography: LRA 95p, RRA 40ost. PTA of LRA deferred 2/2 guide cath induced Ao dissection;  01/27 6 x 15 mm Herculink stent to the L-RA   . Sinus headache    Past Surgical History:  Procedure  Laterality Date  . APPENDECTOMY    . BILATERAL OOPHORECTOMY Bilateral 2000's  . CARDIAC CATHETERIZATION  04/24/2014  . CATARACT EXTRACTION W/ INTRAOCULAR LENS  IMPLANT, BILATERAL Bilateral   . JOINT REPLACEMENT    . LEFT HEART CATHETERIZATION WITH CORONARY ANGIOGRAM N/A 04/24/2014   Procedure: LEFT HEART CATHETERIZATION WITH CORONARY ANGIOGRAM;  Surgeon: Wellington Hampshire, MD;  Location: [facility];  Service: Cardiovascular;  Laterality: N/A;  . LUNG LOBECTOMY Left 2003   "lower lobe"  . PERIPHERAL VASCULAR CATHETERIZATION  05/01/2014   Procedure: RENAL INTERVENTION;  Surgeon: Wellington Hampshire, MD; 6 x 15 mm Herculink stent to the Left Renal Artery  . RENAL ANGIOGRAM  04/24/2014  . RENAL ANGIOGRAM N/A 04/24/2014   Procedure: RENAL ANGIOGRAM;  Surgeon: Wellington Hampshire, MD;  Location: [facility];  Service: Cardiovascular;  Laterality: N/A;  . RENAL ANGIOGRAM Left 05/01/2014   Procedure: RENAL ANGIOGRAM;  Surgeon: Wellington Hampshire, MD;  Location: [facility];  Service: Cardiovascular;  Laterality: Left;  . REPLACEMENT TOTAL KNEE Left   . TOTAL HIP ARTHROPLASTY Right   . TUBAL LIGATION  ~ 1975  . VAGINAL DELIVERY  X 2  . VAGINAL HYSTERECTOMY  1980's   Family History  Problem Relation Age of Onset  . Parkinsonism Mother   . Sarcoidosis Brother   . Stroke  Maternal Grandmother   . Cancer Paternal Grandmother    Social History   Socioeconomic History  . Marital status: Widowed    Spouse name: Not on file  . Number of children: Not on file  . Years of education: Not on file  . Highest education level: Not on file  Social Needs  . Financial resource strain: Not on file  . Food insecurity - worry: Not on file  . Food insecurity - inability: Not on file  . Transportation needs - medical: Not on file  . Transportation needs - non-medical: Not on file  Occupational History  . Not on file  Tobacco Use  . Smoking status: Never Smoker  . Smokeless tobacco: Never Used  Substance and Sexual  Activity  . Alcohol use: Yes    Comment: 04/24/2014 "glass of beer or wine q couple weeks"  . Drug use: No  . Sexual activity: Not on file    Comment: Father and husband both smokers  Other Topics Concern  . Not on file  Social History Narrative   Lives in [place]. Daughter nearby.   Current Meds  Medication Sig  . albuterol (PROVENTIL HFA;VENTOLIN HFA) 108 (90 Base) MCG/ACT inhaler Inhale 2 puffs into the lungs every 6 (six) hours as needed for wheezing or shortness of breath.  . Ascorbic Acid (VITAMIN C CR) 1500 MG TBCR Take by mouth daily.  Marland Kitchen aspirin 81 MG tablet Take 81 mg by mouth daily.  . B Complex Vitamins (VITAMIN B COMPLEX PO) Take by mouth.  . Calcium-Vitamin D 600-200 MG-UNIT per tablet Take by mouth.  . carvedilol (COREG) 12.5 MG tablet Take 1 tablet (12.5 mg total) by mouth 2 (two) times daily with a meal. (Patient taking differently: Take 0.5 mg 2 (two) times daily by mouth. )  . Cholecalciferol 2000 UNITS TABS Take 1 tablet by mouth daily.  Marland Kitchen ezetimibe (ZETIA) 10 MG tablet Take 1 tablet (10 mg total) by mouth daily.  . fexofenadine (ALLEGRA) 180 MG tablet Take by mouth. Reported on 07/10/2015  . Fish Oil OIL by Does not apply route.  . fluticasone (FLONASE) 50 MCG/ACT nasal spray Place 1 spray into both nostrils daily as needed.  . hydrALAZINE (APRESOLINE) 25 MG tablet Take 1 tablet (25 mg total) by mouth 3 (three) times daily. (Patient taking differently: Take 25 mg by mouth as needed. )  . losartan-hydrochlorothiazide (HYZAAR) 100-25 MG tablet Take 1 tablet by mouth daily.  Marland Kitchen Lysine HCl 500 MG CAPS Take 1,000 mg by mouth daily as needed (for cold sores).   . magnesium 30 MG tablet Take 30 mg 2 (two) times daily by mouth.  . pantoprazole (PROTONIX) 40 MG tablet Take 1 tablet (40 mg total) by mouth daily.  . traMADol (ULTRAM) 50 MG tablet Take 1 tablet (50 mg total) by mouth every 8 (eight) hours as needed.  . traZODone (DESYREL) 50 MG tablet Take 0.5-1 tablets (25-50 mg  total) by mouth at bedtime as needed for sleep.  Marland Kitchen Zinc 50 MG TABS Take by mouth daily.   Allergies  Allergen Reactions  . Atorvastatin Other (See Comments)    Muscle weakness  . Penicillins Hives  . Sulfa Antibiotics Hives  . Amlodipine     Weakness and confusion.    Body mass index is 24.05 kg/m. Wt Readings from Last 3 Encounters:  02/22/17 125 lb 9.6 oz (57 kg)  02/02/17 124 lb (56.2 kg)  12/31/16 122 lb (55.3 kg)   Temp Readings from Last  3 Encounters:  02/22/17 98.2 F (36.8 C) (Oral)  02/20/16 97.7 F (36.5 C) (Oral)  10/25/15 98.3 F (36.8 C) (Oral)   BP Readings from Last 3 Encounters:  02/22/17 (!) 144/78  02/02/17 (!) 159/75  12/31/16 (!) 148/66   Pulse Readings from Last 3 Encounters:  02/22/17 65  02/02/17 (!) 57  12/31/16 66   @LASTSAO2 (3)@ No results found for this or any previous visit (from the past 2160 hour(s)). Objective  Physical Exam  Constitutional: She is oriented to person, place, and time and well-developed, well-nourished, and in no distress. Vital signs are normal. No distress.  HENT:  Head: Normocephalic and atraumatic.  Mouth/Throat: Oropharynx is clear and moist and mucous membranes are normal.  Eyes: Conjunctivae are normal. Pupils are equal, round, and reactive to light.  Cardiovascular: Normal rate and regular rhythm.  Neg leg edema b/l   Pulmonary/Chest: Effort normal and breath sounds normal.  Abdominal: Soft. Bowel sounds are normal. There is tenderness in the periumbilical area. There is no rebound and no guarding.  Neurological: She is alert and oriented to person, place, and time. Gait normal.  Skin: Skin is warm, dry and intact.  Psychiatric: Mood, memory, affect and judgment normal.  Nursing note and vitals reviewed.  Assessment   1. Annual wellness  2. Low back pain with right leg sciatica likely 2/2 degenerative changes previously noted   3. Periumbilical Abdominal pain and nausea intermittently  4. HLD  5. HM   6. H/o lung cancer never smoker h/o 2nd hand exposure   Plan  1. Labs to return when fasting  No h/o falls recently adls wnl  Pap and mammo out of age windonw Colonoscopy 05/2007 with h/o tubular and hyperplastic polyps out of age window consider cologuard until age 67  DEXA    Cont ca and vit D   2.Xray low back  Prn tylenol  If worsening MRI low back  Tramadol also helps  3.  Return if worsening on PPI, continue to monitor  Will order US abdomen to w/u  4.check lipid next week when fasting Disc repatha today with h/o abnormal LDL    5.  Given pna 23 today  Had flu shot 12/2016  prevnar utd Tdap had 10/05/12  Had zostervax disc need for shingrix in future   See other HM #1  6. Disc CT chest better than CXR to monitor for recurrence   Provider: Dr. Olivia Mackie McLean-Scocuzza

## 2017-02-24 ENCOUNTER — Encounter: Payer: Self-pay | Admitting: Internal Medicine

## 2017-02-24 DIAGNOSIS — R1033 Periumbilical pain: Secondary | ICD-10-CM | POA: Insufficient documentation

## 2017-02-28 ENCOUNTER — Ambulatory Visit
Admission: RE | Admit: 2017-02-28 | Discharge: 2017-02-28 | Disposition: A | Discharge status: Home / Self Care | Payer: Medicare HMO | Source: Ambulatory Visit | Attending: Internal Medicine | Admitting: Internal Medicine

## 2017-02-28 DIAGNOSIS — N200 Calculus of kidney: Secondary | ICD-10-CM | POA: Diagnosis not present

## 2017-02-28 DIAGNOSIS — R1033 Periumbilical pain: Secondary | ICD-10-CM | POA: Diagnosis not present

## 2017-03-07 ENCOUNTER — Telehealth: Payer: Self-pay | Admitting: Internal Medicine

## 2017-03-07 ENCOUNTER — Other Ambulatory Visit: Payer: Self-pay | Admitting: Internal Medicine

## 2017-03-07 ENCOUNTER — Other Ambulatory Visit: Payer: Self-pay | Admitting: Family Medicine

## 2017-03-07 DIAGNOSIS — E785 Hyperlipidemia, unspecified: Secondary | ICD-10-CM

## 2017-03-07 MED ORDER — EZETIMIBE 10 MG PO TABS: 10.0000 mg | ORAL_TABLET | Freq: Every day | ORAL | 1 refills | Status: DC

## 2017-03-07 NOTE — Telephone Encounter (Signed)
Ok to refill 

## 2017-03-07 NOTE — Telephone Encounter (Signed)
° °  Relation to pt: self Call back number:[phone]  Pharmacy: [facility], [place] [phone] (Phone) [phone] (Fax)     Reason for call:  Patient calling checking on the status of 02/23/17 ezetimibe (ZETIA) 10 MG tablet medication refill, please advise

## 2017-03-07 NOTE — Telephone Encounter (Signed)
Sent Rx  Please advise pt like I did would consider repatha in future LDL being 185 and total cholesterol elevated  Cardiology consult would be needed in this case.  Is she interested?  Thanks [employer]

## 2017-03-07 NOTE — Telephone Encounter (Signed)
Copied from [employer]. Topic: Quick Communication - See Telephone Encounter >> Feb 23, 2017  9:33 AM Carolyn Stare wrote: CRM for notification. See Telephone encounter for:     ezetimibe (ZETIA) 10 MG tablet [employer] home delivery   >> Mar 07, 2017  4:11 PM Oneta Rack wrote:  Relation to pt: self Call back number:[phone]  Pharmacy: [facility], [place] [phone] (Phone) [phone] (Fax)     Reason for call:  Patient calling checking on the status of 02/23/17 ezetimibe (ZETIA) 10 MG tablet medication refill request, please advise

## 2017-03-08 ENCOUNTER — Other Ambulatory Visit: Payer: Self-pay

## 2017-03-08 ENCOUNTER — Telehealth: Payer: Self-pay

## 2017-03-08 DIAGNOSIS — E785 Hyperlipidemia, unspecified: Secondary | ICD-10-CM

## 2017-03-08 NOTE — Telephone Encounter (Signed)
Please advise  Copied from [place] [phone]. Topic: General - Other >> Mar 08, 2017 12:42 PM Marin Olp L wrote: Reason for CRM: Patient returning missed call from [facility]. Says she's not interested in starting Repatha.

## 2017-03-08 NOTE — Telephone Encounter (Signed)
Please call patient and discuss

## 2017-03-08 NOTE — Telephone Encounter (Signed)
Left message to return call, ok for [facility] to speak to patient.  Need to find out if patient is willing to see cardiology if interested in starting [facility].

## 2017-03-11 ENCOUNTER — Ambulatory Visit (INDEPENDENT_AMBULATORY_CARE_PROVIDER_SITE_OTHER): Payer: Medicare HMO | Admitting: Internal Medicine

## 2017-03-11 ENCOUNTER — Encounter: Payer: Self-pay | Admitting: Internal Medicine

## 2017-03-11 VITALS — BP 110/64 | HR 64 | Temp 98.0°F | Resp 16 | Ht 60.0 in | Wt 123.4 lb

## 2017-03-11 DIAGNOSIS — I150 Renovascular hypertension: Secondary | ICD-10-CM

## 2017-03-11 DIAGNOSIS — M545 Low back pain: Secondary | ICD-10-CM

## 2017-03-11 DIAGNOSIS — N200 Calculus of kidney: Secondary | ICD-10-CM

## 2017-03-11 DIAGNOSIS — E785 Hyperlipidemia, unspecified: Secondary | ICD-10-CM | POA: Diagnosis not present

## 2017-03-11 DIAGNOSIS — G8929 Other chronic pain: Secondary | ICD-10-CM

## 2017-03-11 DIAGNOSIS — Z85118 Personal history of other malignant neoplasm of bronchus and lung: Secondary | ICD-10-CM | POA: Diagnosis not present

## 2017-03-11 DIAGNOSIS — Z96641 Presence of right artificial hip joint: Secondary | ICD-10-CM

## 2017-03-11 DIAGNOSIS — R1033 Periumbilical pain: Secondary | ICD-10-CM

## 2017-03-11 DIAGNOSIS — Z1329 Encounter for screening for other suspected endocrine disorder: Secondary | ICD-10-CM | POA: Diagnosis not present

## 2017-03-11 DIAGNOSIS — R350 Frequency of micturition: Secondary | ICD-10-CM | POA: Diagnosis not present

## 2017-03-11 LAB — COMPREHENSIVE METABOLIC PANEL
ALBUMIN: 4.2 g/dL (ref 3.5–5.2)
ALT: 14 U/L (ref 0–35)
AST: 23 U/L (ref 0–37)
Alkaline Phosphatase: 100 U/L (ref 39–117)
BILIRUBIN TOTAL: 0.7 mg/dL (ref 0.2–1.2)
BUN: 21 mg/dL (ref 6–23)
CALCIUM: 9.2 mg/dL (ref 8.4–10.5)
CHLORIDE: 100 meq/L (ref 96–112)
CO2: 31 meq/L (ref 19–32)
Creatinine, Ser: 0.82 mg/dL (ref 0.40–1.20)
GFR: 71.01 mL/min (ref >60.00)
Glucose, Bld: 99 mg/dL (ref 70–99)
Potassium: 4.2 mEq/L (ref 3.5–5.1)
Sodium: 139 mEq/L (ref 135–145)
TOTAL PROTEIN: 6.7 g/dL (ref 6.0–8.3)

## 2017-03-11 LAB — CBC WITH DIFFERENTIAL/PLATELET
Basophils Absolute: 0 K/uL (ref 0.0–0.1)
Basophils Relative: 0.6 % (ref 0.0–3.0)
Eosinophils Absolute: 0.1 K/uL (ref 0.0–0.7)
Eosinophils Relative: 1.3 % (ref 0.0–5.0)
HCT: 41.1 % (ref 36.0–46.0)
Hemoglobin: 13.5 g/dL (ref 12.0–15.0)
Lymphocytes Relative: 23.5 % (ref 12.0–46.0)
Lymphs Abs: 1.5 K/uL (ref 0.7–4.0)
MCHC: 32.9 g/dL (ref 30.0–36.0)
MCV: 90.5 fl (ref 78.0–100.0)
Monocytes Absolute: 0.5 K/uL (ref 0.1–1.0)
Monocytes Relative: 7.4 % (ref 3.0–12.0)
Neutro Abs: 4.3 K/uL (ref 1.4–7.7)
Neutrophils Relative %: 67.2 % (ref 43.0–77.0)
Platelets: 255 K/uL (ref 150.0–400.0)
RBC: 4.54 Mil/uL (ref 3.87–5.11)
RDW: 13.2 % (ref 11.5–15.5)
WBC: 6.4 K/uL (ref 4.0–10.5)

## 2017-03-11 LAB — URINALYSIS, ROUTINE W REFLEX MICROSCOPIC
BILIRUBIN URINE: NEGATIVE
Hgb urine dipstick: NEGATIVE
Ketones, ur: NEGATIVE
LEUKOCYTES UA: NEGATIVE
NITRITE: NEGATIVE
PH: 7 (ref 5.0–8.0)
Specific Gravity, Urine: 1.01 (ref 1.000–1.030)
TOTAL PROTEIN, URINE-UPE24: NEGATIVE
URINE GLUCOSE: NEGATIVE
UROBILINOGEN UA: 0.2 (ref 0.0–1.0)

## 2017-03-11 LAB — TSH: TSH: 0.76 u[IU]/mL (ref 0.35–4.50)

## 2017-03-11 LAB — LIPID PANEL
CHOLESTEROL: 239 mg/dL — AB (ref 0–200)
HDL: 62 mg/dL (ref >39.00)
LDL Cholesterol: 158 mg/dL — ABNORMAL HIGH (ref 0–99)
NonHDL: 176.85
Total CHOL/HDL Ratio: 4
Triglycerides: 94 mg/dL (ref 0.0–149.0)
VLDL: 18.8 mg/dL (ref 0.0–40.0)

## 2017-03-11 LAB — T4, FREE: Free T4: 1.1 ng/dL (ref 0.60–1.60)

## 2017-03-11 NOTE — Progress Notes (Addendum)
Chief Complaint  Patient presents with  . Follow-up   F/u  1. Pt denies abdominal pain unless abdomen pressed. [place] + 1.0 cm right upper pole kidney stone advised pt if sx'matic will need to see urology in future. She does report right flank pain at times  2. HLD-on Zetia intolerant to statins not interested in Repatha disc'ed red yeast rice but no cardiac/stroke benefit 3. Reviewed low back Xray with scoliosis with curvature 17 degrees more to the left and DDD. Back pain is intermittent at times 10/10. She has done PT in the past. If she does too much activity or twisting back pain is worse but she wears a copper belt that helps. She walks 1 to 1 and 1/4 or 1 and 1/2 mile daily weather permitting. Tramadol helps back and Tylenol/Advil did not help in the past.Also she reports she cant tolerate meds like muscle relaxers or gabapentin. Currently does not want to w/u with MRI. New is pain down right leg to thigh that at times wakes her up at night and is a dull pulsating pain  4. C/o right hip pain s/p prosthesis intermittently    Review of Systems  Constitutional: Negative for weight loss.  HENT: Positive for hearing loss.        +wears aids   Respiratory: Negative for shortness of breath.   Cardiovascular: Negative for chest pain.  Gastrointestinal: Negative for abdominal pain.  Genitourinary:       No urinary sx's   Musculoskeletal: Positive for back pain and joint pain.  Skin: Negative for rash.  Neurological: Positive for sensory change.  Psychiatric/Behavioral: Negative for depression.   Past Medical History:  Diagnosis Date  . Arthritis    "qwhere"  . Carotid artery disease ([place])   . Chronic lower back pain   . Epistaxis    with use of nasal sprays   . Headache    "lately; related to HTN" (04/24/2014)  . Hyperlipidemia   . Hypertension   . Hyponatremia    h/o  . Kidney stone   . Lung cancer ([place]) 2003   s/p left lower lobe resection  . Non-obstructive CAD    a. 04/2014  Cath: LM nl, LAD/D1/LCX/RCA min irregs, RPDA/PAV nl.  . Orthostatic hypotension   . OSA on CPAP    "wear it intermittently"  . PONV (postoperative nausea and vomiting)    "when I was younger"  . Renal artery stenosis ([place])    a. 04/2014 Renal angiography: LRA 95p, RRA 40ost. PTA of LRA deferred 2/2 guide cath induced Ao dissection;  01/27 6 x 15 mm Herculink stent to the Left-RA  follows with [facility] Dr. Ronalee Belts  . Sinus headache    Past Surgical History:  Procedure Laterality Date  . APPENDECTOMY    . BILATERAL OOPHORECTOMY Bilateral 2000's  . CARDIAC CATHETERIZATION  04/24/2014  . CATARACT EXTRACTION W/ INTRAOCULAR LENS  IMPLANT, BILATERAL Bilateral   . JOINT REPLACEMENT    . LEFT HEART CATHETERIZATION WITH CORONARY ANGIOGRAM N/A 04/24/2014   Procedure: LEFT HEART CATHETERIZATION WITH CORONARY ANGIOGRAM;  Surgeon: Wellington Hampshire, MD;  Location: [facility];  Service: Cardiovascular;  Laterality: N/A;  . LUNG LOBECTOMY Left 2003   "lower lobe"  . PERIPHERAL VASCULAR CATHETERIZATION  05/01/2014   Procedure: RENAL INTERVENTION;  Surgeon: Wellington Hampshire, MD; 6 x 15 mm Herculink stent to the Left Renal Artery  . RENAL ANGIOGRAM  04/24/2014  . RENAL ANGIOGRAM N/A 04/24/2014   Procedure: RENAL ANGIOGRAM;  Surgeon:  Wellington Hampshire, MD;  Location: [facility];  Service: Cardiovascular;  Laterality: N/A;  . RENAL ANGIOGRAM Left 05/01/2014   Procedure: RENAL ANGIOGRAM;  Surgeon: Wellington Hampshire, MD;  Location: [facility];  Service: Cardiovascular;  Laterality: Left;  . REPLACEMENT TOTAL KNEE Left   . TOTAL HIP ARTHROPLASTY Right   . TUBAL LIGATION  ~ 1975  . VAGINAL DELIVERY  X 2  . VAGINAL HYSTERECTOMY  1980's   Family History  Problem Relation Age of Onset  . Parkinsonism Mother   . Alzheimer's disease Mother   . Sarcoidosis Brother   . Stroke Maternal Grandmother   . Cancer Paternal Grandmother    Social History   Socioeconomic History  . Marital status: Widowed    Spouse name:  Not on file  . Number of children: Not on file  . Years of education: Not on file  . Highest education level: Not on file  Social Needs  . Financial resource strain: Not on file  . Food insecurity - worry: Not on file  . Food insecurity - inability: Not on file  . Transportation needs - medical: Not on file  . Transportation needs - non-medical: Not on file  Occupational History  . Not on file  Tobacco Use  . Smoking status: Never Smoker  . Smokeless tobacco: Never Used  Substance and Sexual Activity  . Alcohol use: Yes    Comment: 04/24/2014 "glass of beer or wine q couple weeks"  . Drug use: No  . Sexual activity: Not on file    Comment: Father and husband both smokers  Other Topics Concern  . Not on file  Social History Narrative   Lives in [place]. Daughter nearby.   Normal ADLs as of 02/2017 and no h/o falls    Likes to walk   Lives alone    Current Meds  Medication Sig  . albuterol (PROVENTIL HFA;VENTOLIN HFA) 108 (90 Base) MCG/ACT inhaler Inhale 2 puffs into the lungs every 6 (six) hours as needed for wheezing or shortness of breath.  . Ascorbic Acid (VITAMIN C CR) 1500 MG TBCR Take by mouth daily.  Marland Kitchen aspirin 81 MG tablet Take 81 mg by mouth daily.  . B Complex Vitamins (VITAMIN B COMPLEX PO) Take by mouth.  . carvedilol (COREG) 12.5 MG tablet Take 1 tablet (12.5 mg total) by mouth 2 (two) times daily with a meal. (Patient taking differently: Take 0.5 mg 2 (two) times daily by mouth. )  . Cholecalciferol 2000 UNITS TABS Take 1 tablet by mouth daily.  Marland Kitchen ezetimibe (ZETIA) 10 MG tablet Take 1 tablet (10 mg total) by mouth daily.  . fexofenadine (ALLEGRA) 180 MG tablet Take by mouth. Reported on 07/10/2015  . Fish Oil OIL by Does not apply route.  . fluticasone (FLONASE) 50 MCG/ACT nasal spray Place 1 spray into both nostrils daily as needed.  . hydrALAZINE (APRESOLINE) 25 MG tablet Take 1 tablet (25 mg total) by mouth 3 (three) times daily. (Patient taking differently:  Take 25 mg by mouth as needed. )  . losartan-hydrochlorothiazide (HYZAAR) 100-25 MG tablet Take 1 tablet by mouth daily.  Marland Kitchen Lysine HCl 500 MG CAPS Take 1,000 mg by mouth daily as needed (for cold sores).   . magnesium 30 MG tablet Take 30 mg 2 (two) times daily by mouth.  . pantoprazole (PROTONIX) 40 MG tablet Take 1 tablet (40 mg total) by mouth daily.  . traMADol (ULTRAM) 50 MG tablet Take 1 tablet (50 mg  total) by mouth every 8 (eight) hours as needed.  . traZODone (DESYREL) 50 MG tablet Take 0.5-1 tablets (25-50 mg total) by mouth at bedtime as needed for sleep.  Marland Kitchen Zinc 50 MG TABS Take by mouth daily.   Allergies  Allergen Reactions  . Atorvastatin Other (See Comments)    Muscle weakness  . Penicillins Hives  . Sulfa Antibiotics Hives  . Amlodipine     Weakness and confusion.    No results found for this or any previous visit (from the past 2160 hour(s)). Objective  Body mass index is 24.1 kg/m. Wt Readings from Last 3 Encounters:  03/11/17 123 lb 6 oz (56 kg)  02/22/17 125 lb 9.6 oz (57 kg)  02/02/17 124 lb (56.2 kg)   Temp Readings from Last 3 Encounters:  03/11/17 98 F (36.7 C) (Oral)  02/22/17 98.2 F (36.8 C) (Oral)  02/20/16 97.7 F (36.5 C) (Oral)   BP Readings from Last 3 Encounters:  03/11/17 110/64  02/22/17 (!) 144/78  02/02/17 (!) 159/75   Pulse Readings from Last 3 Encounters:  03/11/17 64  02/22/17 65  02/02/17 (!) 57   Pulse oximetry on room air is 96% Physical Exam  Constitutional: She is oriented to person, place, and time and well-developed, well-nourished, and in no distress. Vital signs are normal.  HENT:  Head: Normocephalic and atraumatic.  Mouth/Throat: Oropharynx is clear and moist and mucous membranes are normal.  Eyes: Conjunctivae are normal. Pupils are equal, round, and reactive to light.  Cardiovascular: Normal rate, regular rhythm and normal heart sounds.  Pulmonary/Chest: Effort normal and breath sounds normal.  Abdominal:  Soft. Bowel sounds are normal. There is generalized tenderness.  Mild generalized ttp   Neurological: She is alert and oriented to person, place, and time. Gait normal.  Skin: Skin is warm, dry and intact. No rash noted.  Psychiatric: Mood, memory, affect and judgment normal.  Nursing note and vitals reviewed.  Assessment   1. Low back pain with right radiculopathy with lumbar scoliosis  2. Right hip pain Xray 11/15/14 normal  3. HLD 4. Abdominal pain not signficant on exam right upper pole 1 cm kidney stone no hydronephrosis  5. HM  Plan  1.  Prn Tramadol  Hold PT and MRI lumbar for now per pt request  2. Consider Xray if continues to bother  3. Declines Repatha  On zetia disc red yeast rice  4.  If flank pain worse refer urology kidney stone not likely to pass 1 cm kidney stone   Given copy of US abdomen and Xray low back today  5.  UTD vaccines consider shingrix in future  Labs today CMET, CBC, lipid, TSH,T4, UA   Do cologaurd faxed order today cologaurd neg 04/19/17   Out of age window pap, mammo though will disc mammo future   DEXA T score neck -1.5 to -2.4 06/2010 may want to repeat in future   Consider CT chest future h/o lung cancer. CT chest 04/18/14 mild scarring and s/p left lower lobectomy h/o lung cancer, monitor   Provider: Dr. Olivia Mackie McLean-Scocuzza

## 2017-03-11 NOTE — Telephone Encounter (Signed)
Patient came in for office visit today and she informed me she wasn't interested in starting Repatha is this correct? Thanks

## 2017-03-11 NOTE — Patient Instructions (Signed)
Think about red yeast rice  Please do cologuard on Monday/Tuesday and mail back same day  Happy Holidays  F/u in 4-6 months   Cholesterol Cholesterol is a white, waxy, fat-like substance that is needed by the human body in small amounts. The liver makes all the cholesterol we need. Cholesterol is carried from the liver by the blood through the blood vessels. Deposits of cholesterol (plaques) may build up on blood vessel (artery) walls. Plaques make the arteries narrower and stiffer. Cholesterol plaques increase the risk for heart attack and stroke. You cannot feel your cholesterol level even if it is very high. The only way to know that it is high is to have a blood test. Once you know your cholesterol levels, you should keep a record of the test results. Work with your health care provider to keep your levels in the desired range. What do the results mean?  Total cholesterol is a rough measure of all the cholesterol in your blood.  LDL (low-density lipoprotein) is the "bad" cholesterol. This is the type that causes plaque to build up on the artery walls. You want this level to be low.  HDL (high-density lipoprotein) is the "good" cholesterol because it cleans the arteries and carries the LDL away. You want this level to be high.  Triglycerides are fat that the body can either burn for energy or store. High levels are closely linked to heart disease. What are the desired levels of cholesterol?  Total cholesterol below 200.  LDL below 100 for people who are at risk, below 70 for people at very high risk.  HDL above 40 is good. A level of 60 or higher is considered to be protective against heart disease.  Triglycerides below 150. How can I lower my cholesterol? Diet Follow your diet program as told by your health care provider.  Choose fish or white meat chicken and [place], roasted or baked. Limit fatty cuts of red meat, fried foods, and processed meats, such as sausage and lunch  meats.  Eat lots of fresh fruits and vegetables.  Choose whole grains, beans, pasta, potatoes, and cereals.  Choose olive oil, corn oil, or canola oil, and use only small amounts.  Avoid butter, mayonnaise, shortening, or palm kernel oils.  Avoid foods with trans fats.  Drink skim or nonfat milk and eat low-fat or nonfat yogurt and cheeses. Avoid whole milk, cream, ice cream, egg yolks, and full-fat cheeses.  Healthier desserts include angel food cake, ginger snaps, animal crackers, hard candy, popsicles, and low-fat or nonfat frozen yogurt. Avoid pastries, cakes, pies, and cookies.  Exercise  Follow your exercise program as told by your health care provider. A regular program: ? Helps to decrease LDL and raise HDL. ? Helps with weight control.  Do things that increase your activity level, such as gardening, walking, and taking the stairs.  Ask your health care provider about ways that you can be more active in your daily life.  Medicine  Take over-the-counter and prescription medicines only as told by your health care provider. ? Medicine may be prescribed by your health care provider to help lower cholesterol and decrease the risk for heart disease. This is usually done if diet and exercise have failed to bring down cholesterol levels. ? If you have several risk factors, you may need medicine even if your levels are normal.  This information is not intended to replace advice given to you by your health care provider. Make sure you discuss any questions  you have with your health care provider. Document Released: 12/15/2000 Document Revised: 10/18/2015 Document Reviewed: 09/20/2015 Elsevier Interactive Patient Education  2017 [employer].   Kidney Stones Kidney stones (urolithiasis) are solid, rock-like deposits that form inside of the organs that make urine (kidneys). A kidney stone may form in a kidney and move into the bladder, where it can cause intense pain and block the  flow of urine. Kidney stones are created when high levels of certain minerals are found in the urine. They are usually passed through urination, but in some cases, medical treatment may be needed to remove them. What are the causes? Kidney stones may be caused by:  A condition in which certain glands produce too much parathyroid hormone (primary hyperparathyroidism), which causes too much calcium buildup in the blood.  Buildup of uric acid crystals in the bladder (hyperuricosuria). Uric acid is a chemical that the body produces when you eat certain foods. It usually exits the body in the urine.  Narrowing (stricture) of one or both of the tubes that drain urine from the kidneys to the bladder (ureters).  A kidney blockage that is present at birth (congenital obstruction).  Past surgery on the kidney or the ureters, such as gastric bypass surgery.  What increases the risk? The following factors make you more likely to develop kidney stones:  Having had a kidney stone in the past.  Having a family history of kidney stones.  Not drinking enough water.  Eating a diet that is high in protein, salt (sodium), or sugar.  Being overweight or obese.  What are the signs or symptoms? Symptoms of a kidney stone may include:  Nausea.  Vomiting.  Blood in the urine (hematuria).  Pain in the side of the abdomen, right below the ribs (flank pain). Pain usually spreads (radiates) to the groin.  Needing to urinate frequently or urgently.  How is this diagnosed? This condition may be diagnosed based on:  Your medical history.  A physical exam.  Blood tests.  Urine tests.  CT scan.  Abdominal X-ray.  A procedure to examine the inside of the bladder (cystoscopy).  How is this treated? Treatment for kidney stones depends on the size, location, and makeup of the stones. Treatment may involve:  Analyzing your urine before and after you pass the stone through urination.  Being  monitored at the hospital until you pass the stone through urination.  Increasing your fluid intake and decreasing the amount of calcium and protein in your diet.  A procedure to break up kidney stones in the bladder using: ? A focused beam of light (laser therapy). ? Shock waves (extracorporeal shock wave lithotripsy).  Surgery to remove kidney stones. This may be needed if you have severe pain or have stones that block your urinary tract.  Follow these instructions at home: Eating and drinking   Drink enough fluid to keep your urine clear or pale yellow. This will help you to pass the kidney stone.  If directed, change your diet. This may include: ? Limiting how much sodium you eat. ? Eating more fruits and vegetables. ? Limiting how much meat, poultry, fish, and eggs you eat.  Follow instructions from your health care provider about eating or drinking restrictions. General instructions  Collect urine samples as told by your health care provider. You may need to collect a urine sample: ? 24 hours after you pass the stone. ? 8-12 weeks after passing the kidney stone, and every 6-12 months after that.  Strain your urine every time you urinate, for as long as directed. Use the strainer that your health care provider recommends.  Do not throw out the kidney stone after passing it. Keep the stone so it can be tested by your health care provider. Testing the makeup of your kidney stone may help prevent you from getting kidney stones in the future.  Take over-the-counter and prescription medicines only as told by your health care provider.  Keep all follow-up visits as told by your health care provider. This is important. You may need follow-up X-rays or ultrasounds to make sure that your stone has passed. How is this prevented? To prevent another kidney stone:  Drink enough fluid to keep your urine clear or pale yellow. This is the best way to prevent kidney stones.  Eat a healthy  diet and follow recommendations from your health care provider about foods to avoid. You may be instructed to eat a low-protein diet. Recommendations vary depending on the type of kidney stone that you have.  Maintain a healthy weight.  Contact a health care provider if:  You have pain that gets worse or does not get better with medicine. Get help right away if:  You have a fever or chills.  You develop severe pain.  You develop new abdominal pain.  You faint.  You are unable to urinate. This information is not intended to replace advice given to you by your health care provider. Make sure you discuss any questions you have with your health care provider. Document Released: 03/22/2005 Document Revised: 10/10/2015 Document Reviewed: 09/05/2015 Elsevier Interactive Patient Education  2017 [employer].

## 2017-03-11 NOTE — Telephone Encounter (Signed)
Yes no Repatha   [place]

## 2017-04-12 DIAGNOSIS — Z961 Presence of intraocular lens: Secondary | ICD-10-CM | POA: Diagnosis not present

## 2017-04-12 DIAGNOSIS — H524 Presbyopia: Secondary | ICD-10-CM | POA: Diagnosis not present

## 2017-04-13 ENCOUNTER — Telehealth: Payer: Self-pay

## 2017-04-13 NOTE — Telephone Encounter (Signed)
She received second kit

## 2017-04-13 NOTE — Telephone Encounter (Signed)
Spoke to patient she received kit and she was instructed by them to call for pick up when specimen is ready to be picked up.  

## 2017-04-13 NOTE — Telephone Encounter (Signed)
Ok so she has second kit?  Thanks tMS

## 2017-04-13 NOTE — Telephone Encounter (Signed)
-----  Message from Delorise Jackson, MD sent at 04/11/2017  6:37 PM EST ----- She has to do new cologaurd on Monday/Tuesday and return on those days. They should be sending her a new kit but if not please contact our office the sample collected was not a good sample   Thanks [place]

## 2017-04-17 DIAGNOSIS — Z1212 Encounter for screening for malignant neoplasm of rectum: Secondary | ICD-10-CM | POA: Diagnosis not present

## 2017-04-17 DIAGNOSIS — Z1211 Encounter for screening for malignant neoplasm of colon: Secondary | ICD-10-CM | POA: Diagnosis not present

## 2017-04-18 ENCOUNTER — Other Ambulatory Visit: Payer: Self-pay | Admitting: Internal Medicine

## 2017-04-18 LAB — COLOGUARD: COLOGUARD: NEGATIVE

## 2017-04-18 NOTE — Telephone Encounter (Signed)
Copied from [place] [id number]. Topic: Quick Communication - Rx Refill/Question >> Apr 18, 2017 10:28 AM Ahmed Prima L wrote: Medication: traMADol (ULTRAM) 50 MG tablet 90 day supply   Has the patient contacted their pharmacy? yes   (Agent: If no, request that the patient contact the pharmacy for the refill.)   Preferred Pharmacy (with phone number or street name): [facility]: Please be advised that RX refills may take up to 3 business days. We ask that you follow-up with your pharmacy.

## 2017-04-18 NOTE — Telephone Encounter (Signed)
Tramadol refill.Last refill 104/18.

## 2017-04-18 NOTE — Telephone Encounter (Signed)
Please advise 

## 2017-04-19 NOTE — Telephone Encounter (Signed)
Last office visit 03/08/17 Next office visit 07/11/17

## 2017-04-20 ENCOUNTER — Telehealth: Payer: Self-pay

## 2017-04-20 MED ORDER — TRAMADOL HCL 50 MG PO TABS: 50.0000 mg | ORAL_TABLET | Freq: Every day | ORAL | 0 refills | Status: AC | PRN

## 2017-04-20 MED ORDER — TRAMADOL HCL 50 MG PO TABS: 50.0000 mg | ORAL_TABLET | Freq: Every day | ORAL | 0 refills | Status: DC | PRN

## 2017-04-20 NOTE — Telephone Encounter (Signed)
Left voice mail to call back ok for [facility] to speak to patient 

## 2017-04-20 NOTE — Telephone Encounter (Signed)
Pt is taking 1x per day prn 50 mg Tramadol Please phone/fax into [facility] if cant then [phone]  Thanks [place]

## 2017-04-20 NOTE — Telephone Encounter (Signed)
Faxed script to [facility] patient advised.

## 2017-04-20 NOTE — Telephone Encounter (Signed)
-----   Message from Delorise Jackson, MD sent at 04/19/2017  8:46 PM EST ----- How often is she taking tramadol? After you find out please let me know  Thanks [place]

## 2017-04-20 NOTE — Telephone Encounter (Signed)
Copied from [place] [phone]. Topic: Quick Communication - Office Called Patient >> Apr 20, 2017 11:28 AM Johna Sheriff, CMA wrote: Reason for CRM: see telephone encounter regarding tramadol  >> Apr 20, 2017 12:17 PM Neva Seat wrote: Pt returned missed call. Pt believes the call was about tramadol Rx. [facility] office says [facility] can speak with pt.   Please call pt @ [phone]

## 2017-04-21 DIAGNOSIS — Z820 Family history of epilepsy and other diseases of the nervous system: Secondary | ICD-10-CM | POA: Diagnosis not present

## 2017-04-21 DIAGNOSIS — I10 Essential (primary) hypertension: Secondary | ICD-10-CM | POA: Diagnosis not present

## 2017-04-21 DIAGNOSIS — Z833 Family history of diabetes mellitus: Secondary | ICD-10-CM | POA: Diagnosis not present

## 2017-04-21 DIAGNOSIS — E785 Hyperlipidemia, unspecified: Secondary | ICD-10-CM | POA: Diagnosis not present

## 2017-04-21 DIAGNOSIS — Z7982 Long term (current) use of aspirin: Secondary | ICD-10-CM | POA: Diagnosis not present

## 2017-04-21 DIAGNOSIS — G8929 Other chronic pain: Secondary | ICD-10-CM | POA: Diagnosis not present

## 2017-04-21 DIAGNOSIS — R32 Unspecified urinary incontinence: Secondary | ICD-10-CM | POA: Diagnosis not present

## 2017-04-21 DIAGNOSIS — Z79891 Long term (current) use of opiate analgesic: Secondary | ICD-10-CM | POA: Diagnosis not present

## 2017-04-21 DIAGNOSIS — Z803 Family history of malignant neoplasm of breast: Secondary | ICD-10-CM | POA: Diagnosis not present

## 2017-04-21 DIAGNOSIS — Z823 Family history of stroke: Secondary | ICD-10-CM | POA: Diagnosis not present

## 2017-04-28 ENCOUNTER — Other Ambulatory Visit: Payer: Self-pay | Admitting: *Deleted

## 2017-04-28 DIAGNOSIS — I701 Atherosclerosis of renal artery: Secondary | ICD-10-CM

## 2017-05-05 ENCOUNTER — Encounter: Payer: Self-pay | Admitting: Family Medicine

## 2017-05-05 ENCOUNTER — Ambulatory Visit (INDEPENDENT_AMBULATORY_CARE_PROVIDER_SITE_OTHER): Payer: Medicare HMO | Admitting: Family Medicine

## 2017-05-05 VITALS — BP 130/62 | HR 72 | Temp 98.6°F | Wt 125.0 lb

## 2017-05-05 DIAGNOSIS — H6691 Otitis media, unspecified, right ear: Secondary | ICD-10-CM | POA: Diagnosis not present

## 2017-05-05 MED ORDER — AZITHROMYCIN 250 MG PO TABS: ORAL_TABLET | ORAL | 0 refills | Status: DC

## 2017-05-05 NOTE — Patient Instructions (Signed)
Please take medication as directed. Please drink plenty of water so that your urine is pale yellow or clear. Also, get plenty of rest, use tylenol as  needed for discomfort and follow up if symptoms do not improve in 3 to 4 days, worsen, or you develop a fever >101.   Otitis Media, Adult Otitis media is redness, soreness, and puffiness (swelling) in the space just behind your eardrum (middle ear). It may be caused by allergies or infection. It often happens along with a cold. Follow these instructions at home:  Take your medicine as told. Finish it even if you start to feel better.  Only take over-the-counter or prescription medicines for pain, discomfort, or fever as told by your doctor.  Follow up with your doctor as told. Contact a doctor if:  You have otitis media only in one ear, or bleeding from your nose, or both.  You notice a lump on your neck.  You are not getting better in 3-5 days.  You feel worse instead of better. Get help right away if:  You have pain that is not helped with medicine.  You have puffiness, redness, or pain around your ear.  You get a stiff neck.  You cannot move part of your face (paralysis).  You notice that the bone behind your ear hurts when you touch it. This information is not intended to replace advice given to you by your health care provider. Make sure you discuss any questions you have with your health care provider. Document Released: 09/08/2007 Document Revised: 08/28/2015 Document Reviewed: 10/17/2012 Elsevier Interactive Patient Education  2017 [employer].

## 2017-05-05 NOTE — Progress Notes (Signed)
Patient ID: Tamara Burton, female   DOB: Apr 19, 1933, 84 y.o.   MRN: [id number]  PCP: McLean-Scocuzza, Nino Glow, MD  Subjective:  Tamara Burton is a 84 y.o. year old very pleasant female patient who presents with symptoms including nasal congestion, right ear pain, sinus pressure/pain, cough that is nonproductive. Ear pain is her most bothersome symptom. -started: one week ago, symptoms were improving but have returned with ear pain. -previous treatments: flonase has provided limited benefit, acetaminophen has provided moderate benefit -sick contacts/travel/risks: denies flu exposure. Influenza vaccine is UTD; no recent sick contact exposure -Hx of: allergies, significant seasonal allergies  She reports symptoms were improving and then returned.  No recent antibiotic use  ROS-denies fever, SOB, NVD, tooth pain  Pertinent Past Medical History- Renovascular hypertension, lung cancer  Medications- reviewed  Current Outpatient Medications  Medication Sig Dispense Refill  . albuterol (PROVENTIL HFA;VENTOLIN HFA) 108 (90 Base) MCG/ACT inhaler Inhale 2 puffs into the lungs every 6 (six) hours as needed for wheezing or shortness of breath. 1 Inhaler 0  . Ascorbic Acid (VITAMIN C CR) 1500 MG TBCR Take by mouth daily.    Marland Kitchen aspirin 81 MG tablet Take 81 mg by mouth daily.    . B Complex Vitamins (VITAMIN B COMPLEX PO) Take by mouth.    . carvedilol (COREG) 12.5 MG tablet Take 1 tablet (12.5 mg total) by mouth 2 (two) times daily with a meal. (Patient taking differently: Take 0.5 mg 2 (two) times daily by mouth. ) 180 tablet 3  . Cholecalciferol 2000 UNITS TABS Take 1 tablet by mouth daily.    Marland Kitchen ezetimibe (ZETIA) 10 MG tablet Take 1 tablet (10 mg total) by mouth daily. 90 tablet 1  . fexofenadine (ALLEGRA) 180 MG tablet Take by mouth. Reported on 07/10/2015    . Fish Oil OIL by Does not apply route.    . fluticasone (FLONASE) 50 MCG/ACT nasal spray Place 1 spray into both nostrils daily as needed. 16 g  2  . hydrALAZINE (APRESOLINE) 25 MG tablet Take 1 tablet (25 mg total) by mouth 3 (three) times daily. (Patient taking differently: Take 25 mg by mouth as needed. ) 90 tablet 6  . losartan-hydrochlorothiazide (HYZAAR) 100-25 MG tablet Take 1 tablet by mouth daily. 90 tablet 3  . Lysine HCl 500 MG CAPS Take 1,000 mg by mouth daily as needed (for cold sores).     . magnesium 30 MG tablet Take 30 mg 2 (two) times daily by mouth.    . pantoprazole (PROTONIX) 40 MG tablet Take 1 tablet (40 mg total) by mouth daily. 90 tablet 3  . traMADol (ULTRAM) 50 MG tablet Take 1 tablet (50 mg total) by mouth daily as needed. For back pain/joint pain 90 tablet 0  . traZODone (DESYREL) 50 MG tablet Take 0.5-1 tablets (25-50 mg total) by mouth at bedtime as needed for sleep. 90 tablet 0  . Zinc 50 MG TABS Take by mouth daily.     No current facility-administered medications for this visit.     Objective: BP 130/62   Pulse 72   Temp 98.6 F (37 C) (Oral)   Wt 125 lb (56.7 kg)   LMP  (LMP Unknown)   SpO2 95%   BMI 24.41 kg/m  Gen: NAD, resting comfortably HEENT: Turbinates mildly erythematous, right TM erythematous and bulging; . Left TM dull; no evidence of rupture or drainage noted in canal; oropharynx clear and moist; mild maxillary sinus tenderness noted CV: RRR no murmurs rubs or  gallops Lungs: CTAB no crackles, wheeze, rhonchi Abdomen: soft/nontender/nondistended/normal bowel sounds. No rebound or guarding.  Ext: no edema Skin: warm, dry, no rash,  Neuro: grossly normal, moves all extremities  Assessment/Plan:  1. Right otitis media, unspecified otitis media type  Exam and history support treatment for AOM; prior history of significant seasonal allergies noted; reviewed importance of close follow up if symptoms do not improve with treatment, worsen, or new symptoms such as fever >100 appear. Acetaminophen can be used for symptom relief. Advised use of saline gel for dry nasal passages.  History  of PCN allergy; opted for azithromycin for treatment today.  Finally, we reviewed reasons to return to care including if symptoms worsen or persist or new concerns arise- once again particularly shortness of breath or fever.    Tamara Quint, FNP

## 2017-05-16 DIAGNOSIS — R69 Illness, unspecified: Secondary | ICD-10-CM | POA: Diagnosis not present

## 2017-06-06 ENCOUNTER — Telehealth: Payer: Self-pay | Admitting: Cardiovascular Disease

## 2017-06-06 ENCOUNTER — Other Ambulatory Visit: Payer: Self-pay

## 2017-06-06 MED ORDER — LOSARTAN POTASSIUM-HCTZ 100-25 MG PO TABS: 1.0000 | ORAL_TABLET | Freq: Every day | ORAL | 0 refills | Status: DC

## 2017-06-06 NOTE — Telephone Encounter (Signed)
*  STAT* If patient is at the pharmacy, call can be transferred to refill team.   1. Which medications need to be refilled? (please list name of each medication and dose if known) losartan   2. Which pharmacy/location (including street and city if local pharmacy) is medication to be sent to? [facility] mail order   3. Do they need a 30 day or 90 day supply? [place]

## 2017-06-06 NOTE — Telephone Encounter (Signed)
Pt states she has been very sleepy and thinks it may be the combination of her losartan and carvedilol. States when she walks she gets tirred very quickly. Please call to discuss.

## 2017-06-06 NOTE — Telephone Encounter (Signed)
I spoke with patient who reports low BP the last two weeks. 3/4  113/59  59 3/3  118/67  69 3/2  122/62  61 3/1  95/55  67    SBP 90s-110s with HR upper 50s to 60s. Denies dizzy; occasionally lightheaded. She feels better when SBP is in the 130s She stays hydrated, drinking three 16oz bottles of water daily along with iced tea.  She walks 1.25 miles every morning and afternoon but has found it to be difficult recently as her legs feel like they are going to give out. Patient is unusually tired and has to rest and/or take naps during the day.  Reviewed medication list: Coreg 6.25mg  BID Hyzaar 100-25mg  QD Hydralazine 25mg  BID (she has not taken recently)  In the past, she has taken coreg 12.5mg  BID but it had to be decreased due to similar symptoms that she is having now.  Advised patient to check BP prior to evening coreg and hold if SBP <110 or HR <60. Will route to MD for consideration of medication change.

## 2017-06-06 NOTE — Telephone Encounter (Signed)
Decrease carvedilol to 3.125 mg twice daily.

## 2017-06-07 ENCOUNTER — Other Ambulatory Visit: Payer: Self-pay

## 2017-06-07 NOTE — Telephone Encounter (Signed)
I spoke with patient. She declined a new coreg prescription stating she would like to cut coreg 12.5mg  which she has, in quarters. We discussed how this could be difficult to be sure she gets to correct dosage but she just got a refill and doesn't want to waste the pills. She then mentioned BP was 170 last evening so she took coreg as prescribed and will "continue to play with" coreg dosage until she sees PCP in two weeks.  She now thinks that fatigue might be due to zetia which she will also discuss with PCP. She will continue to monitor BP and HR and call if she continues to experience hypotension or low heart rate.  She is appreciative of the information with no further questions

## 2017-06-30 ENCOUNTER — Ambulatory Visit: Payer: Medicare HMO | Admitting: Cardiovascular Disease

## 2017-06-30 ENCOUNTER — Encounter: Payer: Self-pay | Admitting: Cardiovascular Disease

## 2017-06-30 VITALS — BP 110/68 | HR 70 | Ht 60.0 in | Wt 127.0 lb

## 2017-06-30 DIAGNOSIS — I701 Atherosclerosis of renal artery: Secondary | ICD-10-CM

## 2017-06-30 DIAGNOSIS — I739 Peripheral vascular disease, unspecified: Secondary | ICD-10-CM

## 2017-06-30 DIAGNOSIS — E785 Hyperlipidemia, unspecified: Secondary | ICD-10-CM

## 2017-06-30 DIAGNOSIS — I779 Disorder of arteries and arterioles, unspecified: Secondary | ICD-10-CM | POA: Diagnosis not present

## 2017-06-30 DIAGNOSIS — I10 Essential (primary) hypertension: Secondary | ICD-10-CM

## 2017-06-30 NOTE — Progress Notes (Signed)
Cardiology Office Note   Date:  06/30/2017   ID:  Tamara Burton, DOB January 27, 1934, MRN [id number]  PCP:  McLean-Scocuzza, Nino Glow, MD  Cardiologist:   Kathlyn Sacramento, MD   Chief Complaint  Patient presents with  . Other    6 month follow up. Patient c/o in the last month having muscle pain under left breast. Meds reviewed verbally with patient.       History of Present Illness: Tamara Burton is a 84 y.o. female who presents for a follow-up visit regarding hypertension and renal artery stenosis status post left renal artery stenting in 04/2014 . Cardiac cath was done in January of 2016 due to chest pain and shortness of breath showed no significant coronary artery disease with normal left ventricular end-diastolic pressure. She has carotid disease followed by [facility]. She has known history of labile hypertension which typically correlates with anxiety.   She has been doing extremely well with no chest pain or shortness of breath.  Blood pressure has been well controlled and she actually had to cut down the dose of carvedilol to 3.25 mg twice daily due to low blood pressure and fatigue.  She also complains of continued leg pain and thus she stopped taking Zetia.  She is intolerant to statins.    Past Medical History:  Diagnosis Date  . Arthritis    "qwhere"  . Carotid artery disease ([place])   . Chronic lower back pain   . Colon polyps    benign per pt   . Epistaxis    with use of nasal sprays   . Headache    "lately; related to HTN" (04/24/2014)  . Hyperlipidemia   . Hypertension   . Hyponatremia    h/o  . Kidney stone   . Lung cancer ([place]) 2003   s/p left lower lobe resection  . Non-obstructive CAD    a. 04/2014 Cath: LM nl, LAD/D1/LCX/RCA min irregs, RPDA/PAV nl.  . Orthostatic hypotension   . OSA on CPAP    "wear it intermittently"  . PONV (postoperative nausea and vomiting)    "when I was younger"  . Renal artery stenosis ([place])    a. 04/2014 Renal angiography: LRA  95p, RRA 40ost. PTA of LRA deferred 2/2 guide cath induced Ao dissection;  01/27 6 x 15 mm Herculink stent to the Left-RA  follows with [facility] Dr. Ronalee Belts  . Sinus headache     Past Surgical History:  Procedure Laterality Date  . APPENDECTOMY    . BILATERAL OOPHORECTOMY Bilateral 2000's  . CARDIAC CATHETERIZATION  04/24/2014  . CATARACT EXTRACTION W/ INTRAOCULAR LENS  IMPLANT, BILATERAL Bilateral   . JOINT REPLACEMENT    . LEFT HEART CATHETERIZATION WITH CORONARY ANGIOGRAM N/A 04/24/2014   Procedure: LEFT HEART CATHETERIZATION WITH CORONARY ANGIOGRAM;  Surgeon: Wellington Hampshire, MD;  Location: [facility];  Service: Cardiovascular;  Laterality: N/A;  . LUNG LOBECTOMY Left 2003   "lower lobe"  . PERIPHERAL VASCULAR CATHETERIZATION  05/01/2014   Procedure: RENAL INTERVENTION;  Surgeon: Wellington Hampshire, MD; 6 x 15 mm Herculink stent to the Left Renal Artery  . RENAL ANGIOGRAM  04/24/2014  . RENAL ANGIOGRAM N/A 04/24/2014   Procedure: RENAL ANGIOGRAM;  Surgeon: Wellington Hampshire, MD;  Location: [facility];  Service: Cardiovascular;  Laterality: N/A;  . RENAL ANGIOGRAM Left 05/01/2014   Procedure: RENAL ANGIOGRAM;  Surgeon: Wellington Hampshire, MD;  Location: [facility];  Service: Cardiovascular;  Laterality: Left;  . REPLACEMENT TOTAL  KNEE Left   . TOTAL HIP ARTHROPLASTY Right   . TUBAL LIGATION  ~ 1975  . VAGINAL DELIVERY  X 2  . VAGINAL HYSTERECTOMY  1980's     Current Outpatient Medications  Medication Sig Dispense Refill  . albuterol (PROVENTIL HFA;VENTOLIN HFA) 108 (90 Base) MCG/ACT inhaler Inhale 2 puffs into the lungs every 6 (six) hours as needed for wheezing or shortness of breath. 1 Inhaler 0  . Ascorbic Acid (VITAMIN C CR) 1500 MG TBCR Take by mouth daily.    Marland Kitchen aspirin 81 MG tablet Take 81 mg by mouth daily.    . B Complex Vitamins (VITAMIN B COMPLEX PO) Take by mouth.    . carvedilol (COREG) 12.5 MG tablet Take 1 tablet (12.5 mg total) by mouth 2 (two) times daily with a meal.  (Patient taking differently: Take 12.5 mg by mouth. ) 180 tablet 3  . Cholecalciferol 2000 UNITS TABS Take 1 tablet by mouth daily.    . hydrALAZINE (APRESOLINE) 25 MG tablet Take 1 tablet (25 mg total) by mouth 3 (three) times daily. (Patient taking differently: Take 25 mg by mouth as needed. ) 90 tablet 6  . losartan-hydrochlorothiazide (HYZAAR) 100-25 MG tablet Take 1 tablet by mouth daily. 90 tablet 0  . magnesium 30 MG tablet Take 30 mg 2 (two) times daily by mouth.    . traMADol (ULTRAM) 50 MG tablet Take 1 tablet (50 mg total) by mouth daily as needed. For back pain/joint pain 90 tablet 0   No current facility-administered medications for this visit.     Allergies:   Atorvastatin; Penicillins; Sulfa antibiotics; and Amlodipine    Social History:  The patient  reports that she has never smoked. She has never used smokeless tobacco. She reports that she drinks alcohol. She reports that she does not use drugs.   Family History:  The patient's family history includes Alzheimer's disease in her mother; Cancer in her paternal grandmother; Parkinsonism in her mother; Sarcoidosis in her brother; Stroke in her maternal grandmother.    ROS:  Please see the history of present illness.   Otherwise, review of systems are positive for none.   All other systems are reviewed and negative.    PHYSICAL EXAM: VS:  BP 110/68 (BP Location: Left Arm, Patient Position: Sitting, Cuff Size: Normal)   Pulse 70   Ht 5' (1.524 m)   Wt 127 lb (57.6 kg)   LMP  (LMP Unknown)   BMI 24.80 kg/m  , BMI Body mass index is 24.8 kg/m. GEN: Well nourished, well developed, in no acute distress  HEENT: normal  Neck: no JVD, carotid bruits, or masses Cardiac: RRR; no murmurs, rubs, or gallops,no edema  Respiratory:  clear to auscultation bilaterally, normal work of breathing GI: soft, nontender, nondistended, + BS MS: no deformity or atrophy  Skin: warm and dry, no rash Neuro:  Strength and sensation are  intact Psych: euthymic mood, full affect. She appears to be anxious.   EKG:  EKG is  ordered today. EKG showed normal sinus rhythm with left atrial enlargement.  No significant ST or T wave changes.   Recent Labs: 03/11/2017: ALT 14; BUN 21; Creatinine, Ser 0.82; Hemoglobin 13.5; Platelets 255.0; Potassium 4.2; Sodium 139; TSH 0.76    Lipid Panel    Component Value Date/Time   CHOL 239 (H) 03/11/2017 1010   CHOL 177 04/03/2014 0400   TRIG 94.0 03/11/2017 1010   TRIG 138 04/03/2014 0400   HDL 62.00 03/11/2017 1010  HDL 62 (H) 04/03/2014 0400   CHOLHDL 4 03/11/2017 1010   VLDL 18.8 03/11/2017 1010   VLDL 28 04/03/2014 0400   LDLCALC 158 (H) 03/11/2017 1010   LDLCALC 87 04/03/2014 0400   LDLDIRECT 201.7 02/05/2013 1050      Wt Readings from Last 3 Encounters:  06/30/17 127 lb (57.6 kg)  05/05/17 125 lb (56.7 kg)  03/11/17 123 lb 6 oz (56 kg)        ASSESSMENT AND PLAN:  1.   Status post Left renal artery stent placement for renovascular hypertension: Most recent renal artery duplex in November showed patent stent.  Blood pressure is well controlled.  2. Essential hypertension: Blood pressure is well controlled on Hyzaar and small dose carvedilol.  She has not required hydralazine lately.  3.  Moderate bilateral carotid disease: This has been stable on duplex and followed by [facility]  3. Hyperlipidemia:  She is intolerant to statins.  She stopped taking Zetia due to atypical leg pain.  Given her known atherosclerosis with renal artery stenosis as well as carotid disease, I discussed with her the option of treatment with a PC SK 9 inhibitor.   Most recent LDL was 158.  The patient wants to think about this before considering an injectable medication.   Disposition:   FU with me in 6  months  Signed,  Kathlyn Sacramento, MD  06/30/2017 8:29 AM    [place]

## 2017-06-30 NOTE — Patient Instructions (Signed)
Medication Instructions: Continue same medications.   Labwork: None.   Procedures/Testing: None.   Follow-Up: 6 months with Dr. Tyse Auriemma.   Any Additional Special Instructions Will Be Listed Below (If Applicable).     If you need a refill on your cardiac medications before your next appointment, please call your pharmacy.   

## 2017-07-11 ENCOUNTER — Ambulatory Visit (INDEPENDENT_AMBULATORY_CARE_PROVIDER_SITE_OTHER): Payer: Medicare HMO

## 2017-07-11 ENCOUNTER — Ambulatory Visit (INDEPENDENT_AMBULATORY_CARE_PROVIDER_SITE_OTHER): Payer: Medicare HMO | Admitting: Internal Medicine

## 2017-07-11 ENCOUNTER — Encounter: Payer: Self-pay | Admitting: Internal Medicine

## 2017-07-11 VITALS — BP 140/72 | HR 63 | Temp 98.3°F | Ht 60.0 in | Wt 128.6 lb

## 2017-07-11 DIAGNOSIS — R0781 Pleurodynia: Secondary | ICD-10-CM | POA: Diagnosis not present

## 2017-07-11 DIAGNOSIS — G8929 Other chronic pain: Secondary | ICD-10-CM | POA: Diagnosis not present

## 2017-07-11 DIAGNOSIS — E785 Hyperlipidemia, unspecified: Secondary | ICD-10-CM | POA: Diagnosis not present

## 2017-07-11 DIAGNOSIS — Z85118 Personal history of other malignant neoplasm of bronchus and lung: Secondary | ICD-10-CM

## 2017-07-11 DIAGNOSIS — R5383 Other fatigue: Secondary | ICD-10-CM

## 2017-07-11 DIAGNOSIS — R1012 Left upper quadrant pain: Secondary | ICD-10-CM | POA: Diagnosis not present

## 2017-07-11 DIAGNOSIS — M5441 Lumbago with sciatica, right side: Secondary | ICD-10-CM | POA: Diagnosis not present

## 2017-07-11 DIAGNOSIS — J90 Pleural effusion, not elsewhere classified: Secondary | ICD-10-CM

## 2017-07-11 DIAGNOSIS — I150 Renovascular hypertension: Secondary | ICD-10-CM

## 2017-07-11 NOTE — Patient Instructions (Addendum)
Try Allegra or Claritin or Zyrtec at night for itching skin We will order MRI low back and chest Xray and left rib Xray  Nizoral anti fungal shampoo for itching scalp    Cholesterol Cholesterol is a white, waxy, fat-like substance that is needed by the human body in small amounts. The liver makes all the cholesterol we need. Cholesterol is carried from the liver by the blood through the blood vessels. Deposits of cholesterol (plaques) may build up on blood vessel (artery) walls. Plaques make the arteries narrower and stiffer. Cholesterol plaques increase the risk for heart attack and stroke. You cannot feel your cholesterol level even if it is very high. The only way to know that it is high is to have a blood test. Once you know your cholesterol levels, you should keep a record of the test results. Work with your health care provider to keep your levels in the desired range. What do the results mean?  Total cholesterol is a rough measure of all the cholesterol in your blood.  LDL (low-density lipoprotein) is the "bad" cholesterol. This is the type that causes plaque to build up on the artery walls. You want this level to be low.  HDL (high-density lipoprotein) is the "good" cholesterol because it cleans the arteries and carries the LDL away. You want this level to be high.  Triglycerides are fat that the body can either burn for energy or store. High levels are closely linked to heart disease. What are the desired levels of cholesterol?  Total cholesterol below 200.  LDL below 100 for people who are at risk, below 70 for people at very high risk.  HDL above 40 is good. A level of 60 or higher is considered to be protective against heart disease.  Triglycerides below 150. How can I lower my cholesterol? Diet Follow your diet program as told by your health care provider.  Choose fish or white meat chicken and [place], roasted or baked. Limit fatty cuts of red meat, fried foods, and  processed meats, such as sausage and lunch meats.  Eat lots of fresh fruits and vegetables.  Choose whole grains, beans, pasta, potatoes, and cereals.  Choose olive oil, corn oil, or canola oil, and use only small amounts.  Avoid butter, mayonnaise, shortening, or palm kernel oils.  Avoid foods with trans fats.  Drink skim or nonfat milk and eat low-fat or nonfat yogurt and cheeses. Avoid whole milk, cream, ice cream, egg yolks, and full-fat cheeses.  Healthier desserts include angel food cake, ginger snaps, animal crackers, hard candy, popsicles, and low-fat or nonfat frozen yogurt. Avoid pastries, cakes, pies, and cookies.  Exercise  Follow your exercise program as told by your health care provider. A regular program: ? Helps to decrease LDL and raise HDL. ? Helps with weight control.  Do things that increase your activity level, such as gardening, walking, and taking the stairs.  Ask your health care provider about ways that you can be more active in your daily life.  Medicine  Take over-the-counter and prescription medicines only as told by your health care provider. ? Medicine may be prescribed by your health care provider to help lower cholesterol and decrease the risk for heart disease. This is usually done if diet and exercise have failed to bring down cholesterol levels. ? If you have several risk factors, you may need medicine even if your levels are normal.  This information is not intended to replace advice given to you by your  health care provider. Make sure you discuss any questions you have with your health care provider. Document Released: 12/15/2000 Document Revised: 10/18/2015 Document Reviewed: 09/20/2015 Elsevier Interactive Patient Education  [employer].

## 2017-07-11 NOTE — Progress Notes (Signed)
Chief Complaint  Patient presents with  . Follow-up   F/u  1. C/o fatigue  2. C/o with walking legs get weak b/l legs. H/o low back pain that radiates to right thigh/hip though has only had to take 1 tramadol since last visit  3. hld uncontrolled on red yeast rice per pt 1200 mg bid, zetia 10 mg qd  4. C/o left upper quadrant abdominal pain new w/in the last 1 month wakes her up at night she did have a fall w/in the last couple months mechanical but did not land on her abdomen. She cant describe feeling though feels like a pulled muscle and lying on right side worse pain 5. Renovascular HTN Dr. Fletcher Anon cut coreg down to 3.125 mg bid cutting pill in 1/4 BP controlled today also on hyzaar 100-25 and prn hydralazine   Review of Systems  Constitutional: Negative for weight loss.  HENT: Negative for hearing loss.   Eyes: Negative for blurred vision.  Respiratory: Negative for shortness of breath.   Cardiovascular: Negative for chest pain.  Gastrointestinal: Positive for abdominal pain.  Musculoskeletal: Positive for back pain.  Skin: Negative for rash.  Neurological: Negative for headaches.  Psychiatric/Behavioral: Negative for memory loss.   Past Medical History:  Diagnosis Date  . Arthritis    "qwhere"  . Carotid artery disease ([place])   . Chronic lower back pain   . Colon polyps    benign per pt   . Epistaxis    with use of nasal sprays   . Headache    "lately; related to HTN" (04/24/2014)  . Hyperlipidemia   . Hypertension   . Hyponatremia    h/o  . Kidney stone   . Lung cancer ([place]) 2003   s/p left lower lobe resection  . Non-obstructive CAD    a. 04/2014 Cath: LM nl, LAD/D1/LCX/RCA min irregs, RPDA/PAV nl.  . Orthostatic hypotension   . OSA on CPAP    "wear it intermittently"  . PONV (postoperative nausea and vomiting)    "when I was younger"  . Renal artery stenosis ([place])    a. 04/2014 Renal angiography: LRA 95p, RRA 40ost. PTA of LRA deferred 2/2 guide cath induced Ao  dissection;  01/27 6 x 15 mm Herculink stent to the Left-RA  follows with [facility] Dr. Ronalee Belts  . Sinus headache    Past Surgical History:  Procedure Laterality Date  . APPENDECTOMY    . BILATERAL OOPHORECTOMY Bilateral 2000's  . CARDIAC CATHETERIZATION  04/24/2014  . CATARACT EXTRACTION W/ INTRAOCULAR LENS  IMPLANT, BILATERAL Bilateral   . JOINT REPLACEMENT    . LEFT HEART CATHETERIZATION WITH CORONARY ANGIOGRAM N/A 04/24/2014   Procedure: LEFT HEART CATHETERIZATION WITH CORONARY ANGIOGRAM;  Surgeon: Wellington Hampshire, MD;  Location: [facility] C[facility];  Service: Cardiovascular;  Laterality: N/A;  . LUNG LOBECTOMY Left 2003   "lower lobe"  . PERIPHERAL VASCULAR CATHETERIZATION  05/01/2014   Procedure: RENAL INTERVENTION;  Surgeon: Wellington Hampshire, MD; 6 x 15 mm Herculink stent to the Left Renal Artery  . RENAL ANGIOGRAM  04/24/2014  . RENAL ANGIOGRAM N/A 04/24/2014   Procedure: RENAL ANGIOGRAM;  Surgeon: Wellington Hampshire, MD;  Location: [facility];  Service: Cardiovascular;  Laterality: N/A;  . RENAL ANGIOGRAM Left 05/01/2014   Procedure: RENAL ANGIOGRAM;  Surgeon: Wellington Hampshire, MD;  Location: [facility];  Service: Cardiovascular;  Laterality: Left;  . REPLACEMENT TOTAL KNEE Left   . TOTAL HIP ARTHROPLASTY Right   . TUBAL  LIGATION  ~ 1975  . VAGINAL DELIVERY  X 2  . VAGINAL HYSTERECTOMY  1980's   Family History  Problem Relation Age of Onset  . Parkinsonism Mother   . Alzheimer's disease Mother   . Sarcoidosis Brother   . Stroke Maternal Grandmother   . Cancer Paternal Grandmother    Social History   Socioeconomic History  . Marital status: Widowed    Spouse name: Not on file  . Number of children: Not on file  . Years of education: Not on file  . Highest education level: Not on file  Occupational History  . Not on file  Social Needs  . Financial resource strain: Not on file  . Food insecurity:    Worry: Not on file    Inability: Not on file  . Transportation needs:     Medical: Not on file    Non-medical: Not on file  Tobacco Use  . Smoking status: Never Smoker  . Smokeless tobacco: Never Used  . Tobacco comment: Father and husband both smokers  Substance and Sexual Activity  . Alcohol use: Yes    Comment: 04/24/2014 "glass of beer or wine q couple weeks"  . Drug use: No  . Sexual activity: Not on file  Lifestyle  . Physical activity:    Days per week: Not on file    Minutes per session: Not on file  . Stress: Not on file  Relationships  . Social connections:    Talks on phone: Not on file    Gets together: Not on file    Attends religious service: Not on file    Active member of club or organization: Not on file    Attends meetings of clubs or organizations: Not on file    Relationship status: Not on file  . Intimate partner violence:    Fear of current or ex partner: Not on file    Emotionally abused: Not on file    Physically abused: Not on file    Forced sexual activity: Not on file  Other Topics Concern  . Not on file  Social History Narrative   Lives in [place]. Daughter nearby. Used to live in [place]   Normal ADLs as of 02/2017 and no h/o falls    Likes to walk   Lives alone    Current Meds  Medication Sig  . albuterol (PROVENTIL HFA;VENTOLIN HFA) 108 (90 Base) MCG/ACT inhaler Inhale 2 puffs into the lungs every 6 (six) hours as needed for wheezing or shortness of breath.  . Ascorbic Acid (VITAMIN C CR) 1500 MG TBCR Take by mouth daily.  Marland Kitchen aspirin 81 MG tablet Take 81 mg by mouth daily.  . B Complex Vitamins (VITAMIN B COMPLEX PO) Take by mouth.  . carvedilol (COREG) 12.5 MG tablet Take 1 tablet (12.5 mg total) by mouth 2 (two) times daily with a meal. (Patient taking differently: Take 12.5 mg by mouth. )  . Cholecalciferol 2000 UNITS TABS Take 1 tablet by mouth daily.  Marland Kitchen ezetimibe (ZETIA) 10 MG tablet Take 10 mg by mouth daily.  . hydrALAZINE (APRESOLINE) 25 MG tablet Take 1 tablet (25 mg total) by mouth 3 (three)  times daily. (Patient taking differently: Take 25 mg by mouth as needed. )  . losartan-hydrochlorothiazide (HYZAAR) 100-25 MG tablet Take 1 tablet by mouth daily.  . magnesium 30 MG tablet Take 30 mg 2 (two) times daily by mouth.  . Red Yeast Rice 600 MG CAPS Take by mouth  2 (two) times daily.  . traMADol (ULTRAM) 50 MG tablet Take 1 tablet (50 mg total) by mouth daily as needed. For back pain/joint pain   Allergies  Allergen Reactions  . Atorvastatin Other (See Comments)    Muscle weakness  . Penicillins Hives  . Sulfa Antibiotics Hives  . Amlodipine     Weakness and confusion.    No results found for this or any previous visit (from the past 2160 hour(s)). Objective  Body mass index is 25.12 kg/m. Wt Readings from Last 3 Encounters:  07/11/17 128 lb 9.6 oz (58.3 kg)  06/30/17 127 lb (57.6 kg)  05/05/17 125 lb (56.7 kg)   Temp Readings from Last 3 Encounters:  07/11/17 98.3 F (36.8 C) (Oral)  05/05/17 98.6 F (37 C) (Oral)  03/11/17 98 F (36.7 C) (Oral)   BP Readings from Last 3 Encounters:  07/11/17 140/72  06/30/17 110/68  05/05/17 130/62   Pulse Readings from Last 3 Encounters:  07/11/17 63  06/30/17 70  05/05/17 72    Physical Exam  Constitutional: She is oriented to person, place, and time. Vital signs are normal. She appears well-developed and well-nourished.  HENT:  Head: Normocephalic and atraumatic.  Mouth/Throat: Oropharynx is clear and moist and mucous membranes are normal.  Eyes: Pupils are equal, round, and reactive to light. Conjunctivae are normal.  Cardiovascular: Normal rate, regular rhythm and normal heart sounds.  Pulmonary/Chest: Effort normal and breath sounds normal.  Abdominal: Soft. Bowel sounds are normal. There is tenderness in the left upper quadrant.    Neurological: She is alert and oriented to person, place, and time. Gait normal.  Skin: Skin is warm, dry and intact.  Psychiatric: She has a normal mood and affect. Her speech is  normal and behavior is normal. Judgment and thought content normal. Cognition and memory are normal.  Nursing note and vitals reviewed.   Assessment   1. Fatigue  2. Low back pain w/ right radiculopathy  3. HLD uncontrolled, h/o renovascular htn controlled  4. LUQ abdominal pain ? Rib fracture, h/o lung cancer s/p lobectomy as well  -reviewed [place] ab 02/2017 no etiology present  -CXR today left pleural effusion 5. HM Plan  1.  Will check CMET, CBC, thyroid labs normal 03/2017  2. MRI low back w/o contrast  Prn Tramadol  3. On red yeast rice 1200 mg bid per pt and zetia 10 mg qd  Does not want to do repatha at this time  Check lipid early 09/2017   On coreg 3.125 mg bid, prn Hydralazine, hyzaar 100-25 mg qd  4. CXR and left rib Xray today +moderate left pleural effusion  Stat echo and CT chest with h/o lung cancer   5.  UTD vaccines consider shingrix in future  Labs today CMET, [facility] at [facility]   cologaurd neg 04/19/17 reviewed with pt today  Out of age window pap, mammo though will disc mammo future   DEXA T score neck -1.5 to -2.4 06/2010 may want to repeat in future   Consider CT chest future h/o lung cancer. CT chest 04/18/14 mild scarring and s/p left lower lobectomy h/o lung cancer, monitor  -see above   Disc oral antihistamine OTC for itching skin and nizoral shampoo for itching scalp   Provider: Dr. Olivia Mackie McLean-Scocuzza-Internal Medicine

## 2017-07-11 NOTE — Progress Notes (Signed)
Pre visit review using our clinic review tool, if applicable. No additional management support is needed unless otherwise documented below in the visit note. 

## 2017-07-14 ENCOUNTER — Encounter (INDEPENDENT_AMBULATORY_CARE_PROVIDER_SITE_OTHER): Payer: Self-pay

## 2017-07-15 ENCOUNTER — Encounter: Payer: Self-pay | Admitting: Internal Medicine

## 2017-07-15 ENCOUNTER — Ambulatory Visit
Admission: RE | Admit: 2017-07-15 | Discharge: 2017-07-15 | Disposition: A | Discharge status: Home / Self Care | Payer: Medicare HMO | Source: Ambulatory Visit | Attending: Internal Medicine | Admitting: Internal Medicine

## 2017-07-15 ENCOUNTER — Other Ambulatory Visit: Payer: Self-pay | Admitting: Internal Medicine

## 2017-07-15 DIAGNOSIS — Z85118 Personal history of other malignant neoplasm of bronchus and lung: Secondary | ICD-10-CM | POA: Diagnosis not present

## 2017-07-15 DIAGNOSIS — I6529 Occlusion and stenosis of unspecified carotid artery: Secondary | ICD-10-CM | POA: Diagnosis not present

## 2017-07-15 DIAGNOSIS — G4733 Obstructive sleep apnea (adult) (pediatric): Secondary | ICD-10-CM | POA: Insufficient documentation

## 2017-07-15 DIAGNOSIS — I999 Unspecified disorder of circulatory system: Secondary | ICD-10-CM | POA: Diagnosis not present

## 2017-07-15 DIAGNOSIS — J90 Pleural effusion, not elsewhere classified: Secondary | ICD-10-CM | POA: Diagnosis not present

## 2017-07-15 DIAGNOSIS — I10 Essential (primary) hypertension: Secondary | ICD-10-CM | POA: Diagnosis not present

## 2017-07-15 DIAGNOSIS — I700 Atherosclerosis of aorta: Secondary | ICD-10-CM | POA: Insufficient documentation

## 2017-07-15 DIAGNOSIS — J9811 Atelectasis: Secondary | ICD-10-CM | POA: Insufficient documentation

## 2017-07-15 DIAGNOSIS — E785 Hyperlipidemia, unspecified: Secondary | ICD-10-CM | POA: Insufficient documentation

## 2017-07-15 DIAGNOSIS — R918 Other nonspecific abnormal finding of lung field: Secondary | ICD-10-CM | POA: Insufficient documentation

## 2017-07-15 DIAGNOSIS — I2510 Atherosclerotic heart disease of native coronary artery without angina pectoris: Secondary | ICD-10-CM | POA: Insufficient documentation

## 2017-07-15 DIAGNOSIS — R5383 Other fatigue: Secondary | ICD-10-CM

## 2017-07-15 DIAGNOSIS — I081 Rheumatic disorders of both mitral and tricuspid valves: Secondary | ICD-10-CM | POA: Diagnosis not present

## 2017-07-15 LAB — CBC WITH DIFFERENTIAL/PLATELET
BASOS PCT: 1 %
Basophils Absolute: 0.1 10*3/uL (ref 0–0.1)
EOS ABS: 0.1 10*3/uL (ref 0–0.7)
Eosinophils Relative: 1 %
HCT: 39.8 % (ref 35.0–47.0)
HEMOGLOBIN: 13.5 g/dL (ref 12.0–16.0)
Lymphocytes Relative: 23 %
Lymphs Abs: 1.3 10*3/uL (ref 1.0–3.6)
MCH: 29.6 pg (ref 26.0–34.0)
MCHC: 33.9 g/dL (ref 32.0–36.0)
MCV: 87.5 fL (ref 80.0–100.0)
Monocytes Absolute: 0.5 10*3/uL (ref 0.2–0.9)
Monocytes Relative: 8 %
NEUTROS PCT: 67 %
Neutro Abs: 3.8 10*3/uL (ref 1.4–6.5)
Platelets: 240 10*3/uL (ref 150–440)
RBC: 4.55 MIL/uL (ref 3.80–5.20)
RDW: 13 % (ref 11.5–14.5)
WBC: 5.7 10*3/uL (ref 3.6–11.0)

## 2017-07-15 LAB — COMPREHENSIVE METABOLIC PANEL
ALBUMIN: 4.1 g/dL (ref 3.5–5.0)
ALK PHOS: 100 U/L (ref 38–126)
ALT: 17 U/L (ref 14–54)
ANION GAP: 8 (ref 5–15)
AST: 25 U/L (ref 15–41)
BUN: 18 mg/dL (ref 6–20)
CALCIUM: 9.1 mg/dL (ref 8.9–10.3)
CHLORIDE: 100 mmol/L — AB (ref 101–111)
CO2: 29 mmol/L (ref 22–32)
Creatinine, Ser: 0.66 mg/dL (ref 0.44–1.00)
GFR calc Af Amer: >60 mL/min (ref >60)
GFR calc non Af Amer: >60 mL/min (ref >60)
GLUCOSE: 106 mg/dL — AB (ref 65–99)
Potassium: 3.5 mmol/L (ref 3.5–5.1)
SODIUM: 137 mmol/L (ref 135–145)
Total Bilirubin: 0.9 mg/dL (ref 0.3–1.2)
Total Protein: 6.9 g/dL (ref 6.5–8.1)

## 2017-07-15 LAB — POCT I-STAT CREATININE: Creatinine, Ser: 0.8 mg/dL (ref 0.44–1.00)

## 2017-07-15 MED ORDER — IOHEXOL 300 MG/ML  SOLN
75.0000 mL | Freq: Once | INTRAMUSCULAR | Status: AC | PRN
  Administered 2017-07-15: 75 mL via INTRAVENOUS

## 2017-07-15 NOTE — Progress Notes (Signed)
*  PRELIMINARY RESULTS* Echocardiogram 2D Echocardiogram has been performed.  Sherrie Sport 07/15/2017, 11:43 AM

## 2017-07-26 ENCOUNTER — Ambulatory Visit: Payer: Medicare HMO

## 2017-07-26 ENCOUNTER — Ambulatory Visit
Admission: RE | Admit: 2017-07-26 | Discharge: 2017-07-26 | Disposition: A | Discharge status: Home / Self Care | Payer: Medicare HMO | Source: Ambulatory Visit | Attending: Internal Medicine | Admitting: Internal Medicine

## 2017-07-26 DIAGNOSIS — M545 Low back pain: Secondary | ICD-10-CM | POA: Diagnosis not present

## 2017-07-26 DIAGNOSIS — M5136 Other intervertebral disc degeneration, lumbar region: Secondary | ICD-10-CM | POA: Diagnosis not present

## 2017-07-26 DIAGNOSIS — G8929 Other chronic pain: Secondary | ICD-10-CM | POA: Diagnosis not present

## 2017-07-26 DIAGNOSIS — M1288 Other specific arthropathies, not elsewhere classified, other specified site: Secondary | ICD-10-CM | POA: Diagnosis not present

## 2017-07-26 DIAGNOSIS — M4807 Spinal stenosis, lumbosacral region: Secondary | ICD-10-CM | POA: Insufficient documentation

## 2017-07-26 DIAGNOSIS — M5441 Lumbago with sciatica, right side: Secondary | ICD-10-CM | POA: Diagnosis not present

## 2017-07-26 DIAGNOSIS — M79604 Pain in right leg: Secondary | ICD-10-CM | POA: Diagnosis not present

## 2017-07-29 ENCOUNTER — Telehealth: Payer: Self-pay | Admitting: *Deleted

## 2017-07-29 NOTE — Telephone Encounter (Signed)
FYI

## 2017-07-29 NOTE — Telephone Encounter (Signed)
Copied from [place] [id number]. Topic: Complaint - Care >> Jul 29, 2017  8:45 AM Margot Ables wrote: Reason for CRM: received call from [facility] to f/u on referral to Pulmonary. She states that pt has not been contacted by anyone and it has been 2 weeks. I advised that the referral had been placed to [facility] [place] Dr. Alva Garnet. Per referral documentation they have attempted to contact pt 1x on 07/20/17. I did notify [name] of this. She states that pt has called previously and was not notified of this.  She said "I'm sorry, but I don't believe that." She said the patient is very "with it" and would remember if she had received a call or message. I apologized for any mix ups and that all I can go on is what has been documented. I provided her with the phone # for [facility] [place]. She is to contact them regarding scheduling. [name] stated she will be contacting office manager regarding complaint/concern for patients care.

## 2017-08-01 ENCOUNTER — Telehealth: Payer: Self-pay

## 2017-08-01 NOTE — Telephone Encounter (Signed)
Copied from [place] [id number]. Topic: Complaint - Care >> Jul 29, 2017  8:45 AM Margot Ables wrote: Reason for CRM: received call from [facility] to f/u on referral to Pulmonary. She states that pt has not been contacted by anyone and it has been 2 weeks. I advised that the referral had been placed to [facility] [place] Dr. Alva Garnet. Per referral documentation they have attempted to contact pt 1x on 07/20/17. I did notify [name] of this. She states that pt has called previously and was not notified of this.  She said "I'm sorry, but I don't believe that." She said the patient is very "with it" and would remember if she had received a call or message. I apologized for any mix ups and that all I can go on is what has been documented. I provided her with the phone # for [facility] [place]. She is to contact them regarding scheduling. [name] stated she will be contacting office manager regarding complaint/concern for patients care.

## 2017-08-10 ENCOUNTER — Telehealth: Payer: Self-pay | Admitting: Pulmonary Disease

## 2017-08-10 ENCOUNTER — Encounter: Payer: Self-pay | Admitting: Pulmonary Disease

## 2017-08-10 ENCOUNTER — Ambulatory Visit: Payer: Medicare HMO | Admitting: Pulmonary Disease

## 2017-08-10 VITALS — BP 158/78 | HR 95 | Resp 16 | Ht 60.0 in | Wt 124.0 lb

## 2017-08-10 DIAGNOSIS — R918 Other nonspecific abnormal finding of lung field: Secondary | ICD-10-CM

## 2017-08-10 DIAGNOSIS — J90 Pleural effusion, not elsewhere classified: Secondary | ICD-10-CM

## 2017-08-10 DIAGNOSIS — Z85118 Personal history of other malignant neoplasm of bronchus and lung: Secondary | ICD-10-CM | POA: Diagnosis not present

## 2017-08-10 NOTE — Progress Notes (Signed)
tumor °

## 2017-08-10 NOTE — Patient Instructions (Signed)
PET scan has been ordered  Further decisions regarding diagnostic evaluation will be dependent on the findings of the PET scan and discussion at [facility] will be determined at a later date

## 2017-08-10 NOTE — Telephone Encounter (Signed)
Made in error

## 2017-08-10 NOTE — Progress Notes (Signed)
PULMONARY CONSULT NOTE  Requesting MD/Service: McLean-Scocuzza, Nino Glow, MD Date of initial consultation: 08/10/17 Reason for consultation: history of lung cancer, status post LLL resection 2003, new finding of left-sided pleural based nodules with small left effusion  PT PROFILE: 84 y.o. female never smoker referred for the above  HPI:  As above.  She initially presented with this current problem in late February or early March of this year.  Her chief complaint was left-sided anterior inferior thoracic pain.  She describes the pain as feeling like a "pulled muscle".  She believes the pain has improved somewhat since its onset.  She has chronic back pain and wears a back brace which improves her thoracic pain when cinched up.  She denies shortness of breath.  She has minimal cough which is nonproductive.  She denies hemoptysis and weight loss.  She does report increased fatigue and mild increased lower extremity weakness.  She has had no fevers.  Overall, she is a notably vigorous 84 year old woman.  She did undergo left lower lobectomy for lung cancer in 2003.  The exact type of lung cancer is unknown.  She never underwent chemotherapy or radiation therapy.  At her baseline, her exercise tolerance is excellent.  She walks vigorously for exercise on a daily basis.  Past Medical History:  Diagnosis Date  . Arthritis    "qwhere"  . Carotid artery disease ([place])   . Chronic lower back pain   . Colon polyps    benign per pt   . Epistaxis    with use of nasal sprays   . Headache    "lately; related to HTN" (04/24/2014)  . Hyperlipidemia   . Hypertension   . Hyponatremia    h/o  . Kidney stone   . Lung cancer ([place]) 2003   s/p left lower lobe resection  . Non-obstructive CAD    a. 04/2014 Cath: LM nl, LAD/D1/LCX/RCA min irregs, RPDA/PAV nl.  . Orthostatic hypotension   . OSA on CPAP    "wear it intermittently"  . PONV (postoperative nausea and vomiting)    "when I was younger"  .  Renal artery stenosis ([place])    a. 04/2014 Renal angiography: LRA 95p, RRA 40ost. PTA of LRA deferred 2/2 guide cath induced Ao dissection;  01/27 6 x 15 mm Herculink stent to the Left-RA  follows with [facility] Dr. Ronalee Belts  . Sinus headache     Past Surgical History:  Procedure Laterality Date  . APPENDECTOMY    . BILATERAL OOPHORECTOMY Bilateral 2000's  . CARDIAC CATHETERIZATION  04/24/2014  . CATARACT EXTRACTION W/ INTRAOCULAR LENS  IMPLANT, BILATERAL Bilateral   . JOINT REPLACEMENT    . LEFT HEART CATHETERIZATION WITH CORONARY ANGIOGRAM N/A 04/24/2014   Procedure: LEFT HEART CATHETERIZATION WITH CORONARY ANGIOGRAM;  Surgeon: Wellington Hampshire, MD;  Location: [facility];  Service: Cardiovascular;  Laterality: N/A;  . LUNG LOBECTOMY Left 2003   "lower lobe"  . PERIPHERAL VASCULAR CATHETERIZATION  05/01/2014   Procedure: RENAL INTERVENTION;  Surgeon: Wellington Hampshire, MD; 6 x 15 mm Herculink stent to the Left Renal Artery  . RENAL ANGIOGRAM  04/24/2014  . RENAL ANGIOGRAM N/A 04/24/2014   Procedure: RENAL ANGIOGRAM;  Surgeon: Wellington Hampshire, MD;  Location: [facility];  Service: Cardiovascular;  Laterality: N/A;  . RENAL ANGIOGRAM Left 05/01/2014   Procedure: RENAL ANGIOGRAM;  Surgeon: Wellington Hampshire, MD;  Location: [facility];  Service: Cardiovascular;  Laterality: Left;  . REPLACEMENT TOTAL KNEE Left   .  TOTAL HIP ARTHROPLASTY Right   . TUBAL LIGATION  ~ 1975  . VAGINAL DELIVERY  X 2  . VAGINAL HYSTERECTOMY  1980's    MEDICATIONS: I have reviewed all medications and confirmed regimen as documented  Social History   Socioeconomic History  . Marital status: Widowed    Spouse name: Not on file  . Number of children: Not on file  . Years of education: Not on file  . Highest education level: Not on file  Occupational History  . Not on file  Social Needs  . Financial resource strain: Not on file  . Food insecurity:    Worry: Not on file    Inability: Not on file  . Transportation  needs:    Medical: Not on file    Non-medical: Not on file  Tobacco Use  . Smoking status: Never Smoker  . Smokeless tobacco: Never Used  . Tobacco comment: Father and husband both smokers  Substance and Sexual Activity  . Alcohol use: Yes    Comment: 04/24/2014 "glass of beer or wine q couple weeks"  . Drug use: No  . Sexual activity: Not on file  Lifestyle  . Physical activity:    Days per week: Not on file    Minutes per session: Not on file  . Stress: Not on file  Relationships  . Social connections:    Talks on phone: Not on file    Gets together: Not on file    Attends religious service: Not on file    Active member of club or organization: Not on file    Attends meetings of clubs or organizations: Not on file    Relationship status: Not on file  . Intimate partner violence:    Fear of current or ex partner: Not on file    Emotionally abused: Not on file    Physically abused: Not on file    Forced sexual activity: Not on file  Other Topics Concern  . Not on file  Social History Narrative   Lives in [place]. Daughter nearby. Used to live in [place]   Normal ADLs as of 02/2017 and no h/o falls    Likes to walk   Lives alone     Family History  Problem Relation Age of Onset  . Parkinsonism Mother   . Alzheimer's disease Mother   . Sarcoidosis Brother   . Stroke Maternal Grandmother   . Cancer Paternal Grandmother     ROS: No fever, myalgias/arthralgias, unexplained weight loss or weight gain No new focal weakness or sensory deficits No otalgia, hearing loss, visual changes, nasal and sinus symptoms, mouth and throat problems No neck pain or adenopathy No abdominal pain, N/V/D, diarrhea, change in bowel pattern No dysuria, change in urinary pattern   Vitals:   08/10/17 1414 08/10/17 1415  BP:  (!) 158/78  Pulse:  95  Resp: 16   SpO2:  100%  Weight: 124 lb (56.2 kg)   Height: 5' (1.524 m)      EXAM:  Gen: NAD HEENT: NCAT, sclera  white Neck: No JVD Lungs: breath sounds full, no wheezes or other adventitious sounds Cardiovascular: RRR, no murmurs Abdomen: Soft, nontender, normal BS Ext: without clubbing, cyanosis, edema Neuro: grossly intact Skin: Limited exam, no lesions noted   DATA:   BMP Latest Ref Rng & Units 07/15/2017 07/15/2017 03/11/2017  Glucose 65 - 99 mg/dL 106(H) - 99  BUN 6 - 20 mg/dL 18 - 21  Creatinine 0.44 -  1.00 mg/dL 0.66 0.80 0.82  BUN/Creat Ratio 11 - 26 - - -  Sodium 135 - 145 mmol/L 137 - 139  Potassium 3.5 - 5.1 mmol/L 3.5 - 4.2  Chloride 101 - 111 mmol/L 100(L) - 100  CO2 22 - 32 mmol/L 29 - 31  Calcium 8.9 - 10.3 mg/dL 9.1 - 9.2    CBC Latest Ref Rng & Units 07/15/2017 03/11/2017 02/20/2016  WBC 3.6 - 11.0 K/uL 5.7 6.4 6.3  Hemoglobin 12.0 - 16.0 g/dL 13.5 13.5 12.7  Hematocrit 35.0 - 47.0 % 39.8 41.1 36.9  Platelets 150 - 440 K/uL 240 255.0 224.0    CXR 07/11/2017: Moderate-sized left-sided pleural effusion CT chest 07/15/2017: Wrist operative changes of the left hemithorax.  New findings compared to CT chest 04/18/2014 include small, partially loculated left pleural effusion, generalized thickening of left visceral pleura, multiple pleural-based densities and nodules  I have personally reviewed all chest radiographs reported above including CXRs and CT chest unless otherwise indicated  IMPRESSION:     ICD-10-CM   1. History of lung cancer, status post lobectomy Z85.118 NM PET Image Initial (PI) Skull Base To Thigh    CHL ONCBCN GENERAL TUMOR BOARD  2. Pleural effusion, left J90   3. Pleural based L sided pulmonary nodules  R91.8 NM PET Image Initial (PI) Skull Base To Thigh    CHL ONCBCN GENERAL TUMOR BOARD     PLAN:  I have reviewed the CT scan in the office with the patient and a friend who accompanied her.  We discussed the potential diagnoses and I expressed my concern that this could represent recurrent or a new malignancy  As recommended by radiologist who reviewed  the above CT scan, a PET scan has been ordered  She will be discussed at [facility].  Further diagnostic evaluation could include biopsy of pleural-based nodules versus thoracentesis versus VATS biopsy versus watchful waiting  Follow-up will be arranged after the above   Merton Border, MD PCCM service Mobile [phone] Pager [phone] 08/10/2017 4:08 PM

## 2017-08-16 ENCOUNTER — Ambulatory Visit
Admission: RE | Admit: 2017-08-16 | Discharge: 2017-08-16 | Disposition: A | Discharge status: Home / Self Care | Payer: Medicare HMO | Source: Ambulatory Visit | Attending: Pulmonary Disease | Admitting: Pulmonary Disease

## 2017-08-16 DIAGNOSIS — I2510 Atherosclerotic heart disease of native coronary artery without angina pectoris: Secondary | ICD-10-CM | POA: Insufficient documentation

## 2017-08-16 DIAGNOSIS — I700 Atherosclerosis of aorta: Secondary | ICD-10-CM | POA: Insufficient documentation

## 2017-08-16 DIAGNOSIS — N200 Calculus of kidney: Secondary | ICD-10-CM | POA: Diagnosis not present

## 2017-08-16 DIAGNOSIS — Z85118 Personal history of other malignant neoplasm of bronchus and lung: Secondary | ICD-10-CM

## 2017-08-16 DIAGNOSIS — R911 Solitary pulmonary nodule: Secondary | ICD-10-CM | POA: Diagnosis not present

## 2017-08-16 DIAGNOSIS — R918 Other nonspecific abnormal finding of lung field: Secondary | ICD-10-CM | POA: Diagnosis not present

## 2017-08-16 DIAGNOSIS — J439 Emphysema, unspecified: Secondary | ICD-10-CM | POA: Insufficient documentation

## 2017-08-16 LAB — GLUCOSE, CAPILLARY: Glucose-Capillary: 82 mg/dL (ref 65–99)

## 2017-08-16 MED ORDER — FLUDEOXYGLUCOSE F - 18 (FDG) INJECTION
6.3100 | Freq: Once | INTRAVENOUS | Status: AC | PRN
  Administered 2017-08-16: 6.31 via INTRAVENOUS

## 2017-08-18 ENCOUNTER — Encounter: Payer: Self-pay | Admitting: Pulmonary Disease

## 2017-08-22 ENCOUNTER — Other Ambulatory Visit: Payer: Self-pay | Admitting: *Deleted

## 2017-08-22 ENCOUNTER — Encounter: Payer: Self-pay | Admitting: *Deleted

## 2017-08-22 DIAGNOSIS — R222 Localized swelling, mass and lump, trunk: Secondary | ICD-10-CM

## 2017-08-22 NOTE — Progress Notes (Signed)
  Oncology Nurse Navigator Documentation  Navigator Location: [facility] (08/22/17 1400)   )Navigator Encounter Type: Introductory phone call (08/22/17 1400)   Abnormal Finding Date: 07/11/17 (08/22/17 1400)                   Treatment Phase: Abnormal Scans (08/22/17 1400) Barriers/Navigation Needs: Coordination of Care (08/22/17 1400)   Interventions: Coordination of Care (08/22/17 1400)   Coordination of Care: Appts;Radiology (08/22/17 1400)        Acuity: Level 2 (08/22/17 1400)   Acuity Level 2: Initial guidance, education and coordination as needed;Educational needs;Assistance expediting appointments (08/22/17 1400)    phone call made to patient to introduce to navigator services. Reviewed with pt recommendations from conference last week. Informed that will need to schedule biopsy and oncology consult after biopsy. Pt informed that orders have been placed today and will be notified once scheduled for biopsy and oncology consult. Pt verbalized understanding. Contact info given and encouraged to call with any further questions or concerns. Nothing further needed at this time.  Time Spent with Patient: 30 (08/22/17 1400)

## 2017-08-23 ENCOUNTER — Ambulatory Visit (INDEPENDENT_AMBULATORY_CARE_PROVIDER_SITE_OTHER): Payer: Medicare HMO

## 2017-08-23 VITALS — BP 126/62 | HR 64 | Temp 97.9°F | Resp 12 | Ht 59.0 in | Wt 124.8 lb

## 2017-08-23 DIAGNOSIS — Z0000 Encounter for general adult medical examination without abnormal findings: Secondary | ICD-10-CM | POA: Diagnosis not present

## 2017-08-23 NOTE — Progress Notes (Signed)
Subjective:   Tamara Burton is a 84 y.o. female who presents for Medicare Annual (Subsequent) preventive examination.  Review of Systems:  No ROS.  Medicare Wellness Visit. Additional risk factors are reflected in the social history.  Cardiac Risk Factors include: advanced age (>36men, >70 women);hypertension     Objective:     Vitals: BP 126/62 (BP Location: Left Arm, Patient Position: Sitting, Cuff Size: Normal)   Pulse 64   Temp 97.9 F (36.6 C) (Oral)   Resp 12   Ht 4\' 11"  (1.499 m)   Wt 124 lb 12.8 oz (56.6 kg)   LMP  (LMP Unknown)   SpO2 97%   BMI 25.21 kg/m   Body mass index is 25.21 kg/m.  Advanced Directives 08/23/2017 02/02/2017 05/01/2014 04/24/2014 04/18/2014  Does Patient Have a Medical Advance Directive? Yes Yes Yes Yes No  Type of Paramedic of [place];Living will [place];Living will Healthcare Power of [place] -  Does patient want to make changes to medical advance directive? No - Patient declined - No - Patient declined No - Patient declined -  Copy of [place] in Chart? No - copy requested - No - copy requested No - copy requested -    Tobacco Social History   Tobacco Use  Smoking Status Never Smoker  Smokeless Tobacco Never Used  Tobacco Comment   Father and husband both smokers     Counseling given: Not Answered Comment: Father and husband both smokers   Clinical Intake:  Pre-visit preparation completed: Yes  Pain : No/denies pain     Nutritional Status: BMI of 19-24  Normal Diabetes: No  How often do you need to have someone help you when you read instructions, pamphlets, or other written materials from your doctor or pharmacy?: 1 - Never  Interpreter Needed?: No     Past Medical History:  Diagnosis Date  . Arthritis    "qwhere"  . Carotid artery disease ([place])   . Chronic lower back pain   . Colon polyps    benign per pt   .  Epistaxis    with use of nasal sprays   . Headache    "lately; related to HTN" (04/24/2014)  . Hyperlipidemia   . Hypertension   . Hyponatremia    h/o  . Kidney stone   . Lung cancer ([place]) 2003   s/p left lower lobe resection  . Non-obstructive CAD    a. 04/2014 Cath: LM nl, LAD/D1/LCX/RCA min irregs, RPDA/PAV nl.  . Orthostatic hypotension   . OSA on CPAP    "wear it intermittently"  . PONV (postoperative nausea and vomiting)    "when I was younger"  . Renal artery stenosis ([place])    a. 04/2014 Renal angiography: LRA 95p, RRA 40ost. PTA of LRA deferred 2/2 guide cath induced Ao dissection;  01/27 6 x 15 mm Herculink stent to the Left-RA  follows with [facility] Dr. Ronalee Belts  . Sinus headache    Past Surgical History:  Procedure Laterality Date  . APPENDECTOMY    . BILATERAL OOPHORECTOMY Bilateral 2000's  . CARDIAC CATHETERIZATION  04/24/2014  . CATARACT EXTRACTION W/ INTRAOCULAR LENS  IMPLANT, BILATERAL Bilateral   . JOINT REPLACEMENT    . LEFT HEART CATHETERIZATION WITH CORONARY ANGIOGRAM N/A 04/24/2014   Procedure: LEFT HEART CATHETERIZATION WITH CORONARY ANGIOGRAM;  Surgeon: Wellington Hampshire, MD;  Location: [facility];  Service: Cardiovascular;  Laterality: N/A;  . LUNG  LOBECTOMY Left 2003   "lower lobe"  . PERIPHERAL VASCULAR CATHETERIZATION  05/01/2014   Procedure: RENAL INTERVENTION;  Surgeon: Wellington Hampshire, MD; 6 x 15 mm Herculink stent to the Left Renal Artery  . RENAL ANGIOGRAM  04/24/2014  . RENAL ANGIOGRAM N/A 04/24/2014   Procedure: RENAL ANGIOGRAM;  Surgeon: Wellington Hampshire, MD;  Location: [facility];  Service: Cardiovascular;  Laterality: N/A;  . RENAL ANGIOGRAM Left 05/01/2014   Procedure: RENAL ANGIOGRAM;  Surgeon: Wellington Hampshire, MD;  Location: [facility];  Service: Cardiovascular;  Laterality: Left;  . REPLACEMENT TOTAL KNEE Left   . TOTAL HIP ARTHROPLASTY Right   . TUBAL LIGATION  ~ 1975  . VAGINAL DELIVERY  X 2  . VAGINAL HYSTERECTOMY  1980's   Family History   Problem Relation Age of Onset  . Parkinsonism Mother   . Alzheimer's disease Mother   . Sarcoidosis Brother   . Stroke Maternal Grandmother   . Cancer Paternal Grandmother    Social History   Socioeconomic History  . Marital status: Widowed    Spouse name: Not on file  . Number of children: Not on file  . Years of education: Not on file  . Highest education level: Not on file  Occupational History  . Not on file  Social Needs  . Financial resource strain: Not hard at all  . Food insecurity:    Worry: Never true    Inability: Never true  . Transportation needs:    Medical: No    Non-medical: No  Tobacco Use  . Smoking status: Never Smoker  . Smokeless tobacco: Never Used  . Tobacco comment: Father and husband both smokers  Substance and Sexual Activity  . Alcohol use: Yes    Comment: 04/24/2014 "glass of beer or wine q couple weeks"  . Drug use: No  . Sexual activity: Not on file  Lifestyle  . Physical activity:    Days per week: Not on file    Minutes per session: Not on file  . Stress: Not on file  Relationships  . Social connections:    Talks on phone: Not on file    Gets together: Not on file    Attends religious service: Not on file    Active member of club or organization: Not on file    Attends meetings of clubs or organizations: Not on file    Relationship status: Not on file  Other Topics Concern  . Not on file  Social History Narrative   Lives in [place]. Daughter nearby. Used to live in [place]   Normal ADLs as of 02/2017 and no h/o falls    Likes to walk   Lives alone     Outpatient Encounter Medications as of 08/23/2017  Medication Sig  . albuterol (PROVENTIL HFA;VENTOLIN HFA) 108 (90 Base) MCG/ACT inhaler Inhale 2 puffs into the lungs every 6 (six) hours as needed for wheezing or shortness of breath.  . Ascorbic Acid (VITAMIN C CR) 1500 MG TBCR Take by mouth daily.  Marland Kitchen aspirin 81 MG tablet Take 81 mg by mouth daily.  . B Complex  Vitamins (VITAMIN B COMPLEX PO) Take by mouth.  . carvedilol (COREG) 12.5 MG tablet Take 1 tablet (12.5 mg total) by mouth 2 (two) times daily with a meal. (Patient taking differently: Take 6.25 mg by mouth. )  . Cholecalciferol 2000 UNITS TABS Take 1 tablet by mouth daily.  Marland Kitchen ezetimibe (ZETIA) 10 MG tablet Take 10  mg by mouth daily.  . hydrALAZINE (APRESOLINE) 25 MG tablet Take 1 tablet (25 mg total) by mouth 3 (three) times daily. (Patient taking differently: Take 25 mg by mouth as needed. )  . losartan-hydrochlorothiazide (HYZAAR) 100-25 MG tablet Take 1 tablet by mouth daily.  . magnesium 30 MG tablet Take 30 mg 2 (two) times daily by mouth.  . Red Yeast Rice 600 MG CAPS Take 1,200 mg by mouth 2 (two) times daily.   . traMADol (ULTRAM) 50 MG tablet Take 1 tablet (50 mg total) by mouth daily as needed. For back pain/joint pain   No facility-administered encounter medications on file as of 08/23/2017.     Activities of Daily Living In your present state of health, do you have any difficulty performing the following activities: 08/23/2017  Hearing? N  Vision? N  Difficulty concentrating or making decisions? N  Walking or climbing stairs? N  Dressing or bathing? N  Doing errands, shopping? N  Preparing Food and eating ? N  Using the Toilet? N  In the past six months, have you accidently leaked urine? N  Do you have problems with loss of bowel control? N  Managing your Medications? N  Managing your Finances? N  Housekeeping or managing your Housekeeping? N  Some recent data might be hidden    Patient Care Team: McLean-Scocuzza, Nino Glow, MD as PCP - General (Internal Medicine) Wellington Hampshire, MD as Consulting Physician (Cardiology) Telford Nab, RN as Registered Nurse    Assessment:   This is a routine wellness examination for Jisell.  The goal of the wellness visit is to assist the patient how to close the gaps in care and create a preventative care plan for the patient.   The  roster of all physicians providing medical care to patient is listed in the Snapshot section of the chart.  Taking calcium VIT D as appropriate/Osteoporosis risk reviewed.    Safety issues reviewed; Smoke and carbon monoxide detectors in the home. No firearms in the home. Wears seatbelts when driving or riding with others. No violence in the home.  They do not have excessive sun exposure.  Discussed the need for sun protection: hats, long sleeves and the use of sunscreen if there is significant sun exposure.  Patient is alert, normal appearance, oriented to person/place/and time.  Correctly identified the president of the [place] and recalls of 2/3 words. Performs simple calculations and can read correct time from watch face. Displays appropriate judgement.  No new identified risk were noted.  No failures at ADL's or IADL's.    BMI- discussed the importance of a healthy diet, water intake and the benefits of aerobic exercise. Educational material provided.   24 hour diet recall: Low sodium diet   Dental- every 6 months.  Eye- Visual acuity not assessed per patient preference since they have regular follow up with the ophthalmologist.  Wears corrective lenses.  Sleep patterns- Sleeps through the night without issues. CPAP not in use.   Health maintenance gaps- closed.  Patient Concerns: None at this time. Follow up with PCP as needed.  Exercise Activities and Dietary recommendations Current Exercise Habits: Home exercise routine, Type of exercise: walking;stretching, Time (Minutes): 45, Frequency (Times/Week): 7, Weekly Exercise (Minutes/Week): 315, Intensity: Moderate  Goals    . Weight less than 120lb     Low carb foods  Portion controlled meals Walk for exercise, moderate pace        Fall Risk Fall Risk  07/11/2017 02/22/2017 02/20/2016  02/15/2014 02/05/2013  Falls in the past year? No No No No No   Depression Screen PHQ 2/9 Scores 07/11/2017 02/22/2017 02/20/2016  02/15/2014  PHQ - 2 Score 0 0 0 0     Cognitive Function MMSE - Mini Mental State Exam 08/23/2017  Orientation to time 5  Orientation to Place 5  Registration 3  Attention/ Calculation 5  Recall 2  Language- name 2 objects 2  Language- repeat 1  Language- follow 3 step command 3  Language- read & follow direction 1  Write a sentence 1  Copy design 1  Total score 29        Immunization History  Administered Date(s) Administered  . Influenza Split 01/18/2011, 01/05/2014  . Influenza Whole 01/04/2013  . Influenza, High Dose Seasonal PF 12/04/2015  . Influenza,inj,Quad PF,6+ Mos 02/19/2015  . Influenza-Unspecified 12/21/2016  . Pneumococcal Conjugate-13 02/15/2014  . Pneumococcal Polysaccharide-23 05/20/2008, 02/22/2017  . Td 10/05/2012  . Zoster 02/05/2013   Screening Tests Health Maintenance  Topic Date Due  . INFLUENZA VACCINE  11/03/2017  . TETANUS/TDAP  10/06/2022  . DEXA SCAN  Completed  . PNA vac Low Risk Adult  Completed      Plan:    End of life planning; Advance aging; Advanced directives discussed. Copy of current HCPOA/Living Will requested.    I have personally reviewed and noted the following in the patient's chart:   . Medical and social history . Use of alcohol, tobacco or illicit drugs  . Current medications and supplements . Functional ability and status . Nutritional status . Physical activity . Advanced directives . List of other physicians . Hospitalizations, surgeries, and ER visits in previous 12 months . Vitals . Screenings to include cognitive, depression, and falls . Referrals and appointments  In addition, I have reviewed and discussed with patient certain preventive protocols, quality metrics, and best practice recommendations. A written personalized care plan for preventive services as well as general preventive health recommendations were provided to patient.     Varney Biles, LPN  [id number]

## 2017-08-23 NOTE — Patient Instructions (Addendum)
  Ms. Tamara Burton , Thank you for taking time to come for your Medicare Wellness Visit. I appreciate your ongoing commitment to your health goals. Please review the following plan we discussed and let me know if I can assist you in the future.   Follow up as needed.    Bring a copy of your [place] and/or Living Will to be scanned into chart.  Have a great day!  These are the goals we discussed: Goals    . Weight between 115lb- 120lb     Low carb foods  Portion controlled meals Walk for exercise, moderate pace        This is a list of the screening recommended for you and due dates:  Health Maintenance  Topic Date Due  . Flu Shot  11/03/2017  . Tetanus Vaccine  10/06/2022  . DEXA scan (bone density measurement)  Completed  . Pneumonia vaccines  Completed

## 2017-08-25 ENCOUNTER — Other Ambulatory Visit: Payer: Self-pay | Admitting: Student

## 2017-08-26 ENCOUNTER — Ambulatory Visit
Admission: RE | Admit: 2017-08-26 | Discharge: 2017-08-26 | Disposition: A | Discharge status: Home / Self Care | Payer: Medicare HMO | Source: Ambulatory Visit | Attending: Diagnostic Radiology | Admitting: Diagnostic Radiology

## 2017-08-26 ENCOUNTER — Ambulatory Visit
Admission: RE | Admit: 2017-08-26 | Discharge: 2017-08-26 | Disposition: A | Discharge status: Home / Self Care | Payer: Medicare HMO | Source: Ambulatory Visit | Attending: Pulmonary Disease | Admitting: Pulmonary Disease

## 2017-08-26 DIAGNOSIS — Z85118 Personal history of other malignant neoplasm of bronchus and lung: Secondary | ICD-10-CM | POA: Insufficient documentation

## 2017-08-26 DIAGNOSIS — M545 Low back pain: Secondary | ICD-10-CM | POA: Insufficient documentation

## 2017-08-26 DIAGNOSIS — M1990 Unspecified osteoarthritis, unspecified site: Secondary | ICD-10-CM | POA: Insufficient documentation

## 2017-08-26 DIAGNOSIS — C3492 Malignant neoplasm of unspecified part of left bronchus or lung: Secondary | ICD-10-CM | POA: Diagnosis not present

## 2017-08-26 DIAGNOSIS — J929 Pleural plaque without asbestos: Secondary | ICD-10-CM | POA: Diagnosis not present

## 2017-08-26 DIAGNOSIS — Z79899 Other long term (current) drug therapy: Secondary | ICD-10-CM | POA: Insufficient documentation

## 2017-08-26 DIAGNOSIS — I2510 Atherosclerotic heart disease of native coronary artery without angina pectoris: Secondary | ICD-10-CM | POA: Diagnosis not present

## 2017-08-26 DIAGNOSIS — Z960 Presence of urogenital implants: Secondary | ICD-10-CM | POA: Diagnosis not present

## 2017-08-26 DIAGNOSIS — I10 Essential (primary) hypertension: Secondary | ICD-10-CM | POA: Diagnosis not present

## 2017-08-26 DIAGNOSIS — R918 Other nonspecific abnormal finding of lung field: Secondary | ICD-10-CM | POA: Diagnosis present

## 2017-08-26 DIAGNOSIS — G8929 Other chronic pain: Secondary | ICD-10-CM | POA: Diagnosis not present

## 2017-08-26 DIAGNOSIS — Z902 Acquired absence of lung [part of]: Secondary | ICD-10-CM | POA: Insufficient documentation

## 2017-08-26 DIAGNOSIS — Z96652 Presence of left artificial knee joint: Secondary | ICD-10-CM | POA: Insufficient documentation

## 2017-08-26 DIAGNOSIS — Z96641 Presence of right artificial hip joint: Secondary | ICD-10-CM | POA: Diagnosis not present

## 2017-08-26 DIAGNOSIS — J949 Pleural condition, unspecified: Secondary | ICD-10-CM | POA: Diagnosis not present

## 2017-08-26 DIAGNOSIS — E785 Hyperlipidemia, unspecified: Secondary | ICD-10-CM | POA: Insufficient documentation

## 2017-08-26 DIAGNOSIS — C384 Malignant neoplasm of pleura: Secondary | ICD-10-CM | POA: Diagnosis not present

## 2017-08-26 DIAGNOSIS — R222 Localized swelling, mass and lump, trunk: Secondary | ICD-10-CM

## 2017-08-26 DIAGNOSIS — J90 Pleural effusion, not elsewhere classified: Secondary | ICD-10-CM | POA: Diagnosis not present

## 2017-08-26 DIAGNOSIS — Z9889 Other specified postprocedural states: Secondary | ICD-10-CM

## 2017-08-26 LAB — CBC
HCT: 40.7 % (ref 35.0–47.0)
Hemoglobin: 14.1 g/dL (ref 12.0–16.0)
MCH: 30.7 pg (ref 26.0–34.0)
MCHC: 34.7 g/dL (ref 32.0–36.0)
MCV: 88.3 fL (ref 80.0–100.0)
Platelets: 244 10*3/uL (ref 150–440)
RBC: 4.61 MIL/uL (ref 3.80–5.20)
RDW: 12.8 % (ref 11.5–14.5)
WBC: 6.1 10*3/uL (ref 3.6–11.0)

## 2017-08-26 LAB — PROTIME-INR
INR: 0.89
Prothrombin Time: 12 seconds (ref 11.4–15.2)

## 2017-08-26 LAB — APTT: APTT: 31 s (ref 24–36)

## 2017-08-26 MED ORDER — LIDOCAINE HCL (PF) 1 % IJ SOLN
INTRAMUSCULAR | Status: AC | PRN
  Administered 2017-08-26: 10 mL
  Administered 2017-08-26: 7 mL

## 2017-08-26 MED ORDER — FENTANYL CITRATE (PF) 100 MCG/2ML IJ SOLN
INTRAMUSCULAR | Status: AC | PRN
  Administered 2017-08-26 (×2): 25 ug via INTRAVENOUS

## 2017-08-26 MED ORDER — HYDROCODONE-ACETAMINOPHEN 5-325 MG PO TABS: 1.0000 | ORAL_TABLET | ORAL | Status: DC | PRN

## 2017-08-26 MED ORDER — FENTANYL CITRATE (PF) 100 MCG/2ML IJ SOLN
INTRAMUSCULAR | Status: AC
  Filled 2017-08-26: qty 4

## 2017-08-26 MED ORDER — MIDAZOLAM HCL 5 MG/5ML IJ SOLN
INTRAMUSCULAR | Status: AC | PRN
  Administered 2017-08-26: 1 mg via INTRAVENOUS
  Administered 2017-08-26: 0.5 mg via INTRAVENOUS

## 2017-08-26 MED ORDER — MIDAZOLAM HCL 5 MG/5ML IJ SOLN
INTRAMUSCULAR | Status: AC
  Filled 2017-08-26: qty 5

## 2017-08-26 MED ORDER — SODIUM CHLORIDE 0.9 % IV SOLN: INTRAVENOUS | Status: DC

## 2017-08-26 NOTE — Procedures (Signed)
CT guided core biopsies of left pleural disease.  Total of 5 cores obtained.  Fragmented samples obtained.  Minimal blood loss and no immediate complication.

## 2017-08-26 NOTE — H&P (Signed)
Chief Complaint: Patient was seen in consultation today for a CT-guided biopsy at the request of Simonds,David B  Referring Physician(s): Simonds,David B  Patient Status: [facility] - Out-pt  History of Present Illness: Tamara Burton is a 84 y.o. female with a remote history of lung cancer status post left lower lobectomy in 2003.  Patient is a non-smoker.  Patient presented a few months ago with left chest wall symptoms.  Patient has subsequently had imaging that has demonstrated multifocal left pleural disease and concern for a soft tissue lesion in the lingula.  Patient has been referred for a CT-guided chest biopsy.  Patient continues to have complaints of pain in the left chest.  Otherwise, the patient feels fine and she is very active.  Past Medical History:  Diagnosis Date  . Arthritis    "qwhere"  . Carotid artery disease ([place])   . Chronic lower back pain   . Colon polyps    benign per pt   . Epistaxis    with use of nasal sprays   . Headache    "lately; related to HTN" (04/24/2014)  . Hyperlipidemia   . Hypertension   . Hyponatremia    h/o  . Kidney stone   . Lung cancer ([place]) 2003   s/p left lower lobe resection  . Non-obstructive CAD    a. 04/2014 Cath: LM nl, LAD/D1/LCX/RCA min irregs, RPDA/PAV nl.  . Orthostatic hypotension   . OSA on CPAP    "wear it intermittently"  . PONV (postoperative nausea and vomiting)    "when I was younger"  . Renal artery stenosis ([place])    a. 04/2014 Renal angiography: LRA 95p, RRA 40ost. PTA of LRA deferred 2/2 guide cath induced Ao dissection;  01/27 6 x 15 mm Herculink stent to the Left-RA  follows with [facility] Dr. Ronalee Belts  . Sinus headache     Past Surgical History:  Procedure Laterality Date  . APPENDECTOMY    . BILATERAL OOPHORECTOMY Bilateral 2000's  . CARDIAC CATHETERIZATION  04/24/2014  . CATARACT EXTRACTION W/ INTRAOCULAR LENS  IMPLANT, BILATERAL Bilateral   . JOINT REPLACEMENT    . LEFT HEART CATHETERIZATION WITH CORONARY  ANGIOGRAM N/A 04/24/2014   Procedure: LEFT HEART CATHETERIZATION WITH CORONARY ANGIOGRAM;  Surgeon: Wellington Hampshire, MD;  Location: [facility] CATH LAB;  Service: Cardiovascular;  Laterality: N/A;  . LUNG LOBECTOMY Left 2003   "lower lobe"  . PERIPHERAL VASCULAR CATHETERIZATION  05/01/2014   Procedure: RENAL INTERVENTION;  Surgeon: Wellington Hampshire, MD; 6 x 15 mm Herculink stent to the Left Renal Artery  . RENAL ANGIOGRAM  04/24/2014  . RENAL ANGIOGRAM N/A 04/24/2014   Procedure: RENAL ANGIOGRAM;  Surgeon: Wellington Hampshire, MD;  Location: [facility] CATH LAB;  Service: Cardiovascular;  Laterality: N/A;  . RENAL ANGIOGRAM Left 05/01/2014   Procedure: RENAL ANGIOGRAM;  Surgeon: Wellington Hampshire, MD;  Location: [facility];  Service: Cardiovascular;  Laterality: Left;  . REPLACEMENT TOTAL KNEE Left   . TOTAL HIP ARTHROPLASTY Right   . TUBAL LIGATION  ~ 1975  . VAGINAL DELIVERY  X 2  . VAGINAL HYSTERECTOMY  1980's    Allergies: Atorvastatin; Penicillins; Sulfa antibiotics; and Amlodipine  Medications: Prior to Admission medications   Medication Sig Start Date End Date Taking? Authorizing Provider  acetaminophen (TYLENOL) 325 MG tablet Take 500 mg by mouth every 6 (six) hours as needed.   Yes [provider]  Ascorbic Acid (VITAMIN C CR) 1500 MG TBCR Take by mouth daily.  Yes [provider]  aspirin 81 MG tablet Take 81 mg by mouth daily.   Yes [provider]  B Complex Vitamins (VITAMIN B COMPLEX PO) Take 1 tablet by mouth daily.    Yes [provider]  carvedilol (COREG) 12.5 MG tablet Take 1 tablet (12.5 mg total) by mouth 2 (two) times daily with a meal. Patient taking differently: Take 3.125 mg by mouth 2 (two) times daily with a meal.  06/03/16  Yes Wellington Hampshire, MD  Cholecalciferol 2000 UNITS TABS Take 1 tablet by mouth daily.   Yes [provider]  ezetimibe (ZETIA) 10 MG tablet Take 10 mg by mouth daily.   Yes [provider]    losartan-hydrochlorothiazide (HYZAAR) 100-25 MG tablet Take 1 tablet by mouth daily. 06/06/17  Yes Wellington Hampshire, MD  magnesium 30 MG tablet Take 30 mg by mouth daily.    Yes [provider]  Red Yeast Rice 600 MG CAPS Take 1,200 mg by mouth 2 (two) times daily.    Yes [provider]  albuterol (PROVENTIL HFA;VENTOLIN HFA) 108 (90 Base) MCG/ACT inhaler Inhale 2 puffs into the lungs every 6 (six) hours as needed for wheezing or shortness of breath. 07/10/15   Coral Spikes, DO  hydrALAZINE (APRESOLINE) 25 MG tablet Take 1 tablet (25 mg total) by mouth 3 (three) times daily. Patient taking differently: Take 25 mg by mouth as needed.  07/23/14   Dunn, Areta Haber, PA-C  traMADol (ULTRAM) 50 MG tablet Take 1 tablet (50 mg total) by mouth daily as needed. For back pain/joint pain 04/20/17   McLean-Scocuzza, Nino Glow, MD     Family History  Problem Relation Age of Onset  . Parkinsonism Mother   . Alzheimer's disease Mother   . Sarcoidosis Brother   . Stroke Maternal Grandmother   . Cancer Paternal Grandmother     Social History   Socioeconomic History  . Marital status: Widowed    Spouse name: Not on file  . Number of children: Not on file  . Years of education: Not on file  . Highest education level: Not on file  Occupational History  . Not on file  Social Needs  . Financial resource strain: Not hard at all  . Food insecurity:    Worry: Never true    Inability: Never true  . Transportation needs:    Medical: No    Non-medical: No  Tobacco Use  . Smoking status: Never Smoker  . Smokeless tobacco: Never Used  . Tobacco comment: Father and husband both smokers  Substance and Sexual Activity  . Alcohol use: Yes    Comment: 04/24/2014 "glass of beer or wine q couple weeks"  . Drug use: No  . Sexual activity: Not on file  Lifestyle  . Physical activity:    Days per week: Not on file    Minutes per session: Not on file  . Stress: Not on file  Relationships  . Social  connections:    Talks on phone: Not on file    Gets together: Not on file    Attends religious service: Not on file    Active member of club or organization: Not on file    Attends meetings of clubs or organizations: Not on file    Relationship status: Not on file  Other Topics Concern  . Not on file  Social History Narrative   Lives in [place]. Daughter nearby. Used to live in [place]  Normal ADLs as of 02/2017 and no h/o falls    Likes to walk   Lives alone      Review of Systems: A 12 point ROS discussed and pertinent positives are indicated in the HPI above.  All other systems are negative.  Review of Systems  Constitutional: Negative.   Respiratory: Negative.   Cardiovascular: Negative.   Gastrointestinal: Negative.   Genitourinary: Negative.   Neurological: Negative.     Vital Signs: BP (!) 155/70   Pulse 78   Resp 16   Ht 4\' 11"  (1.499 m)   Wt 124 lb (56.2 kg)   LMP  (LMP Unknown)   SpO2 100%   BMI 25.04 kg/m   Physical Exam  Constitutional: No distress.  HENT:  Mouth/Throat: Oropharynx is clear and moist.  Cardiovascular: Normal rate, regular rhythm and normal heart sounds.  Pulmonary/Chest: Effort normal and breath sounds normal.  Abdominal: Soft. Bowel sounds are normal.    Imaging: Nm Pet Image Initial (pi) Skull Base To Thigh  Result Date: 08/17/2017 CLINICAL DATA:  Initial treatment strategy for lung nodules. History of non-small-cell lung cancer with left-sided lobectomy in 2003. EXAM: NUCLEAR MEDICINE PET SKULL BASE TO THIGH TECHNIQUE: 6.3 mCi F-18 FDG was injected intravenously. Full-ring PET imaging was performed from the skull base to thigh after the radiotracer. CT data was obtained and used for attenuation correction and anatomic localization. Fasting blood glucose: 82 mg/dl COMPARISON:  Chest CT 07/15/2017.  Abdominopelvic CT 10/25/2015. FINDINGS: Mediastinal blood pool activity: SUV max 2.6 NECK: No areas of abnormal hypermetabolism.  Incidental CT findings: No cervical adenopathy. Bilateral carotid atherosclerosis. CHEST: Multifocal left-sided pleural hypermetabolism. Examples an area of soft tissue thickening which measures 8 mm and a S.U.V. max of 5.3 within the anterior left pleural space on image 78/3. Within the inferolateral left pleural space with soft tissue thickening of 9 mm and a S.U.V. max of 6.5 on image 107/3. Lingular soft tissue density without air bronchograms, corresponding to hypermetabolism. This measures 2.9 x 3.1 cm and a S.U.V. max of 6.9 on image 86/3. No thoracic nodal hypermetabolism. Incidental CT findings: Small, partially loculated left-sided pleural fluid, slightly increased. Mild cardiomegaly. Aortic and coronary artery atherosclerosis. Moderate emphysema. Left lower lobectomy. A left upper lobe 8 mm pulmonary nodule on image 98/3 is below PET resolution. ABDOMEN/PELVIS: Left adrenal hypermetabolism is without CT correlate. This measures a S.U.V. max of 2.8 on image 128/3. No other abdominopelvic parenchymal hypermetabolism identified. Incidental CT findings: Upper pole right renal collecting system 11 mm stone. Interpolar 2.6 cm right renal cyst. Abdominal aortic atherosclerosis with left renal artery stent. Pelvic floor laxity. Hysterectomy. SKELETON: No abnormal marrow activity. Incidental CT findings: Right hip arthroplasty.  Osteopenia. IMPRESSION: 1. Multifocal left-sided pleural hypermetabolism and soft tissue thickening, consistent with pleural metastasis. 2. Hypermetabolic soft tissue density within the lingula, suspicious for primary bronchogenic carcinoma. Pleural and pulmonary metastasis from patient's remote primary felt less likely. 3. Left adrenal hypermetabolism is nonspecific and without CT correlate. Otherwise, no evidence of extrathoracic metastatic disease. 4. Aortic atherosclerosis (ICD10-I70.0), coronary artery atherosclerosis and emphysema (ICD10-J43.9). 5. Right nephrolithiasis.  Electronically Signed   By: Abigail Miyamoto M.D.   On: 08/17/2017 08:55    Labs:  CBC: Recent Labs    03/11/17 1010 07/15/17 1031 08/26/17 1031  WBC 6.4 5.7 6.1  HGB 13.5 13.5 14.1  HCT 41.1 39.8 40.7  PLT 255.0 240 244    COAGS: Recent Labs    08/26/17 1031  INR 0.89  APTT 31    BMP: Recent Labs    03/11/17 1010 07/15/17 0925 07/15/17 1031  NA 139  --  137  K 4.2  --  3.5  CL 100  --  100*  CO2 31  --  29  GLUCOSE 99  --  106*  BUN 21  --  18  CALCIUM 9.2  --  9.1  CREATININE 0.82 0.80 0.66  GFRNONAA  --   --  >60  GFRAA  --   --  >60    LIVER FUNCTION TESTS: Recent Labs    03/11/17 1010 07/15/17 1031  BILITOT 0.7 0.9  AST 23 25  ALT 14 17  ALKPHOS 100 100  PROT 6.7 6.9  ALBUMIN 4.2 4.1    TUMOR MARKERS: No results for input(s): AFPTM, CEA, CA199, CHROMGRNA in the last 8760 hours.  Assessment and Plan:  84 year old female with suspicious disease in the left chest particularly involving the left pleura.  Patient has a remote history of lung cancer.  Patient needs a tissue diagnosis.  Discussed CT-guided biopsy of the left pleural-based disease and possibly of the lingular lesion.  Explained the risks of the procedure which include bleeding and pneumothorax.  Patient has a good understanding of the procedure and informed consent was obtained.  Plan for CT-guided biopsy of a pleural-based lesion with moderate sedation.  Patient understands that if I do not feel like the pleural biopsy will be adequate then we will turn our attention to the lingular lesion.  Thank you for this interesting consult.  I greatly enjoyed meeting Eliot Bencivenga and look forward to participating in their care.  A copy of this report was sent to the requesting provider on this date.  Electronically Signed: Burman Riis, MD 08/26/2017, 11:45 AM   I spent a total of  15 Minutes   in face to face in clinical consultation, greater than 50% of which was counseling/coordinating care  for CT-guided biopsy.

## 2017-08-26 NOTE — Discharge Instructions (Signed)
Lung Biopsy, Care After This sheet gives you information about how to care for yourself after your procedure. Your health care provider may also give you more specific instructions depending on the type of biopsy you had. If you have problems or questions, contact your health care provider. What can I expect after the procedure? After the procedure, it is common to have:  A cough.  A sore throat.  Pain where a needle, bronchoscope, or incision was used to collect a biopsy sample (biopsy site).  You may cough up a small amount of bloody sputum for up to several days after your biopsy.  If you cough up more than a TBSP of blood come to the emergency room by EMS.   Follow these instructions at home: Medicines  Take over-the-counter and prescription medicines only as told by your health care provider.  Do not drive for 24 hours if you were given a sedative.  Do not drink alcohol while taking pain medicine.  Do not drive or use heavy machinery while taking prescription pain medicine.  To prevent or treat constipation while you are taking prescription pain medicine, your health care provider may recommend that you: ? Drink enough fluid to keep your urine clear or pale yellow. ? Take over-the-counter or prescription medicines. ? Eat foods that are high in fiber, such as fresh fruits and vegetables, whole grains, and beans. ? Limit foods that are high in fat and processed sugars, such as fried and sweet foods. Activity  If you had an incision during your procedure, avoid activities that may pull the incision site open.  Return to your normal activities tomorrow.   You may shower tomorrow leave band aid on site and pat area dry. Change your band aid after your shower.  You may remove band aid the following day.   Do not scrub or rub your biopsy site or area surrounding it.  If you had an open biopsy:   Follow instructions from your health care provider about how to take care of your  incision. Make sure you: ? Wash your hands with soap and water before you change your bandage (dressing). If soap and water are not available, use hand sanitizer. ? Remove your dressing in 2 days ? Leave stitches (sutures), skin glue, or adhesive strips in place. These skin closures may need to stay in place for 2 weeks or longer. If adhesive strip edges start to loosen and curl up, you may trim the loose edges. Do not remove adhesive strips completely unless your health care provider tells you to do that.  Check your incision area every day for signs of infection. Check for: ? Redness, swelling, or pain. ? Fluid or blood. ? Warmth. ? Pus or a bad smell. General instructions  It is up to you to get the results of your procedure. Ask your health care provider, or the department that is doing the procedure, when your results will be ready. Contact a health care provider if:  You have a fever.  You have redness, swelling, or pain around your biopsy site.  You have fluid or blood coming from your biopsy site.  Your biopsy site feels warm to the touch.  You have pus or a bad smell coming from your biopsy site. Get help right away if:  You cough up blood.  You have trouble breathing.  You have chest pain. Summary  After the procedure, it is common to have a sore throat and a cough.  Return to  your normal activities as told by your health care provider. Ask your health care provider what activities are safe for you.  Take over-the-counter and prescription medicines only as told by your health care provider.  Report any unusual symptoms to your health care provider. This information is not intended to replace advice given to you by your health care provider. Make sure you discuss any questions you have with your health care provider. Document Released: 04/20/2016 Document Revised: 04/20/2016 Document Reviewed: 04/20/2016 Elsevier Interactive Patient Education  2018 Elsevier  Inc.Lung Biopsy, Care After This sheet gives you information about how to care for yourself after your procedure. Your health care provider may also give you more specific instructions depending on the type of biopsy you had. If you have problems or questions, contact your health care provider. What can I expect after the procedure? After the procedure, it is common to have:  A cough.  A sore throat.  Pain where a needle, bronchoscope, or incision was used to collect a biopsy sample (biopsy site).  You may cough up a small amount of bloody sputum for up to several days after your biopsy.  If you cough up more than a TBSP of blood come to the emergency room by EMS.   Follow these instructions at home: Medicines  Take over-the-counter and prescription medicines only as told by your health care provider.  Do not drive for 24 hours if you were given a sedative.  Do not drink alcohol while taking pain medicine.  Do not drive or use heavy machinery while taking prescription pain medicine.  To prevent or treat constipation while you are taking prescription pain medicine, your health care provider may recommend that you: ? Drink enough fluid to keep your urine clear or pale yellow. ? Take over-the-counter or prescription medicines. ? Eat foods that are high in fiber, such as fresh fruits and vegetables, whole grains, and beans. ? Limit foods that are high in fat and processed sugars, such as fried and sweet foods. Activity  If you had an incision during your procedure, avoid activities that may pull the incision site open.  Return to your normal activities tomorrow.   You may shower tomorrow leave band aid on site and pat area dry. Change your band aid after your shower.  You may remove band aid the following day.   Do not scrub or rub your biopsy site or area surrounding it.  If you had an open biopsy:   Follow instructions from your health care provider about how to take care of your  incision. Make sure you: ? Wash your hands with soap and water before you change your bandage (dressing). If soap and water are not available, use hand sanitizer. ? Remove your dressing in 2 days ? Leave stitches (sutures), skin glue, or adhesive strips in place. These skin closures may need to stay in place for 2 weeks or longer. If adhesive strip edges start to loosen and curl up, you may trim the loose edges. Do not remove adhesive strips completely unless your health care provider tells you to do that.  Check your incision area every day for signs of infection. Check for: ? Redness, swelling, or pain. ? Fluid or blood. ? Warmth. ? Pus or a bad smell. General instructions  It is up to you to get the results of your procedure. Ask your health care provider, or the department that is doing the procedure, when your results will be ready. Contact a health  care provider if:  You have a fever.  You have redness, swelling, or pain around your biopsy site.  You have fluid or blood coming from your biopsy site.  Your biopsy site feels warm to the touch.  You have pus or a bad smell coming from your biopsy site. Get help right away if:  You cough up blood.  You have trouble breathing.  You have chest pain. Summary  After the procedure, it is common to have a sore throat and a cough.  Return to your normal activities as told by your health care provider. Ask your health care provider what activities are safe for you.  Take over-the-counter and prescription medicines only as told by your health care provider.  Report any unusual symptoms to your health care provider. This information is not intended to replace advice given to you by your health care provider. Make sure you discuss any questions you have with your health care provider. Document Released: 04/20/2016 Document Revised: 04/20/2016 Document Reviewed: 04/20/2016 Elsevier Interactive Patient Education  [employer].

## 2017-08-30 ENCOUNTER — Other Ambulatory Visit: Payer: Self-pay | Admitting: Pathology

## 2017-08-30 LAB — SURGICAL PATHOLOGY

## 2017-09-02 ENCOUNTER — Inpatient Hospital Stay: Payer: Medicare HMO | Attending: Oncology | Admitting: Oncology

## 2017-09-02 ENCOUNTER — Other Ambulatory Visit: Payer: Self-pay | Admitting: *Deleted

## 2017-09-02 ENCOUNTER — Telehealth: Payer: Self-pay | Admitting: Cardiovascular Disease

## 2017-09-02 ENCOUNTER — Encounter: Payer: Self-pay | Admitting: Oncology

## 2017-09-02 ENCOUNTER — Inpatient Hospital Stay: Payer: Medicare HMO

## 2017-09-02 ENCOUNTER — Encounter: Payer: Self-pay | Admitting: *Deleted

## 2017-09-02 VITALS — BP 130/78 | HR 68 | Temp 98.1°F | Resp 18 | Ht 59.0 in | Wt 124.3 lb

## 2017-09-02 DIAGNOSIS — Z87442 Personal history of urinary calculi: Secondary | ICD-10-CM | POA: Insufficient documentation

## 2017-09-02 DIAGNOSIS — R079 Chest pain, unspecified: Secondary | ICD-10-CM | POA: Diagnosis not present

## 2017-09-02 DIAGNOSIS — G4730 Sleep apnea, unspecified: Secondary | ICD-10-CM | POA: Diagnosis not present

## 2017-09-02 DIAGNOSIS — M129 Arthropathy, unspecified: Secondary | ICD-10-CM | POA: Diagnosis not present

## 2017-09-02 DIAGNOSIS — Z79899 Other long term (current) drug therapy: Secondary | ICD-10-CM | POA: Diagnosis not present

## 2017-09-02 DIAGNOSIS — I959 Hypotension, unspecified: Secondary | ICD-10-CM | POA: Insufficient documentation

## 2017-09-02 DIAGNOSIS — Z86010 Personal history of colonic polyps: Secondary | ICD-10-CM | POA: Insufficient documentation

## 2017-09-02 DIAGNOSIS — C782 Secondary malignant neoplasm of pleura: Secondary | ICD-10-CM | POA: Diagnosis not present

## 2017-09-02 DIAGNOSIS — I10 Essential (primary) hypertension: Secondary | ICD-10-CM

## 2017-09-02 DIAGNOSIS — R000 Tachycardia, unspecified: Secondary | ICD-10-CM | POA: Insufficient documentation

## 2017-09-02 DIAGNOSIS — I701 Atherosclerosis of renal artery: Secondary | ICD-10-CM | POA: Insufficient documentation

## 2017-09-02 DIAGNOSIS — C3490 Malignant neoplasm of unspecified part of unspecified bronchus or lung: Secondary | ICD-10-CM | POA: Insufficient documentation

## 2017-09-02 DIAGNOSIS — Z7982 Long term (current) use of aspirin: Secondary | ICD-10-CM | POA: Diagnosis not present

## 2017-09-02 DIAGNOSIS — E785 Hyperlipidemia, unspecified: Secondary | ICD-10-CM | POA: Insufficient documentation

## 2017-09-02 DIAGNOSIS — M549 Dorsalgia, unspecified: Secondary | ICD-10-CM | POA: Insufficient documentation

## 2017-09-02 DIAGNOSIS — C3432 Malignant neoplasm of lower lobe, left bronchus or lung: Secondary | ICD-10-CM | POA: Diagnosis not present

## 2017-09-02 DIAGNOSIS — Z7189 Other specified counseling: Secondary | ICD-10-CM | POA: Insufficient documentation

## 2017-09-02 MED ORDER — LOSARTAN POTASSIUM-HCTZ 100-25 MG PO TABS
1.0000 | ORAL_TABLET | Freq: Every day | ORAL | 0 refills | Status: DC
Start: 2017-09-02 — End: 2018-01-19

## 2017-09-02 NOTE — Telephone Encounter (Signed)
Requested Prescriptions   Signed Prescriptions Disp Refills  . losartan-hydrochlorothiazide (HYZAAR) 100-25 MG tablet 90 tablet 0    Sig: Take 1 tablet by mouth daily.    Authorizing Provider: Kathlyn [place] A    Ordering User: Britt Bottom

## 2017-09-02 NOTE — Progress Notes (Signed)
  Oncology Nurse Navigator Documentation  Navigator Location: [facility] (09/02/17 1300)   )Navigator Encounter Type: Clinic/MDC (09/02/17 1300)     Confirmed Diagnosis Date: 08/30/17 (09/02/17 1300)         Multidisiplinary Clinic Date: 09/02/17 (09/02/17 1300) Multidisiplinary Clinic Type: Thoracic (09/02/17 1300)   Patient Visit Type: MedOnc (09/02/17 1300) Treatment Phase: Pre-Tx/Tx Discussion (09/02/17 1300) Barriers/Navigation Needs: Education;Coordination of Care (09/02/17 1300) Education: Understanding Cancer/ Treatment Options;Newly Diagnosed Cancer Education (09/02/17 1300) Interventions: Coordination of Care;Education (09/02/17 1300)   Coordination of Care: Appts;Radiology (09/02/17 1300) Education Method: Verbal;Written (09/02/17 1300)       met with patient during initial consultation in the thoracic multidisciplinary clinic. Dr. Tasia Catchings reviewed imaging and pathology results with patient and discussed next steps. Reviewed with patient need for liquid biopsy testing and how we will use the results. Pt given resources regarding diagnosis and supportive services available. Pt informed that will scheduled follow up appt once results from liquid biopsy are available which approximately takes 2 weeks to report back. Reviewed with pt that will obtain brain mri in the interim. Pt given contact info and instructed to call with any further questions or needs. Pt informed that will be notified once follow up appt has been scheduled. Pt verbalized understanding. Nothing further needed at this time.          Time Spent with Patient: 60 (09/02/17 1300)

## 2017-09-02 NOTE — Telephone Encounter (Signed)
°*  STAT* If patient is at the pharmacy, call can be transferred to refill team.   1. Which medications need to be refilled? (please list name of each medication and dose if known)  Losartan   2. Which pharmacy/location (including street and city if local pharmacy) is medication to be sent to? [facility] mail order   3. Do they need a 30 day or 90 day supply? 90 day

## 2017-09-02 NOTE — Progress Notes (Signed)
Pt here for lung nodules on scan. She has hx of lung cancer in 2003

## 2017-09-02 NOTE — Progress Notes (Addendum)
Hematology/Oncology Consult note [facility] [phone] [phone]   Patient Care Team: McLean-Scocuzza, Nino Glow, MD as PCP - General (Internal Medicine) Wellington Hampshire, MD as Consulting Physician (Cardiology) Telford Nab, RN as Registered Nurse  REFERRING PROVIDER: Dr.Simonds CHIEF COMPLAINTS/PURPOSE OF CONSULTATION:  Evaluation of lung cancer  HISTORY OF PRESENTING ILLNESS:  Tamara Burton is a  84 y.o.  female with PMH listed below who was referred to me for evaluation of lung cancer.  Patient reports a remote history of lung cancer in 2003 and status post left lower lobectomy.  We obtained medical  records from [facility] in [place].  Reviewed pathology results.  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  Perihilar lymph node negative.  Peri-bronchial lymph node negative. Patient is accompanied by her friend who is her house health power of attorney Patient has noticed mild chronic left side chest wall pain which she described as a stitch, for about 6 months to a year.  Pain is very mild and she takes Tylenol or tramadol as needed.  Pain got little worse early this year. 07/15/2017 CT chest with contrast showed new left pleural process since prior CT from 2016.  Findings most suspicious for pleural tumor with complex pleural fluid.  Medial left upper lobe atelectasis without obvious tumor. 08/16/2017 PET scan showed multifocal left side pleural hypermetabolic is him and a soft tissue thickening, consistent with pleural metastasis Hyper metabolic soft tissue density within the lingula, suspicious for primary bronchogenic carcinoma.   There is also left adrenal hypermetabolic them is nonspecific and without CT correlate.  Otherwise no evidence of extra thoracic metastatic disease.  Right nephrolithiasis.  #  08/26/2017 CT biopsy of left lower chest pleura showed positive for adenocarcinoma.   Patient's case was discussed on tumor board on Sep 01, 2017.  Per pathology, there is no enough tissue for foundation one testing or Omniseq.  Patient was referred by pulmonology Dr. Shawna Orleans to me to discuss about management plan.  overall she is very active 84 year old female.  She reports minimal pain.  She has chronic degenerative disease of spine and chronic back pain.  She also has a history of renal artery stenosis status post renal angiography in 2016.  She mentions she has labile blood pressure. Lives by herself.  Denies any weight loss, cough, hemoptysis, shortness of breath.  Review of Systems  Constitutional: Negative for chills, fever, malaise/fatigue and weight loss.  HENT: Negative for congestion, ear discharge, ear pain, nosebleeds, sinus pain and sore throat.   Eyes: Negative for double vision, photophobia, pain, discharge and redness.  Respiratory: Negative for cough, hemoptysis, sputum production, shortness of breath and wheezing.   Cardiovascular: Negative for chest pain, palpitations, orthopnea, claudication and leg swelling.  Gastrointestinal: Negative for abdominal pain, blood in stool, constipation, diarrhea, heartburn, melena, nausea and vomiting.  Genitourinary: Negative for dysuria, flank pain, frequency and hematuria.  Musculoskeletal: Negative for back pain, myalgias and neck pain.       Intermittent mild left lower anterior chest wall pain.  Skin: Negative for itching and rash.  Neurological: Negative for dizziness, tingling, tremors, focal weakness, weakness and headaches.  Endo/Heme/Allergies: Negative for environmental allergies. Does not bruise/bleed easily.  Psychiatric/Behavioral: Negative for depression and hallucinations. The patient is not nervous/anxious.     MEDICAL HISTORY:  Past Medical History:  Diagnosis Date  . Arthritis    "qwhere"  . Carotid artery  disease  ([place])   . Chronic lower back pain   . Colon polyps    benign per pt   . Epistaxis    with use of nasal sprays   . Headache    "lately; related to HTN" (04/24/2014)  . Hyperlipidemia   . Hypertension   . Hyponatremia    h/o  . Kidney stone   . Lung cancer ([place]) 2003   s/p left lower lobe resection  . Non-obstructive CAD    a. 04/2014 Cath: LM nl, LAD/D1/LCX/RCA min irregs, RPDA/PAV nl.  . Orthostatic hypotension   . OSA on CPAP    "wear it intermittently"  . PONV (postoperative nausea and vomiting)    "when I was younger"  . Renal artery stenosis ([place])    a. 04/2014 Renal angiography: LRA 95p, RRA 40ost. PTA of LRA deferred 2/2 guide cath induced Ao dissection;  01/27 6 x 15 mm Herculink stent to the Left-RA  follows with [facility] Dr. Ronalee Belts  . Sinus headache     SURGICAL HISTORY: Past Surgical History:  Procedure Laterality Date  . APPENDECTOMY    . BILATERAL OOPHORECTOMY Bilateral 2000's  . CARDIAC CATHETERIZATION  04/24/2014  . CATARACT EXTRACTION W/ INTRAOCULAR LENS  IMPLANT, BILATERAL Bilateral   . JOINT REPLACEMENT    . LEFT HEART CATHETERIZATION WITH CORONARY ANGIOGRAM N/A 04/24/2014   Procedure: LEFT HEART CATHETERIZATION WITH CORONARY ANGIOGRAM;  Surgeon: Wellington Hampshire, MD;  Location: [facility] CATH [facility];  Service: Cardiovascular;  Laterality: N/A;  . LUNG LOBECTOMY Left 2003   "lower lobe"  . PERIPHERAL VASCULAR CATHETERIZATION  05/01/2014   Procedure: RENAL INTERVENTION;  Surgeon: Wellington Hampshire, MD; 6 x 15 mm Herculink stent to the Left Renal Artery  . RENAL ANGIOGRAM  04/24/2014  . RENAL ANGIOGRAM N/A 04/24/2014   Procedure: RENAL ANGIOGRAM;  Surgeon: Wellington Hampshire, MD;  Location: [facility] CATH [facility];  Service: Cardiovascular;  Laterality: N/A;  . RENAL ANGIOGRAM Left 05/01/2014   Procedure: RENAL ANGIOGRAM;  Surgeon: Wellington Hampshire, MD;  Location: [facility] CATH [facility];  Service: Cardiovascular;  Laterality: Left;  . REPLACEMENT TOTAL KNEE Left   . TOTAL HIP ARTHROPLASTY Right   .  TUBAL LIGATION  ~ 1975  . VAGINAL DELIVERY  X 2  . VAGINAL HYSTERECTOMY  1980's    SOCIAL HISTORY: Social History   Socioeconomic History  . Marital status: Widowed    Spouse name: Not on file  . Number of children: Not on file  . Years of education: Not on file  . Highest education level: Not on file  Occupational History  . Not on file  Social Needs  . Financial resource strain: Not hard at all  . Food insecurity:    Worry: Never true    Inability: Never true  . Transportation needs:    Medical: No    Non-medical: No  Tobacco Use  . Smoking status: Never Smoker  . Smokeless tobacco: Never Used  . Tobacco comment: Father and husband both smokers  Substance and Sexual Activity  . Alcohol use: Yes    Comment: 04/24/2014 "glass of beer or wine q couple weeks"  . Drug use: No  . Sexual activity: Not on file  Lifestyle  . Physical activity:    Days per week: Not on file    Minutes per session: Not on file  . Stress: Not on file  Relationships  . Social connections:    Talks on phone: Not on file    Gets together:  Not on file    Attends religious service: Not on file    Active member of club or organization: Not on file    Attends meetings of clubs or organizations: Not on file    Relationship status: Not on file  . Intimate partner violence:    Fear of current or ex partner: Not on file    Emotionally abused: Not on file    Physically abused: Not on file    Forced sexual activity: Not on file  Other Topics Concern  . Not on file  Social History Narrative   Lives in [place]. Daughter nearby. Used to live in [place]   Normal ADLs as of 02/2017 and no h/o falls    Likes to walk   Lives alone     FAMILY HISTORY: Family History  Problem Relation Age of Onset  . Parkinsonism Mother   . Alzheimer's disease Mother   . Sarcoidosis Brother   . Stroke Maternal Grandmother   . Stomach cancer Paternal Grandmother   . Diabetes Son   . Diabetes Daughter      ALLERGIES:  is allergic to atorvastatin; penicillins; sulfa antibiotics; and amlodipine.  MEDICATIONS:  Current Outpatient Medications  Medication Sig Dispense Refill  . acetaminophen (TYLENOL) 500 MG tablet Take 500 mg by mouth every 6 (six) hours as needed.    Marland Kitchen albuterol (PROVENTIL HFA;VENTOLIN HFA) 108 (90 Base) MCG/ACT inhaler Inhale 2 puffs into the lungs every 6 (six) hours as needed for wheezing or shortness of breath. 1 Inhaler 0  . Ascorbic Acid (VITAMIN C CR) 1500 MG TBCR Take by mouth daily.    Marland Kitchen aspirin 81 MG tablet Take 81 mg by mouth daily.    . Biotin 2500 MCG CAPS Take 1 capsule by mouth daily.    . carvedilol (COREG) 12.5 MG tablet Take 1 tablet (12.5 mg total) by mouth 2 (two) times daily with a meal. (Patient taking differently: Take 3.125 mg by mouth 2 (two) times daily with a meal. ) 180 tablet 3  . Cholecalciferol (VITAMIN D3) 5000 units CAPS Take 1 capsule by mouth daily.    . Coenzyme Q10 (CO Q-10) 200 MG CAPS Take 1 capsule by mouth 2 (two) times daily.    Marland Kitchen ezetimibe (ZETIA) 10 MG tablet Take 10 mg by mouth daily.    Marland Kitchen glucosamine-chondroitin 500-400 MG tablet Take 1 tablet by mouth 2 (two) times daily.    . hydrALAZINE (APRESOLINE) 25 MG tablet Take 1 tablet (25 mg total) by mouth 3 (three) times daily. (Patient taking differently: Take 25 mg by mouth as needed. ) 90 tablet 6  . losartan-hydrochlorothiazide (HYZAAR) 100-25 MG tablet Take 1 tablet by mouth daily. 90 tablet 0  . Magnesium 500 MG CAPS Take 1 capsule by mouth daily.    . Red Yeast Rice 600 MG CAPS Take 1,200 mg by mouth 2 (two) times daily.     . traMADol (ULTRAM) 50 MG tablet Take 1 tablet (50 mg total) by mouth daily as needed. For back pain/joint pain 90 tablet 0  . acetaminophen (TYLENOL) 325 MG tablet Take 500 mg by mouth every 6 (six) hours as needed.    . B Complex Vitamins (VITAMIN B COMPLEX PO) Take 1 tablet by mouth daily.     . Cholecalciferol 2000 UNITS TABS Take 1 tablet by mouth  daily.    . magnesium 30 MG tablet Take 30 mg by mouth daily.      No current facility-administered medications  for this visit.      PHYSICAL EXAMINATION: ECOG PERFORMANCE STATUS: 0 - Asymptomatic Vitals:   09/02/17 0955  BP: 130/78  Pulse: 68  Resp: 18  Temp: 98.1 F (36.7 C)   Filed Weights   09/02/17 0955  Weight: 124 lb 4.8 oz (56.4 kg)    Physical Exam  Constitutional: She is oriented to person, place, and time. She appears well-developed and well-nourished. No distress.  HENT:  Head: Normocephalic and atraumatic.  Right Ear: External ear normal.  Left Ear: External ear normal.  Mouth/Throat: Oropharynx is clear and moist.  Eyes: Pupils are equal, round, and reactive to light. Conjunctivae and EOM are normal. No scleral icterus.  Neck: Normal range of motion. Neck supple.  Cardiovascular: Normal rate, regular rhythm and normal heart sounds.  Pulmonary/Chest: Effort normal and breath sounds normal. No respiratory distress. She has no wheezes. She has no rales. She exhibits no tenderness.  Abdominal: Soft. Bowel sounds are normal. She exhibits no distension and no mass. There is no tenderness.  Musculoskeletal: Normal range of motion. She exhibits no edema or deformity.  Lymphadenopathy:    She has no cervical adenopathy.  Neurological: She is alert and oriented to person, place, and time. No cranial nerve deficit. Coordination normal.  Skin: Skin is warm and dry. No rash noted.  Psychiatric: She has a normal mood and affect. Her behavior is normal. Thought content normal.     LABORATORY DATA:  I have reviewed the data as listed Lab Results  Component Value Date   WBC 6.1 08/26/2017   HGB 14.1 08/26/2017   HCT 40.7 08/26/2017   MCV 88.3 08/26/2017   PLT 244 08/26/2017   Recent Labs    03/11/17 1010 07/15/17 0925 07/15/17 1031  NA 139  --  137  K 4.2  --  3.5  CL 100  --  100*  CO2 31  --  29  GLUCOSE 99  --  106*  BUN 21  --  18  CREATININE 0.82 0.80  0.66  CALCIUM 9.2  --  9.1  GFRNONAA  --   --  >60  GFRAA  --   --  >60  PROT 6.7  --  6.9  ALBUMIN 4.2  --  4.1  AST 23  --  25  ALT 14  --  17  ALKPHOS 100  --  100  BILITOT 0.7  --  0.9       ASSESSMENT & PLAN:  84 year old female who has a history of left lower lobe primary lung adenocarcinoma present for evaluation of newly diagnosed 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 1. Malignant neoplasm of unspecified part of unspecified bronchus or lung ([place])   2. 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 Goals of care, counseling/discussion    Images including CT and PET scan were independently reviewed by me and discussed in details with patient and her health power of attorney.  I also discussed about the pathology which showed lung adenocarcinoma.  Reviewing her previous medical records from outside facility, she also had adenocarcinoma which was treated with lobectomy. Given that she has pleural involvement, she has M1 a disease, which put her into stage IV lung cancer.  Discussed about prognosis and disease extent/staging with patient. To complete her staging, I will obtain a brain MRI  #Goals of care discussed with patient.  She understands that her disease is not curable and a goal of care is palliative treatment to prolong her life and  improve life quality.  #Regarding lung cancer management, plan sent foundation one liquid biopsy test, and PDL 1 If she carries a mutation with targeted therapy options or if PDL 1 more than 50% she may be started on Tachycardia therapy or immunotherapy. Otherwise plan combination of chemotherapy and immunotherapy.  Discussed with patient Currently she does not have any symptoms.  We will discuss with her and to finalize treatment plans after liquid biopsy results and MRI results are back.  All questions were answered. The patient knows to call the clinic with any problems questions or concerns.  Return of visit:  2 to 3 weeks to  discuss about liquid biopsy and MRI. Thank you for this kind referral and the opportunity to participate in the care of this patient. A copy of today's note is routed to referring provider  Total face to face encounter time for this patient visit was 60 min. >50% of the time was  spent in counseling and coordination of care.    Earlie Server, MD, PhD Hematology Oncology [facility] Pager- [phone] 09/02/2017

## 2017-09-08 ENCOUNTER — Ambulatory Visit
Admission: RE | Admit: 2017-09-08 | Discharge: 2017-09-08 | Disposition: A | Discharge status: Home / Self Care | Payer: Medicare HMO | Source: Ambulatory Visit | Attending: Oncology | Admitting: Oncology

## 2017-09-08 DIAGNOSIS — J349 Unspecified disorder of nose and nasal sinuses: Secondary | ICD-10-CM | POA: Insufficient documentation

## 2017-09-08 DIAGNOSIS — M4802 Spinal stenosis, cervical region: Secondary | ICD-10-CM | POA: Diagnosis not present

## 2017-09-08 DIAGNOSIS — G319 Degenerative disease of nervous system, unspecified: Secondary | ICD-10-CM | POA: Insufficient documentation

## 2017-09-08 DIAGNOSIS — M4302 Spondylolysis, cervical region: Secondary | ICD-10-CM | POA: Diagnosis not present

## 2017-09-08 DIAGNOSIS — C3490 Malignant neoplasm of unspecified part of unspecified bronchus or lung: Secondary | ICD-10-CM

## 2017-09-08 LAB — POCT I-STAT CREATININE: CREATININE: 0.8 mg/dL (ref 0.44–1.00)

## 2017-09-08 MED ORDER — GADOBENATE DIMEGLUMINE 529 MG/ML IV SOLN
10.0000 mL | Freq: Once | INTRAVENOUS | Status: AC | PRN
  Administered 2017-09-08: 10 mL via INTRAVENOUS

## 2017-09-15 ENCOUNTER — Encounter: Payer: Self-pay | Admitting: Oncology

## 2017-09-15 ENCOUNTER — Telehealth: Payer: Self-pay | Admitting: *Deleted

## 2017-09-15 DIAGNOSIS — C3490 Malignant neoplasm of unspecified part of unspecified bronchus or lung: Secondary | ICD-10-CM | POA: Diagnosis not present

## 2017-09-15 NOTE — Telephone Encounter (Signed)
Called pt to inform that foundation liquid results will not be available until Monday 6/17 per [place]. Appt on Friday 6/14 has been moved to Tuesday 6/18 at 2:45pm. Pt aware of appt change.

## 2017-09-16 ENCOUNTER — Inpatient Hospital Stay: Payer: Medicare HMO | Admitting: Oncology

## 2017-09-20 ENCOUNTER — Encounter: Payer: Self-pay | Admitting: *Deleted

## 2017-09-20 ENCOUNTER — Inpatient Hospital Stay: Payer: Medicare HMO | Attending: Oncology | Admitting: Oncology

## 2017-09-20 ENCOUNTER — Other Ambulatory Visit: Payer: Self-pay

## 2017-09-20 ENCOUNTER — Encounter: Payer: Self-pay | Admitting: Oncology

## 2017-09-20 VITALS — BP 150/69 | HR 75 | Temp 96.5°F | Resp 18 | Wt 125.5 lb

## 2017-09-20 DIAGNOSIS — Z800 Family history of malignant neoplasm of digestive organs: Secondary | ICD-10-CM | POA: Diagnosis not present

## 2017-09-20 DIAGNOSIS — Z86010 Personal history of colonic polyps: Secondary | ICD-10-CM | POA: Diagnosis not present

## 2017-09-20 DIAGNOSIS — G4730 Sleep apnea, unspecified: Secondary | ICD-10-CM | POA: Insufficient documentation

## 2017-09-20 DIAGNOSIS — G8929 Other chronic pain: Secondary | ICD-10-CM | POA: Insufficient documentation

## 2017-09-20 DIAGNOSIS — E785 Hyperlipidemia, unspecified: Secondary | ICD-10-CM | POA: Diagnosis not present

## 2017-09-20 DIAGNOSIS — C3432 Malignant neoplasm of lower lobe, left bronchus or lung: Secondary | ICD-10-CM | POA: Diagnosis not present

## 2017-09-20 DIAGNOSIS — Z79899 Other long term (current) drug therapy: Secondary | ICD-10-CM

## 2017-09-20 DIAGNOSIS — C3490 Malignant neoplasm of unspecified part of unspecified bronchus or lung: Secondary | ICD-10-CM

## 2017-09-20 DIAGNOSIS — Z87442 Personal history of urinary calculi: Secondary | ICD-10-CM | POA: Insufficient documentation

## 2017-09-20 DIAGNOSIS — I701 Atherosclerosis of renal artery: Secondary | ICD-10-CM | POA: Diagnosis not present

## 2017-09-20 DIAGNOSIS — C782 Secondary malignant neoplasm of pleura: Secondary | ICD-10-CM | POA: Diagnosis not present

## 2017-09-20 DIAGNOSIS — Z7982 Long term (current) use of aspirin: Secondary | ICD-10-CM | POA: Insufficient documentation

## 2017-09-20 DIAGNOSIS — I10 Essential (primary) hypertension: Secondary | ICD-10-CM | POA: Diagnosis not present

## 2017-09-20 MED ORDER — ONDANSETRON HCL 8 MG PO TABS: 8.0000 mg | ORAL_TABLET | Freq: Two times a day (BID) | ORAL | 1 refills | Status: DC

## 2017-09-20 MED ORDER — LIDOCAINE-PRILOCAINE 2.5-2.5 % EX CREA
TOPICAL_CREAM | CUTANEOUS | 3 refills | Status: DC
Start: 2017-09-20 — End: 2017-09-26

## 2017-09-20 MED ORDER — PROCHLORPERAZINE MALEATE 10 MG PO TABS: 10.0000 mg | ORAL_TABLET | Freq: Four times a day (QID) | ORAL | 1 refills | Status: AC | PRN

## 2017-09-20 MED ORDER — DEXAMETHASONE 4 MG PO TABS: ORAL_TABLET | ORAL | 1 refills | Status: DC

## 2017-09-20 NOTE — Progress Notes (Signed)
Hematology/Oncology Consult note [facility] Telephone:([phone] Fax:([phone]   Patient Care Team: McLean-Scocuzza, Nino Glow, MD as PCP - General (Internal Medicine) Wellington Hampshire, MD as Consulting Physician (Cardiology) Telford Nab, RN as Registered Nurse  REFERRING PROVIDER: [place] VISIT Follow up for treatment of stage IV lung adenocarcinoma.   HISTORY OF PRESENTING ILLNESS:  Tamara Burton is a  84 y.o.  female with PMH listed below who was referred to me for evaluation of lung cancer.  Patient reports a remote history of lung cancer in 2003 and status post left lower lobectomy.  We obtained medical  records from [facility] in [place].  Reviewed pathology results.  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  Perihilar lymph node negative.  Peri-bronchial lymph node negative. Patient is accompanied by her friend who is her house health power of attorney Patient has noticed mild chronic left side chest wall pain which she described as a stitch, for about 6 months to a year.  Pain is very mild and she takes Tylenol or tramadol as needed.  Pain got little worse early this year. 07/15/2017 CT chest with contrast showed new left pleural process since prior CT from 2016.  Findings most suspicious for pleural tumor with complex pleural fluid.  Medial left upper lobe atelectasis without obvious tumor. 08/16/2017 PET scan showed multifocal left side pleural hypermetabolic is him and a soft tissue thickening, consistent with pleural metastasis Hyper metabolic soft tissue density within the lingula, suspicious for primary bronchogenic carcinoma.   There is also left adrenal hypermetabolic them is nonspecific and without CT correlate.  Otherwise no evidence of extra thoracic metastatic disease.  Right nephrolithiasis.  #  08/26/2017 CT biopsy of left lower chest pleura showed positive for adenocarcinoma.   Patient's case was discussed on tumor board on Sep 01, 2017.  Per pathology, there is no enough tissue for foundation one testing or Omniseq.  Patient was referred by pulmonology Dr. Shawna Orleans to me to discuss about management plan.  INTERVAL HISTORY Tamara Burton is a 84 y.o. female who has above history reviewed by me today presents for follow up visit for management of stage IV lung adenocarcinoma. Problems and complaints are listed below: Minimal left chest wall pain, stable.  Not better or worse. Shortness of breath, very mild.  Stable.   Denies weight loss, cough, hemoptysis, Overall patient remains very active 84 year old female.  She has chronic degenerative disease of spine and a chronic back pain.  Also history of renal arterial stenosis status post angiography in 2016.  Pressure fluctuates.  Lives by herself able to carry all ADLs.  She is accompanied by her friend who also is her healthcare power of attorney.  Review of Systems  Constitutional: Negative for chills, fever, malaise/fatigue and weight loss.  HENT: Negative for congestion, ear discharge, ear pain, nosebleeds, sinus pain and sore throat.   Eyes: Negative for double vision, photophobia, pain, discharge and redness.  Respiratory: Negative for cough, hemoptysis, sputum production, shortness of breath and wheezing.   Cardiovascular: Negative for chest pain, palpitations, orthopnea, claudication and leg swelling.  Gastrointestinal: Negative for abdominal pain, blood in stool, constipation, diarrhea, heartburn, melena, nausea and vomiting.  Genitourinary: Negative for dysuria, flank pain, frequency and hematuria.  Musculoskeletal: Negative for back pain, myalgias and neck pain.       Intermittent mild left lower anterior chest wall pain.  Skin: Negative  for itching and rash.  Neurological: Negative for dizziness, tingling, tremors, focal  weakness, weakness and headaches.  Endo/Heme/Allergies: Negative for environmental allergies. Does not bruise/bleed easily.  Psychiatric/Behavioral: Negative for depression and hallucinations. The patient is not nervous/anxious.     MEDICAL HISTORY:  Past Medical History:  Diagnosis Date  . Arthritis    "qwhere"  . Carotid artery disease ([place])   . Chronic lower back pain   . Colon polyps    benign per pt   . Epistaxis    with use of nasal sprays   . Headache    "lately; related to HTN" (04/24/2014)  . Hyperlipidemia   . Hypertension   . Hyponatremia    h/o  . Kidney stone   . Lung cancer ([place]) 2003   s/p left lower lobe resection  . Non-obstructive CAD    a. 04/2014 Cath: LM nl, LAD/D1/LCX/RCA min irregs, RPDA/PAV nl.  . Orthostatic hypotension   . OSA on CPAP    "wear it intermittently"  . PONV (postoperative nausea and vomiting)    "when I was younger"  . Renal artery stenosis ([place])    a. 04/2014 Renal angiography: LRA 95p, RRA 40ost. PTA of LRA deferred 2/2 guide cath induced Ao dissection;  01/27 6 x 15 mm Herculink stent to the Left-RA  follows with [facility] Dr. Ronalee Belts  . Sinus headache     SURGICAL HISTORY: Past Surgical History:  Procedure Laterality Date  . APPENDECTOMY    . BILATERAL OOPHORECTOMY Bilateral 2000's  . CARDIAC CATHETERIZATION  04/24/2014  . CATARACT EXTRACTION W/ INTRAOCULAR LENS  IMPLANT, BILATERAL Bilateral   . JOINT REPLACEMENT    . LEFT HEART CATHETERIZATION WITH CORONARY ANGIOGRAM N/A 04/24/2014   Procedure: LEFT HEART CATHETERIZATION WITH CORONARY ANGIOGRAM;  Surgeon: Wellington Hampshire, MD;  Location: [facility] CATH LAB;  Service: Cardiovascular;  Laterality: N/A;  . LUNG LOBECTOMY Left 2003   "lower lobe"  . PERIPHERAL VASCULAR CATHETERIZATION  05/01/2014   Procedure: RENAL INTERVENTION;  Surgeon: Wellington Hampshire, MD; 6 x 15 mm Herculink stent to the Left Renal Artery  . RENAL ANGIOGRAM  04/24/2014  . RENAL ANGIOGRAM N/A 04/24/2014   Procedure: RENAL  ANGIOGRAM;  Surgeon: Wellington Hampshire, MD;  Location: [facility] CATH LAB;  Service: Cardiovascular;  Laterality: N/A;  . RENAL ANGIOGRAM Left 05/01/2014   Procedure: RENAL ANGIOGRAM;  Surgeon: Wellington Hampshire, MD;  Location: [facility] CATH LAB;  Service: Cardiovascular;  Laterality: Left;  . REPLACEMENT TOTAL KNEE Left   . TOTAL HIP ARTHROPLASTY Right   . TUBAL LIGATION  ~ 1975  . VAGINAL DELIVERY  X 2  . VAGINAL HYSTERECTOMY  1980's    SOCIAL HISTORY: Social History   Socioeconomic History  . Marital status: Widowed    Spouse name: Not on file  . Number of children: Not on file  . Years of education: Not on file  . Highest education level: Not on file  Occupational History  . Not on file  Social Needs  . Financial resource strain: Not hard at all  . Food insecurity:    Worry: Never true    Inability: Never true  . Transportation needs:    Medical: No    Non-medical: No  Tobacco Use  . Smoking status: Never Smoker  . Smokeless tobacco: Never Used  . Tobacco comment: Father and husband both smokers  Substance and Sexual Activity  . Alcohol use: Yes    Comment: 04/24/2014 "glass of beer or wine q couple weeks"  .  Drug use: No  . Sexual activity: Not on file  Lifestyle  . Physical activity:    Days per week: Not on file    Minutes per session: Not on file  . Stress: Not on file  Relationships  . Social connections:    Talks on phone: Not on file    Gets together: Not on file    Attends religious service: Not on file    Active member of club or organization: Not on file    Attends meetings of clubs or organizations: Not on file    Relationship status: Not on file  . Intimate partner violence:    Fear of current or ex partner: Not on file    Emotionally abused: Not on file    Physically abused: Not on file    Forced sexual activity: Not on file  Other Topics Concern  . Not on file  Social History Narrative   Lives in [place]. Daughter nearby. Used to live in [place]     Normal ADLs as of 02/2017 and no h/o falls    Likes to walk   Lives alone     FAMILY HISTORY: Family History  Problem Relation Age of Onset  . Parkinsonism Mother   . Alzheimer's disease Mother   . Sarcoidosis Brother   . Stroke Maternal Grandmother   . Stomach cancer Paternal Grandmother   . Diabetes Son   . Diabetes Daughter     ALLERGIES:  is allergic to atorvastatin; penicillins; sulfa antibiotics; and amlodipine.  MEDICATIONS:  Current Outpatient Medications  Medication Sig Dispense Refill  . acetaminophen (TYLENOL) 500 MG tablet Take 500 mg by mouth every 6 (six) hours as needed.    Marland Kitchen albuterol (PROVENTIL HFA;VENTOLIN HFA) 108 (90 Base) MCG/ACT inhaler Inhale 2 puffs into the lungs every 6 (six) hours as needed for wheezing or shortness of breath. 1 Inhaler 0  . Ascorbic Acid (VITAMIN C CR) 1500 MG TBCR Take by mouth daily.    Marland Kitchen aspirin 81 MG tablet Take 81 mg by mouth daily.    . Biotin 2500 MCG CAPS Take 1 capsule by mouth daily.    . carvedilol (COREG) 12.5 MG tablet Take 1 tablet (12.5 mg total) by mouth 2 (two) times daily with a meal. (Patient taking differently: Take 3.125 mg by mouth 2 (two) times daily with a meal. ) 180 tablet 3  . Cholecalciferol (VITAMIN D3) 5000 units CAPS Take 1 capsule by mouth daily.    . Coenzyme Q10 (CO Q-10) 200 MG CAPS Take 1 capsule by mouth 2 (two) times daily.    Marland Kitchen ezetimibe (ZETIA) 10 MG tablet Take 10 mg by mouth daily.    Marland Kitchen glucosamine-chondroitin 500-400 MG tablet Take 1 tablet by mouth 2 (two) times daily.    . hydrALAZINE (APRESOLINE) 25 MG tablet Take 1 tablet (25 mg total) by mouth 3 (three) times daily. (Patient taking differently: Take 25 mg by mouth as needed. ) 90 tablet 6  . losartan-hydrochlorothiazide (HYZAAR) 100-25 MG tablet Take 1 tablet by mouth daily. 90 tablet 0  . Magnesium 500 MG CAPS Take 1 capsule by mouth daily.    . Red Yeast Rice 600 MG CAPS Take 1,200 mg by mouth 2 (two) times daily.     . traMADol  (ULTRAM) 50 MG tablet Take 1 tablet (50 mg total) by mouth daily as needed. For back pain/joint pain 90 tablet 0  . acetaminophen (TYLENOL) 325 MG tablet Take 500 mg by mouth  every 6 (six) hours as needed.    . B Complex Vitamins (VITAMIN B COMPLEX PO) Take 1 tablet by mouth daily.     . Cholecalciferol 2000 UNITS TABS Take 1 tablet by mouth daily.    . magnesium 30 MG tablet Take 30 mg by mouth daily.      No current facility-administered medications for this visit.      PHYSICAL EXAMINATION: ECOG PERFORMANCE STATUS: 0 - Asymptomatic Vitals:   09/20/17 1459 09/20/17 1502  BP: (!) 150/69   Pulse: 75   Resp: 18   Temp: (!) 96.5 F (35.8 C)   SpO2:  96%   Filed Weights   09/20/17 1459  Weight: 125 lb 8 oz (56.9 kg)    Physical Exam  Constitutional: She is oriented to person, place, and time. She appears well-developed and well-nourished. No distress.  HENT:  Head: Normocephalic and atraumatic.  Right Ear: External ear normal.  Left Ear: External ear normal.  Mouth/Throat: Oropharynx is clear and moist.  Eyes: Pupils are equal, round, and reactive to light. Conjunctivae and EOM are normal. No scleral icterus.  Neck: Normal range of motion. Neck supple.  Cardiovascular: Normal rate, regular rhythm and normal heart sounds.  Pulmonary/Chest: Effort normal and breath sounds normal. No respiratory distress. She has no wheezes. She has no rales. She exhibits no tenderness.  Abdominal: Soft. Bowel sounds are normal. She exhibits no distension and no mass. There is no tenderness.  Musculoskeletal: Normal range of motion. She exhibits no edema or deformity.  Lymphadenopathy:    She has no cervical adenopathy.  Neurological: She is alert and oriented to person, place, and time. No cranial nerve deficit. Coordination normal.  Skin: Skin is warm and dry. No rash noted.  Psychiatric: She has a normal mood and affect. Her behavior is normal. Thought content normal.     LABORATORY DATA:    I have reviewed the data as listed Lab Results  Component Value Date   WBC 6.1 08/26/2017   HGB 14.1 08/26/2017   HCT 40.7 08/26/2017   MCV 88.3 08/26/2017   PLT 244 08/26/2017   Recent Labs    03/11/17 1010 07/15/17 0925 07/15/17 1031 09/08/17 1022  NA 139  --  137  --   K 4.2  --  3.5  --   CL 100  --  100*  --   CO2 31  --  29  --   GLUCOSE 99  --  106*  --   BUN 21  --  18  --   CREATININE 0.82 0.80 0.66 0.80  CALCIUM 9.2  --  9.1  --   GFRNONAA  --   --  >60  --   GFRAA  --   --  >60  --   PROT 6.7  --  6.9  --   ALBUMIN 4.2  --  4.1  --   AST 23  --  25  --   ALT 14  --  17  --   ALKPHOS 100  --  100  --   BILITOT 0.7  --  0.9  --        ASSESSMENT & PLAN:  84 year old female who has a history of left lower lobe primary lung adenocarcinoma present for evaluation of newly diagnosed 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 1. Malignant neoplasm of unspecified part of unspecified bronchus or lung ([place])   Cancer Staging Malignant neoplasm of unspecified part of unspecified bronchus or  lung [facility]) Staging form: Lung, AJCC 8th Edition - Clinical stage from 09/02/2017: Stage IV (cT2a, cN0, pM1a) - Signed by Earlie Server, MD on 09/02/2017  Brain MRI was discussed with patient.  MRI showed no intracranial metastatic disease. Liquid biopsy results from foundation was reviewed and discussed with patient. 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  There are phase 1 and 2 clinical trials available, outside of [place]. PDL 1 was tested and not reported indicating 0 percentage of expression.  Results were reviewed with patient and I recommend combination of chemotherapy with carboplatin/Alimta/Keytruda.  I explained to the patient the risks and benefits of chemotherapy including all but not limited to hair loss, mouth sore, nausea, vomiting, low blood counts, bleeding, and risk of life threatening infection and even death, secondary  malignancy etc.    I discussed the mechanism of action and rationale of using immunotherapy.  The goal of therapy is palliative; and length of treatments are likely ongoing/based upon the results of the scans. Discussed the potential side effects of immunotherapy including but not limited to diarrhea; skin rash; respiratory failure, neurotoxicity, elevated LFTs/endocrine abnormalities etc.  Patient voices understanding and willing to proceed chemotherapy.   # Chemotherapy education; referred to vascular surgery for port placement. Antiemetics-Zofran and Compazine; EMLA cream sent to pharmacy   #Goals of care discussed with patient.  Patient understand that her disease is not curable and treatment is with palliative intent.    All questions were answered. The patient knows to call the clinic with any problems questions or concerns.  Return of visit:  day 1 of chemotherapy.  Total face to face encounter time for this patient visit was 40 min. >50% of the time was  spent in counseling and coordination of care.    Earlie Server, MD, PhD Hematology Oncology [facility] Pager- [phone] 09/20/2017

## 2017-09-20 NOTE — Progress Notes (Signed)
START ON PATHWAY REGIMEN - Non-Small Cell Lung     A cycle is every 21 days:     Pembrolizumab      Pemetrexed      Carboplatin   **Always confirm dose/schedule in your pharmacy ordering system**  Patient Characteristics: Stage IV Metastatic, Nonsquamous, Initial Chemotherapy/Immunotherapy, PS = 0, 1, PD-L1 Expression Positive 1-49% (TPS) / Negative / Not Tested / Awaiting Test Results AJCC T Category: T2a Current Disease Status: Distant Metastases AJCC N Category: NX AJCC M Category: M1a AJCC 8 Stage Grouping: IVA Histology: Nonsquamous Cell ROS1 Rearrangement Status: Negative T790M Mutation Status: Not Applicable - EGFR Mutation Negative/Unknown Other Mutations/Biomarkers: Yes NTRK Gene Fusion Status: Negative PD-L1 Expression Status: PD-L1 Negative Chemotherapy/Immunotherapy LOT: Initial Chemotherapy/Immunotherapy Molecular Targeted Therapy: Not Appropriate ALK Translocation Status: Negative EGFR Mutation Status: Negative/Wild Type BRAF [id number] Mutation Status: Negative Performance Status: PS = 0, 1 Intent of Therapy: Non-Curative / Palliative Intent, Discussed with Patient

## 2017-09-20 NOTE — Progress Notes (Signed)
Patient here today for follow up.   

## 2017-09-21 ENCOUNTER — Encounter (INDEPENDENT_AMBULATORY_CARE_PROVIDER_SITE_OTHER): Payer: Self-pay

## 2017-09-21 ENCOUNTER — Other Ambulatory Visit (INDEPENDENT_AMBULATORY_CARE_PROVIDER_SITE_OTHER): Payer: Self-pay | Admitting: Vascular Surgery

## 2017-09-21 NOTE — Progress Notes (Signed)
  Oncology Nurse Navigator Documentation  Navigator Location: [facility]-Med Onc (09/20/17 1500)   )Navigator Encounter Type: [facility] Follow-up;Diagnostic Results (09/20/17 1500)                     Patient Visit Type: MedOnc (09/20/17 1500) Treatment Phase: Pre-Tx/Tx Discussion (09/20/17 1500) Barriers/Navigation Needs: Coordination of Care (09/20/17 1500)   Interventions: Coordination of Care (09/20/17 1500)   Coordination of Care: Appts;Chemo (09/20/17 1500)         met with patient during follow up visit with Dr. Tasia Catchings to discuss results from foundation liquid biopsy. All questions answered at the time of visit. Dr. Tasia Catchings reviewed with pt the need to start combination chemo+immunotherapy. Assisted pt in getting scheduled for treatment education and follow up visit to initiate treatment. Reviewed upcoming appts and informed that vascular surgeon's office will call her to schedule port placement. Pt verbalized understanding. Instructed to call if has any further questions or needs.          Time Spent with Patient: 60 (09/20/17 1500)

## 2017-09-21 NOTE — Patient Instructions (Signed)
Pembrolizumab injection What is this medicine? PEMBROLIZUMAB (pem broe liz ue mab) is a monoclonal antibody. It is used to treat melanoma, head and neck cancer, Hodgkin lymphoma, non-small cell lung cancer, urothelial cancer, stomach cancer, and cancers that have a certain genetic condition. This medicine may be used for other purposes; ask your health care provider or pharmacist if you have questions. COMMON BRAND NAME(S): Keytruda What should I tell my health care provider before I take this medicine? They need to know if you have any of these conditions: -diabetes -immune system problems -inflammatory bowel disease -liver disease -lung or breathing disease -lupus -organ transplant -an unusual or allergic reaction to pembrolizumab, other medicines, foods, dyes, or preservatives -pregnant or trying to get pregnant -breast-feeding How should I use this medicine? This medicine is for infusion into a vein. It is given by a health care professional in a hospital or clinic setting. A special MedGuide will be given to you before each treatment. Be sure to read this information carefully each time. Talk to your pediatrician regarding the use of this medicine in children. While this drug may be prescribed for selected conditions, precautions do apply. Overdosage: If you think you have taken too much of this medicine contact a poison control center or emergency room at once. NOTE: This medicine is only for you. Do not share this medicine with others. What if I miss a dose? It is important not to miss your dose. Call your doctor or health care professional if you are unable to keep an appointment. What may interact with this medicine? Interactions have not been studied. Give your health care provider a list of all the medicines, herbs, non-prescription drugs, or dietary supplements you use. Also tell them if you smoke, drink alcohol, or use illegal drugs. Some items may interact with your  medicine. This list may not describe all possible interactions. Give your health care provider a list of all the medicines, herbs, non-prescription drugs, or dietary supplements you use. Also tell them if you smoke, drink alcohol, or use illegal drugs. Some items may interact with your medicine. What should I watch for while using this medicine? Your condition will be monitored carefully while you are receiving this medicine. You may need blood work done while you are taking this medicine. Do not become pregnant while taking this medicine or for 4 months after stopping it. Women should inform their doctor if they wish to become pregnant or think they might be pregnant. There is a potential for serious side effects to an unborn child. Talk to your health care professional or pharmacist for more information. Do not breast-feed an infant while taking this medicine or for 4 months after the last dose. What side effects may I notice from receiving this medicine? Side effects that you should report to your doctor or health care professional as soon as possible: -allergic reactions like skin rash, itching or hives, swelling of the face, lips, or tongue -bloody or black, tarry -breathing problems -changes in vision -chest pain -chills -constipation -cough -dizziness or feeling faint or lightheaded -fast or irregular heartbeat -fever -flushing -hair loss -low blood counts - this medicine may decrease the number of white blood cells, red blood cells and platelets. You may be at increased risk for infections and bleeding. -muscle pain -muscle weakness -persistent headache -signs and symptoms of high blood sugar such as dizziness; dry mouth; dry skin; fruity breath; nausea; stomach pain; increased hunger or thirst; increased urination -signs and symptoms of kidney  injury like trouble passing urine or change in the amount of urine -signs and symptoms of liver injury like dark urine, light-colored  stools, loss of appetite, nausea, right upper belly pain, yellowing of the eyes or skin -stomach pain -sweating -weight loss Side effects that usually do not require medical attention (report to your doctor or health care professional if they continue or are bothersome): -decreased appetite -diarrhea -tiredness This list may not describe all possible side effects. Call your doctor for medical advice about side effects. You may report side effects to FDA at 1-800-FDA-1088. Where should I keep my medicine? This drug is given in a hospital or clinic and will not be stored at home. NOTE: This sheet is a summary. It may not cover all possible information. If you have questions about this medicine, talk to your doctor, pharmacist, or health care provider.  2018 Elsevier/Gold Standard (2015-12-30 12:29:36) Pemetrexed injection What is this medicine? PEMETREXED (PEM e TREX ed) is a chemotherapy drug used to treat lung cancers like non-small cell lung cancer and mesothelioma. It may also be used to treat other cancers. This medicine may be used for other purposes; ask your health care provider or pharmacist if you have questions. COMMON BRAND NAME(S): Alimta What should I tell my health care provider before I take this medicine? They need to know if you have any of these conditions: -infection (especially a virus infection such as chickenpox, cold sores, or herpes) -kidney disease -low blood counts, like low white cell, platelet, or red cell counts -lung or breathing disease, like asthma -radiation therapy -an unusual or allergic reaction to pemetrexed, other medicines, foods, dyes, or preservative -pregnant or trying to get pregnant -breast-feeding How should I use this medicine? This drug is given as an infusion into a vein. It is administered in a hospital or clinic by a specially trained health care professional. Talk to your pediatrician regarding the use of this medicine in children.  Special care may be needed. Overdosage: If you think you have taken too much of this medicine contact a poison control center or emergency room at once. NOTE: This medicine is only for you. Do not share this medicine with others. What if I miss a dose? It is important not to miss your dose. Call your doctor or health care professional if you are unable to keep an appointment. What may interact with this medicine? This medicine may interact with the following medications: -Ibuprofen This list may not describe all possible interactions. Give your health care provider a list of all the medicines, herbs, non-prescription drugs, or dietary supplements you use. Also tell them if you smoke, drink alcohol, or use illegal drugs. Some items may interact with your medicine. What should I watch for while using this medicine? Visit your doctor for checks on your progress. This drug may make you feel generally unwell. This is not uncommon, as chemotherapy can affect healthy cells as well as cancer cells. Report any side effects. Continue your course of treatment even though you feel ill unless your doctor tells you to stop. In some cases, you may be given additional medicines to help with side effects. Follow all directions for their use. Call your doctor or health care professional for advice if you get a fever, chills or sore throat, or other symptoms of a cold or flu. Do not treat yourself. This drug decreases your body's ability to fight infections. Try to avoid being around people who are sick. This medicine may increase your  risk to bruise or bleed. Call your doctor or health care professional if you notice any unusual bleeding. Be careful brushing and flossing your teeth or using a toothpick because you may get an infection or bleed more easily. If you have any dental work done, tell your dentist you are receiving this medicine. Avoid taking products that contain aspirin, acetaminophen, ibuprofen, naproxen,  or ketoprofen unless instructed by your doctor. These medicines may hide a fever. Call your doctor or health care professional if you get diarrhea or mouth sores. Do not treat yourself. To protect your kidneys, drink water or other fluids as directed while you are taking this medicine. Do not become pregnant while taking this medicine or for 6 months after stopping it. Women should inform their doctor if they wish to become pregnant or think they might be pregnant. Men should not father a child while taking this medicine and for 3 months after stopping it. This may interfere with the ability to father a child. You should talk to your doctor or health care professional if you are concerned about your fertility. There is a potential for serious side effects to an unborn child. Talk to your health care professional or pharmacist for more information. Do not breast-feed an infant while taking this medicine or for 1 week after stopping it. What side effects may I notice from receiving this medicine? Side effects that you should report to your doctor or health care professional as soon as possible: -allergic reactions like skin rash, itching or hives, swelling of the face, lips, or tongue -breathing problems -redness, blistering, peeling or loosening of the skin, including inside the mouth -signs and symptoms of bleeding such as bloody or black, tarry stools; red or dark-brown urine; spitting up blood or brown material that looks like coffee grounds; red spots on the skin; unusual bruising or bleeding from the eye, gums, or nose -signs and symptoms of infection like fever or chills; cough; sore throat; pain or trouble passing urine -signs and symptoms of kidney injury like trouble passing urine or change in the amount of urine -signs and symptoms of liver injury like dark yellow or brown urine; general ill feeling or flu-like symptoms; light-colored stools; loss of appetite; nausea; right upper belly pain;  unusually weak or tired; yellowing of the eyes or skin Side effects that usually do not require medical attention (report to your doctor or health care professional if they continue or are bothersome): -constipation -dizziness -mouth sores -nausea, vomiting -pain, tingling, numbness in the hands or feet -unusually weak or tired This list may not describe all possible side effects. Call your doctor for medical advice about side effects. You may report side effects to FDA at 1-800-FDA-1088. Where should I keep my medicine? This drug is given in a hospital or clinic and will not be stored at home. NOTE: This sheet is a summary. It may not cover all possible information. If you have questions about this medicine, talk to your doctor, pharmacist, or health care provider.  2018 Elsevier/Gold Standard (2016-01-20 18:51:46) Carboplatin injection What is this medicine? CARBOPLATIN (KAR boe pla tin) is a chemotherapy drug. It targets fast dividing cells, like cancer cells, and causes these cells to die. This medicine is used to treat ovarian cancer and many other cancers. This medicine may be used for other purposes; ask your health care provider or pharmacist if you have questions. COMMON BRAND NAME(S): Paraplatin What should I tell my health care provider before I take this medicine?  They need to know if you have any of these conditions: -blood disorders -hearing problems -kidney disease -recent or ongoing radiation therapy -an unusual or allergic reaction to carboplatin, cisplatin, other chemotherapy, other medicines, foods, dyes, or preservatives -pregnant or trying to get pregnant -breast-feeding How should I use this medicine? This drug is usually given as an infusion into a vein. It is administered in a hospital or clinic by a specially trained health care professional. Talk to your pediatrician regarding the use of this medicine in children. Special care may be needed. Overdosage: If you  think you have taken too much of this medicine contact a poison control center or emergency room at once. NOTE: This medicine is only for you. Do not share this medicine with others. What if I miss a dose? It is important not to miss a dose. Call your doctor or health care professional if you are unable to keep an appointment. What may interact with this medicine? -medicines for seizures -medicines to increase blood counts like filgrastim, pegfilgrastim, sargramostim -some antibiotics like amikacin, gentamicin, neomycin, streptomycin, tobramycin -vaccines Talk to your doctor or health care professional before taking any of these medicines: -acetaminophen -aspirin -ibuprofen -ketoprofen -naproxen This list may not describe all possible interactions. Give your health care provider a list of all the medicines, herbs, non-prescription drugs, or dietary supplements you use. Also tell them if you smoke, drink alcohol, or use illegal drugs. Some items may interact with your medicine. What should I watch for while using this medicine? Your condition will be monitored carefully while you are receiving this medicine. You will need important blood work done while you are taking this medicine. This drug may make you feel generally unwell. This is not uncommon, as chemotherapy can affect healthy cells as well as cancer cells. Report any side effects. Continue your course of treatment even though you feel ill unless your doctor tells you to stop. In some cases, you may be given additional medicines to help with side effects. Follow all directions for their use. Call your doctor or health care professional for advice if you get a fever, chills or sore throat, or other symptoms of a cold or flu. Do not treat yourself. This drug decreases your body's ability to fight infections. Try to avoid being around people who are sick. This medicine may increase your risk to bruise or bleed. Call your doctor or health care  professional if you notice any unusual bleeding. Be careful brushing and flossing your teeth or using a toothpick because you may get an infection or bleed more easily. If you have any dental work done, tell your dentist you are receiving this medicine. Avoid taking products that contain aspirin, acetaminophen, ibuprofen, naproxen, or ketoprofen unless instructed by your doctor. These medicines may hide a fever. Do not become pregnant while taking this medicine. Women should inform their doctor if they wish to become pregnant or think they might be pregnant. There is a potential for serious side effects to an unborn child. Talk to your health care professional or pharmacist for more information. Do not breast-feed an infant while taking this medicine. What side effects may I notice from receiving this medicine? Side effects that you should report to your doctor or health care professional as soon as possible: -allergic reactions like skin rash, itching or hives, swelling of the face, lips, or tongue -signs of infection - fever or chills, cough, sore throat, pain or difficulty passing urine -signs of decreased platelets or bleeding -  bruising, pinpoint red spots on the skin, black, tarry stools, nosebleeds -signs of decreased red blood cells - unusually weak or tired, fainting spells, lightheadedness -breathing problems -changes in hearing -changes in vision -chest pain -high blood pressure -low blood counts - This drug may decrease the number of white blood cells, red blood cells and platelets. You may be at increased risk for infections and bleeding. -nausea and vomiting -pain, swelling, redness or irritation at the injection site -pain, tingling, numbness in the hands or feet -problems with balance, talking, walking -trouble passing urine or change in the amount of urine Side effects that usually do not require medical attention (report to your doctor or health care professional if they  continue or are bothersome): -hair loss -loss of appetite -metallic taste in the mouth or changes in taste This list may not describe all possible side effects. Call your doctor for medical advice about side effects. You may report side effects to FDA at 1-800-FDA-1088. Where should I keep my medicine? This drug is given in a hospital or clinic and will not be stored at home. NOTE: This sheet is a summary. It may not cover all possible information. If you have questions about this medicine, talk to your doctor, pharmacist, or health care provider.  2018 Elsevier/Gold Standard (2007-06-27 14:38:05)

## 2017-09-22 ENCOUNTER — Inpatient Hospital Stay: Payer: Medicare HMO

## 2017-09-25 MED ORDER — CLINDAMYCIN PHOSPHATE 300 MG/50ML IV SOLN
300.0000 mg | Freq: Once | INTRAVENOUS | Status: AC
  Administered 2017-09-26: 300 mg via INTRAVENOUS

## 2017-09-26 ENCOUNTER — Telehealth: Payer: Self-pay | Admitting: *Deleted

## 2017-09-26 ENCOUNTER — Encounter
Admission: RE | Disposition: A | Discharge status: Home / Self Care | Payer: Self-pay | Source: Ambulatory Visit | Attending: Vascular Surgery

## 2017-09-26 ENCOUNTER — Ambulatory Visit
Admission: RE | Admit: 2017-09-26 | Discharge: 2017-09-26 | Disposition: A | Discharge status: Home / Self Care | Payer: Medicare HMO | Source: Ambulatory Visit | Attending: Vascular Surgery | Admitting: Vascular Surgery

## 2017-09-26 DIAGNOSIS — Z86010 Personal history of colonic polyps: Secondary | ICD-10-CM | POA: Diagnosis not present

## 2017-09-26 DIAGNOSIS — I951 Orthostatic hypotension: Secondary | ICD-10-CM | POA: Diagnosis not present

## 2017-09-26 DIAGNOSIS — Z888 Allergy status to other drugs, medicaments and biological substances status: Secondary | ICD-10-CM | POA: Insufficient documentation

## 2017-09-26 DIAGNOSIS — Z96652 Presence of left artificial knee joint: Secondary | ICD-10-CM | POA: Diagnosis not present

## 2017-09-26 DIAGNOSIS — Z9842 Cataract extraction status, left eye: Secondary | ICD-10-CM | POA: Insufficient documentation

## 2017-09-26 DIAGNOSIS — Z800 Family history of malignant neoplasm of digestive organs: Secondary | ICD-10-CM | POA: Insufficient documentation

## 2017-09-26 DIAGNOSIS — Z882 Allergy status to sulfonamides status: Secondary | ICD-10-CM | POA: Diagnosis not present

## 2017-09-26 DIAGNOSIS — I10 Essential (primary) hypertension: Secondary | ICD-10-CM | POA: Insufficient documentation

## 2017-09-26 DIAGNOSIS — Z90710 Acquired absence of both cervix and uterus: Secondary | ICD-10-CM | POA: Diagnosis not present

## 2017-09-26 DIAGNOSIS — Z9841 Cataract extraction status, right eye: Secondary | ICD-10-CM | POA: Insufficient documentation

## 2017-09-26 DIAGNOSIS — Z880 Allergy status to penicillin: Secondary | ICD-10-CM | POA: Diagnosis not present

## 2017-09-26 DIAGNOSIS — M1990 Unspecified osteoarthritis, unspecified site: Secondary | ICD-10-CM | POA: Diagnosis not present

## 2017-09-26 DIAGNOSIS — I6529 Occlusion and stenosis of unspecified carotid artery: Secondary | ICD-10-CM | POA: Insufficient documentation

## 2017-09-26 DIAGNOSIS — I2510 Atherosclerotic heart disease of native coronary artery without angina pectoris: Secondary | ICD-10-CM | POA: Insufficient documentation

## 2017-09-26 DIAGNOSIS — G4733 Obstructive sleep apnea (adult) (pediatric): Secondary | ICD-10-CM | POA: Diagnosis not present

## 2017-09-26 DIAGNOSIS — C3490 Malignant neoplasm of unspecified part of unspecified bronchus or lung: Secondary | ICD-10-CM | POA: Diagnosis not present

## 2017-09-26 DIAGNOSIS — Z9889 Other specified postprocedural states: Secondary | ICD-10-CM | POA: Insufficient documentation

## 2017-09-26 DIAGNOSIS — Z902 Acquired absence of lung [part of]: Secondary | ICD-10-CM | POA: Diagnosis not present

## 2017-09-26 DIAGNOSIS — Z820 Family history of epilepsy and other diseases of the nervous system: Secondary | ICD-10-CM | POA: Insufficient documentation

## 2017-09-26 DIAGNOSIS — E785 Hyperlipidemia, unspecified: Secondary | ICD-10-CM | POA: Diagnosis not present

## 2017-09-26 DIAGNOSIS — Z90722 Acquired absence of ovaries, bilateral: Secondary | ICD-10-CM | POA: Diagnosis not present

## 2017-09-26 DIAGNOSIS — Z955 Presence of coronary angioplasty implant and graft: Secondary | ICD-10-CM | POA: Diagnosis not present

## 2017-09-26 DIAGNOSIS — Z87442 Personal history of urinary calculi: Secondary | ICD-10-CM | POA: Insufficient documentation

## 2017-09-26 DIAGNOSIS — Z96641 Presence of right artificial hip joint: Secondary | ICD-10-CM | POA: Diagnosis not present

## 2017-09-26 DIAGNOSIS — Z79899 Other long term (current) drug therapy: Secondary | ICD-10-CM | POA: Insufficient documentation

## 2017-09-26 HISTORY — PX: PORTA CATH INSERTION: CATH118285

## 2017-09-26 SURGERY — PORTA CATH INSERTION
Anesthesia: Moderate Sedation

## 2017-09-26 MED ORDER — FENTANYL CITRATE (PF) 100 MCG/2ML IJ SOLN
INTRAMUSCULAR | Status: AC
  Filled 2017-09-26: qty 2

## 2017-09-26 MED ORDER — HEPARIN (PORCINE) IN NACL 1000-0.9 UT/500ML-% IV SOLN
INTRAVENOUS | Status: AC
  Filled 2017-09-26: qty 500

## 2017-09-26 MED ORDER — SODIUM CHLORIDE 0.9 % IV SOLN
INTRAVENOUS | Status: AC | PRN
  Administered 2017-09-26: 500 mL via INTRAVENOUS

## 2017-09-26 MED ORDER — SODIUM CHLORIDE 0.9 % IV SOLN
INTRAVENOUS | Status: DC
  Administered 2017-09-26: 1000 mL via INTRAVENOUS

## 2017-09-26 MED ORDER — FENTANYL CITRATE (PF) 100 MCG/2ML IJ SOLN
INTRAMUSCULAR | Status: DC | PRN
  Administered 2017-09-26: 50 ug via INTRAVENOUS

## 2017-09-26 MED ORDER — MIDAZOLAM HCL 5 MG/5ML IJ SOLN
INTRAMUSCULAR | Status: AC
  Filled 2017-09-26: qty 5

## 2017-09-26 MED ORDER — CLINDAMYCIN PHOSPHATE 300 MG/50ML IV SOLN
INTRAVENOUS | Status: AC
  Filled 2017-09-26: qty 50

## 2017-09-26 MED ORDER — SODIUM CHLORIDE 0.9 % IV SOLN
Freq: Once | INTRAVENOUS | Status: DC
  Filled 2017-09-26: qty 2

## 2017-09-26 MED ORDER — MIDAZOLAM HCL 2 MG/2ML IJ SOLN
INTRAMUSCULAR | Status: DC | PRN
  Administered 2017-09-26: 2 mg via INTRAVENOUS

## 2017-09-26 MED ORDER — LIDOCAINE-EPINEPHRINE (PF) 1 %-1:200000 IJ SOLN
INTRAMUSCULAR | Status: AC
  Filled 2017-09-26: qty 30

## 2017-09-26 MED ORDER — LIDOCAINE-PRILOCAINE 2.5-2.5 % EX CREA: TOPICAL_CREAM | CUTANEOUS | 3 refills | Status: AC

## 2017-09-26 SURGICAL SUPPLY — 9 items
HANDLE YANKAUER SUCT BULB TIP (MISCELLANEOUS) ×2 IMPLANT
KIT PORT POWER 8FR ISP CVUE (Port) ×2 IMPLANT
PACK ANGIOGRAPHY (CUSTOM PROCEDURE TRAY) ×2 IMPLANT
PAD GROUND ADULT SPLIT (MISCELLANEOUS) ×2 IMPLANT
PENCIL ELECTRO HAND CTR (MISCELLANEOUS) ×2 IMPLANT
SUT MNCRL AB 4-0 PS2 18 (SUTURE) ×2 IMPLANT
SUT PROLENE 0 CT 1 30 (SUTURE) ×2 IMPLANT
SUT VIC AB 3-0 SH 27 (SUTURE) ×1
SUT VIC AB 3-0 SH 27X BRD (SUTURE) ×1 IMPLANT

## 2017-09-26 NOTE — H&P (Signed)
[facility] History & Physical Update  The patient was interviewed and re-examined.  The patient's previous History and Physical has been reviewed and is unchanged.  There is no change in the plan of care. We plan to proceed with the scheduled procedure.  Leotis Pain, MD  09/26/2017, 8:19 AM

## 2017-09-26 NOTE — Telephone Encounter (Signed)
Pt picked up prescriptions called in for compazine and zofran but EMLA cream was not received by pharmacy. Rx has been electronically sent again. Pt aware and informed should be at pharmacy. Pt states will call back if unable to pick up.

## 2017-09-26 NOTE — Op Note (Signed)
      [facility]       Operative Note  Date: 09/26/2017  Preoperative diagnosis:  1. Lung cancer  Postoperative diagnosis:  Same as above  Procedures: #1. Ultrasound guidance for vascular access to the right internal jugular vein. #2. Fluoroscopic guidance for placement of catheter. #3. Placement of CT compatible Port-A-Cath, right internal jugular vein.  Surgeon: Leotis Pain, MD.   Anesthesia: Local with moderate conscious sedation for approximately 20  minutes using 2 mg of Versed and 50 mcg of Fentanyl  Fluoroscopy time: less than 1 minute  Contrast used: 0  Estimated blood loss: 3 cc  Indication for the procedure:  The patient is a 83 y.o.female with lung cancer.  The patient needs a Port-A-Cath for durable venous access, chemotherapy, lab draws, and CT scans. We are asked to place this. Risks and benefits were discussed and informed consent was obtained.  Description of procedure: The patient was brought to the vascular and interventional radiology suite.  Moderate conscious sedation was administered throughout the procedure during a face to face encounter with the patient with my supervision of the RN administering medicines and monitoring the patient's vital signs, pulse oximetry, telemetry and mental status throughout from the start of the procedure until the patient was taken to the recovery room. The right neck chest and shoulder were sterilely prepped and draped, and a sterile surgical field was created. Ultrasound was used to help visualize a patent right internal jugular vein. This was then accessed under direct ultrasound guidance without difficulty with the Seldinger needle and a permanent image was recorded. A J-wire was placed. After skin nick and dilatation, the peel-away sheath was then placed over the wire. I then anesthetized an area under the clavicle approximately 1-2 fingerbreadths. A transverse incision was created and an inferior pocket was  created with electrocautery and blunt dissection. The port was then brought onto the field, placed into the pocket and secured to the chest wall with 2 Prolene sutures. The catheter was connected to the port and tunneled from the subclavicular incision to the access site. Fluoroscopic guidance was then used to cut the catheter to an appropriate length. The catheter was then placed through the peel-away sheath and the peel-away sheath was removed. The catheter tip was parked in excellent location under fluorocoscopic guidance in the cavoatrial junction. The pocket was then irrigated with antibiotic impregnated saline and the wound was closed with a running 3-0 Vicryl and a 4-0 Monocryl. The access incision was closed with a single 4-0 Monocryl. The Huber needle was used to withdraw blood and flush the port with heparinized saline. Dermabond was then placed as a dressing. The patient tolerated the procedure well and was taken to the recovery room in stable condition.   Leotis Pain 09/26/2017 9:52 AM   This note was created with Dragon Medical transcription system. Any errors in dictation are purely unintentional.

## 2017-09-30 ENCOUNTER — Telehealth: Payer: Self-pay | Admitting: *Deleted

## 2017-09-30 NOTE — Telephone Encounter (Signed)
PA required for EMLA cream. PA submitted to [facility].

## 2017-10-05 ENCOUNTER — Inpatient Hospital Stay: Payer: Medicare HMO | Attending: Oncology

## 2017-10-05 ENCOUNTER — Other Ambulatory Visit: Payer: Self-pay

## 2017-10-05 ENCOUNTER — Encounter: Payer: Self-pay | Admitting: Oncology

## 2017-10-05 ENCOUNTER — Inpatient Hospital Stay: Payer: Medicare HMO

## 2017-10-05 ENCOUNTER — Inpatient Hospital Stay (HOSPITAL_BASED_OUTPATIENT_CLINIC_OR_DEPARTMENT_OTHER): Payer: Medicare HMO | Admitting: Oncology

## 2017-10-05 VITALS — BP 132/65 | HR 78 | Temp 96.5°F | Resp 18 | Wt 123.8 lb

## 2017-10-05 DIAGNOSIS — C782 Secondary malignant neoplasm of pleura: Secondary | ICD-10-CM | POA: Insufficient documentation

## 2017-10-05 DIAGNOSIS — I701 Atherosclerosis of renal artery: Secondary | ICD-10-CM | POA: Insufficient documentation

## 2017-10-05 DIAGNOSIS — Z90710 Acquired absence of both cervix and uterus: Secondary | ICD-10-CM | POA: Diagnosis not present

## 2017-10-05 DIAGNOSIS — C3431 Malignant neoplasm of lower lobe, right bronchus or lung: Secondary | ICD-10-CM

## 2017-10-05 DIAGNOSIS — I10 Essential (primary) hypertension: Secondary | ICD-10-CM | POA: Insufficient documentation

## 2017-10-05 DIAGNOSIS — E876 Hypokalemia: Secondary | ICD-10-CM | POA: Insufficient documentation

## 2017-10-05 DIAGNOSIS — K5900 Constipation, unspecified: Secondary | ICD-10-CM | POA: Diagnosis not present

## 2017-10-05 DIAGNOSIS — Z7982 Long term (current) use of aspirin: Secondary | ICD-10-CM | POA: Diagnosis not present

## 2017-10-05 DIAGNOSIS — M129 Arthropathy, unspecified: Secondary | ICD-10-CM

## 2017-10-05 DIAGNOSIS — Z87442 Personal history of urinary calculi: Secondary | ICD-10-CM | POA: Insufficient documentation

## 2017-10-05 DIAGNOSIS — Z90722 Acquired absence of ovaries, bilateral: Secondary | ICD-10-CM | POA: Diagnosis not present

## 2017-10-05 DIAGNOSIS — M545 Low back pain: Secondary | ICD-10-CM | POA: Insufficient documentation

## 2017-10-05 DIAGNOSIS — C3490 Malignant neoplasm of unspecified part of unspecified bronchus or lung: Secondary | ICD-10-CM

## 2017-10-05 DIAGNOSIS — E871 Hypo-osmolality and hyponatremia: Secondary | ICD-10-CM | POA: Insufficient documentation

## 2017-10-05 DIAGNOSIS — Z5111 Encounter for antineoplastic chemotherapy: Secondary | ICD-10-CM

## 2017-10-05 DIAGNOSIS — E785 Hyperlipidemia, unspecified: Secondary | ICD-10-CM

## 2017-10-05 DIAGNOSIS — Z800 Family history of malignant neoplasm of digestive organs: Secondary | ICD-10-CM | POA: Insufficient documentation

## 2017-10-05 DIAGNOSIS — D72829 Elevated white blood cell count, unspecified: Secondary | ICD-10-CM | POA: Insufficient documentation

## 2017-10-05 DIAGNOSIS — Z86010 Personal history of colonic polyps: Secondary | ICD-10-CM | POA: Diagnosis not present

## 2017-10-05 DIAGNOSIS — R0789 Other chest pain: Secondary | ICD-10-CM

## 2017-10-05 DIAGNOSIS — Z79899 Other long term (current) drug therapy: Secondary | ICD-10-CM | POA: Diagnosis not present

## 2017-10-05 LAB — TSH: TSH: 0.096 u[IU]/mL — ABNORMAL LOW (ref 0.350–4.500)

## 2017-10-05 LAB — COMPREHENSIVE METABOLIC PANEL
ALBUMIN: 3.9 g/dL (ref 3.5–5.0)
ALK PHOS: 119 U/L (ref 38–126)
ALT: 21 U/L (ref 0–44)
AST: 29 U/L (ref 15–41)
Anion gap: 8 (ref 5–15)
BILIRUBIN TOTAL: 0.5 mg/dL (ref 0.3–1.2)
BUN: 29 mg/dL — AB (ref 8–23)
CO2: 27 mmol/L (ref 22–32)
CREATININE: 0.73 mg/dL (ref 0.44–1.00)
Calcium: 9.4 mg/dL (ref 8.9–10.3)
Chloride: 99 mmol/L (ref 98–111)
GFR calc Af Amer: >60 mL/min (ref >60)
GFR calc non Af Amer: >60 mL/min (ref >60)
GLUCOSE: 138 mg/dL — AB (ref 70–99)
Potassium: 3.7 mmol/L (ref 3.5–5.1)
Sodium: 134 mmol/L — ABNORMAL LOW (ref 135–145)
TOTAL PROTEIN: 6.7 g/dL (ref 6.5–8.1)

## 2017-10-05 LAB — CBC WITH DIFFERENTIAL/PLATELET
BASOS ABS: 0 10*3/uL (ref 0–0.1)
Basophils Relative: 0 %
EOS ABS: 0 10*3/uL (ref 0–0.7)
EOS PCT: 0 %
HCT: 35.1 % (ref 35.0–47.0)
Hemoglobin: 12.2 g/dL (ref 12.0–16.0)
LYMPHS PCT: 6 %
Lymphs Abs: 1.2 10*3/uL (ref 1.0–3.6)
MCH: 30.5 pg (ref 26.0–34.0)
MCHC: 34.7 g/dL (ref 32.0–36.0)
MCV: 87.9 fL (ref 80.0–100.0)
Monocytes Absolute: 1.2 10*3/uL — ABNORMAL HIGH (ref 0.2–0.9)
Monocytes Relative: 7 %
Neutro Abs: 16.1 10*3/uL — ABNORMAL HIGH (ref 1.4–6.5)
Neutrophils Relative %: 87 %
PLATELETS: 290 10*3/uL (ref 150–440)
RBC: 4 MIL/uL (ref 3.80–5.20)
RDW: 12.9 % (ref 11.5–14.5)
WBC: 18.5 10*3/uL — ABNORMAL HIGH (ref 3.6–11.0)

## 2017-10-05 MED ORDER — SODIUM CHLORIDE 0.9% FLUSH
10.0000 mL | Freq: Once | INTRAVENOUS | Status: AC
  Administered 2017-10-05: 10 mL via INTRAVENOUS
  Filled 2017-10-05: qty 10

## 2017-10-05 MED ORDER — HEPARIN SOD (PORK) LOCK FLUSH 100 UNIT/ML IV SOLN
500.0000 [IU] | Freq: Once | INTRAVENOUS | Status: AC
  Administered 2017-10-05: 500 [IU] via INTRAVENOUS
  Filled 2017-10-05: qty 5

## 2017-10-05 MED ORDER — FOLIC ACID 1 MG PO TABS: 1.0000 mg | ORAL_TABLET | Freq: Every day | ORAL | 3 refills | Status: DC

## 2017-10-05 NOTE — Progress Notes (Signed)
Hematology/Oncology Consult note [facility] [phone] [phone]   Patient Care Team: McLean-Scocuzza, Nino Glow, MD as PCP - General (Internal Medicine) Wellington Hampshire, MD as Consulting Physician (Cardiology) Telford Nab, RN as Registered Nurse  REFERRING PROVIDER: [place] VISIT Follow up for treatment of stage IV lung adenocarcinoma.   HISTORY OF PRESENTING ILLNESS:  Tamara Burton is a  84 y.o.  female with PMH listed below who was referred to me for evaluation of lung cancer.  Patient reports a remote history of lung cancer in 2003 and status post left lower lobectomy.  We obtained medical  records from [facility] in [place].  Reviewed pathology results.  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  Perihilar lymph node negative.  Peri-bronchial lymph node negative. Patient is accompanied by her friend who is her house health power of attorney Patient has noticed mild chronic left side chest wall pain which she described as a stitch, for about 6 months to a year.  Pain is very mild and she takes Tylenol or tramadol as needed.  Pain got little worse early this year. 07/15/2017 CT chest with contrast showed new left pleural process since prior CT from 2016.  Findings most suspicious for pleural tumor with complex pleural fluid.  Medial left upper lobe atelectasis without obvious tumor. 08/16/2017 PET scan showed multifocal left side pleural hypermetabolic is him and a soft tissue thickening, consistent with pleural metastasis Hyper metabolic soft tissue density within the lingula, suspicious for primary bronchogenic carcinoma.   There is also left adrenal hypermetabolic them is nonspecific and without CT correlate.  Otherwise no evidence of extra thoracic metastatic disease.  Right  nephrolithiasis.  # 08/26/2017 CT biopsy of left lower chest pleura showed positive for adenocarcinoma.   Patient's case was discussed on tumor board on Sep 01, 2017.  Per pathology, there is no enough tissue for foundation one testing or Omniseq.  Patient was referred by pulmonology Dr. Shawna Orleans to me to discuss about management plan.  INTERVAL HISTORY Tamara Burton is a 84 y.o. female who has above history reviewed by me today presents for follow up visit for management of stage IV lung adenocarcinoma.  Minimal left chest wall pain, stable. Not better or worse. Shortness of breath,very mild   Good energy level and exercise tolerance. Remains very active.  Denies weight loss, cough, hemoptysis,  Review of Systems  Constitutional: Negative for chills, fever, malaise/fatigue and weight loss.  HENT: Negative for congestion, ear discharge, ear pain, nosebleeds, sinus pain and sore throat.   Eyes: Negative for double vision, photophobia, pain, discharge and redness.  Respiratory: Negative for cough, hemoptysis, sputum production, shortness of breath and wheezing.   Cardiovascular: Negative for chest pain, palpitations, orthopnea, claudication and leg swelling.  Gastrointestinal: Negative for abdominal pain, blood in stool, constipation, diarrhea, heartburn, melena, nausea and vomiting.  Genitourinary: Negative for dysuria, flank pain, frequency and hematuria.  Musculoskeletal: Negative for back pain, myalgias and neck pain.       Intermittent mild left lower anterior chest wall pain.  Skin: Negative for itching and rash.  Neurological: Negative for dizziness, tingling, tremors, focal weakness, weakness and headaches.  Endo/Heme/Allergies: Negative for environmental allergies. Does not bruise/bleed easily.  Psychiatric/Behavioral: Negative for depression and hallucinations. The patient is not nervous/anxious.     MEDICAL HISTORY:  Past Medical History:  Diagnosis Date  . Arthritis     "  qwhere"  . Carotid artery disease ([place])   . Chronic lower back pain   . Colon polyps    benign per pt   . Epistaxis    with use of nasal sprays   . Headache    "lately; related to HTN" (04/24/2014)  . Hyperlipidemia   . Hypertension   . Hyponatremia    h/o  . Kidney stone   . Lung cancer ([place]) 2003   s/p left lower lobe resection  . Non-obstructive CAD    a. 04/2014 Cath: LM nl, LAD/D1/LCX/RCA min irregs, RPDA/PAV nl.  . Orthostatic hypotension   . OSA on CPAP    "wear it intermittently"  . PONV (postoperative nausea and vomiting)    "when I was younger"  . Renal artery stenosis ([place])    a. 04/2014 Renal angiography: LRA 95p, RRA 40ost. PTA of LRA deferred 2/2 guide cath induced Ao dissection;  01/27 6 x 15 mm Herculink stent to the Left-RA  follows with [facility] Dr. Ronalee Belts  . Sinus headache     SURGICAL HISTORY: Past Surgical History:  Procedure Laterality Date  . APPENDECTOMY    . BILATERAL OOPHORECTOMY Bilateral 2000's  . CARDIAC CATHETERIZATION  04/24/2014  . CATARACT EXTRACTION W/ INTRAOCULAR LENS  IMPLANT, BILATERAL Bilateral   . JOINT REPLACEMENT    . LEFT HEART CATHETERIZATION WITH CORONARY ANGIOGRAM N/A 04/24/2014   Procedure: LEFT HEART CATHETERIZATION WITH CORONARY ANGIOGRAM;  Surgeon: Wellington Hampshire, MD;  Location: [facility] CATH [facility];  Service: Cardiovascular;  Laterality: N/A;  . LUNG LOBECTOMY Left 2003   "lower lobe"  . PERIPHERAL VASCULAR CATHETERIZATION  05/01/2014   Procedure: RENAL INTERVENTION;  Surgeon: Wellington Hampshire, MD; 6 x 15 mm Herculink stent to the Left Renal Artery  . PORTA CATH INSERTION N/A 09/26/2017   Procedure: PORTA CATH INSERTION;  Surgeon: Algernon Huxley, MD;  Location: [facility];  Service: Cardiovascular;  Laterality: N/A;  . RENAL ANGIOGRAM  04/24/2014  . RENAL ANGIOGRAM N/A 04/24/2014   Procedure: RENAL ANGIOGRAM;  Surgeon: Wellington Hampshire, MD;  Location: [facility] CATH [facility];  Service: Cardiovascular;  Laterality: N/A;  . RENAL ANGIOGRAM Left  05/01/2014   Procedure: RENAL ANGIOGRAM;  Surgeon: Wellington Hampshire, MD;  Location: [facility];  Service: Cardiovascular;  Laterality: Left;  . REPLACEMENT TOTAL KNEE Left   . TOTAL HIP ARTHROPLASTY Right   . TUBAL LIGATION  ~ 1975  . VAGINAL DELIVERY  X 2  . VAGINAL HYSTERECTOMY  1980's    SOCIAL HISTORY: Social History   Socioeconomic History  . Marital status: Widowed    Spouse name: Not on file  . Number of children: Not on file  . Years of education: Not on file  . Highest education level: Not on file  Occupational History  . Not on file  Social Needs  . Financial resource strain: Not hard at all  . Food insecurity:    Worry: Never true    Inability: Never true  . Transportation needs:    Medical: No    Non-medical: No  Tobacco Use  . Smoking status: Never Smoker  . Smokeless tobacco: Never Used  . Tobacco comment: Father and husband both smokers  Substance and Sexual Activity  . Alcohol use: Yes    Comment: 04/24/2014 "glass of beer or wine q couple weeks"  . Drug use: No  . Sexual activity: Not on file  Lifestyle  . Physical activity:    Days per week: Not on file  Minutes per session: Not on file  . Stress: Not on file  Relationships  . Social connections:    Talks on phone: Not on file    Gets together: Not on file    Attends religious service: Not on file    Active member of club or organization: Not on file    Attends meetings of clubs or organizations: Not on file    Relationship status: Not on file  . Intimate partner violence:    Fear of current or ex partner: Not on file    Emotionally abused: Not on file    Physically abused: Not on file    Forced sexual activity: Not on file  Other Topics Concern  . Not on file  Social History Narrative   Lives in [place]. Daughter nearby. Used to live in [place]   Normal ADLs as of 02/2017 and no h/o falls    Likes to walk   Lives alone     FAMILY HISTORY: Family History  Problem Relation  Age of Onset  . Parkinsonism Mother   . Alzheimer's disease Mother   . Sarcoidosis Brother   . Stroke Maternal Grandmother   . Stomach cancer Paternal Grandmother   . Diabetes Son   . Diabetes Daughter     ALLERGIES:  is allergic to atorvastatin; penicillins; sulfa antibiotics; and amlodipine.  MEDICATIONS:  Current Outpatient Medications  Medication Sig Dispense Refill  . acetaminophen (TYLENOL) 650 MG CR tablet Take 650 mg by mouth every 8 (eight) hours as needed for pain.    . Ascorbic Acid (VITAMIN C CR) 1500 MG TBCR Take 1,500 mg by mouth daily.     Marland Kitchen aspirin 81 MG tablet Take 81 mg by mouth daily.    . Biotin 2500 MCG CAPS Take 1 capsule by mouth daily.    . carvedilol (COREG) 12.5 MG tablet Take 1 tablet (12.5 mg total) by mouth 2 (two) times daily with a meal. (Patient taking differently: Take 3.125 mg by mouth 2 (two) times daily with a meal. ) 180 tablet 3  . Cholecalciferol (VITAMIN D3) 5000 units CAPS Take 1 capsule by mouth daily.    . Coenzyme Q10 (CO Q-10) 200 MG CAPS Take 1 capsule by mouth 2 (two) times daily.    Marland Kitchen ezetimibe (ZETIA) 10 MG tablet Take 10 mg by mouth daily.    Marland Kitchen glucosamine-chondroitin 500-400 MG tablet Take 1 tablet by mouth 2 (two) times daily.    . hydrALAZINE (APRESOLINE) 25 MG tablet Take 1 tablet (25 mg total) by mouth 3 (three) times daily. (Patient taking differently: Take 25 mg by mouth daily as needed. ) 90 tablet 6  . lidocaine-prilocaine (EMLA) cream Apply to affected area once 30 g 3  . losartan-hydrochlorothiazide (HYZAAR) 100-25 MG tablet Take 1 tablet by mouth daily. 90 tablet 0  . Magnesium 500 MG CAPS Take 1 capsule by mouth daily.    . ondansetron (ZOFRAN) 8 MG tablet Take 1 tablet (8 mg total) by mouth 2 (two) times daily. Take two times a day starting the day after chemo for 3 days. Then take two times a day as needed for nausea or vomiting. 30 tablet 1  . prochlorperazine (COMPAZINE) 10 MG tablet Take 1 tablet (10 mg total) by mouth  every 6 (six) hours as needed (Nausea or vomiting). 30 tablet 1  . Red Yeast Rice Extract (RED YEAST RICE PO) Take 1,200 mg by mouth 2 (two) times daily.    . traMADol (  ULTRAM) 50 MG tablet Take 1 tablet (50 mg total) by mouth daily as needed. For back pain/joint pain (Patient taking differently: Take 50 mg by mouth daily as needed for moderate pain. For back pain/joint pain) 90 tablet 0  . zinc gluconate 50 MG tablet Take 50 mg by mouth daily.    . folic acid (FOLVITE) 1 MG tablet Take 1 tablet (1 mg total) by mouth daily. 30 tablet 3  . Lactobacillus (ACIDOPHILUS PROBIOTIC) 100 MG CAPS Take 100 mg by mouth daily.     No current facility-administered medications for this visit.      PHYSICAL EXAMINATION: ECOG PERFORMANCE STATUS: 0 - Asymptomatic Vitals:   10/05/17 0856  BP: 132/65  Pulse: 78  Resp: 18  Temp: (!) 96.5 F (35.8 C)  SpO2: 94%   Filed Weights   10/05/17 0856  Weight: 123 lb 12.8 oz (56.2 kg)    Physical Exam  Constitutional: She is oriented to person, place, and time. She appears well-developed and well-nourished. No distress.  HENT:  Head: Normocephalic and atraumatic.  Right Ear: External ear normal.  Left Ear: External ear normal.  Mouth/Throat: Oropharynx is clear and moist.  Eyes: Pupils are equal, round, and reactive to light. Conjunctivae and EOM are normal. No scleral icterus.  Neck: Normal range of motion. Neck supple.  Cardiovascular: Normal rate, regular rhythm and normal heart sounds.  Pulmonary/Chest: Effort normal and breath sounds normal. No respiratory distress. She has no wheezes. She has no rales. She exhibits no tenderness.  Abdominal: Soft. Bowel sounds are normal. She exhibits no distension and no mass. There is no tenderness.  Musculoskeletal: Normal range of motion. She exhibits no edema or deformity.  Lymphadenopathy:    She has no cervical adenopathy.  Neurological: She is alert and oriented to person, place, and time. No cranial nerve  deficit. Coordination normal.  Skin: Skin is warm and dry. No rash noted.  Psychiatric: She has a normal mood and affect. Her behavior is normal. Thought content normal.     LABORATORY DATA:  I have reviewed the data as listed Lab Results  Component Value Date   WBC 18.5 (H) 10/05/2017   HGB 12.2 10/05/2017   HCT 35.1 10/05/2017   MCV 87.9 10/05/2017   PLT 290 10/05/2017   Recent Labs    03/11/17 1010  07/15/17 1031 09/08/17 1022 10/05/17 0828  NA 139  --  137  --  134*  K 4.2  --  3.5  --  3.7  CL 100  --  100*  --  99  CO2 31  --  29  --  27  GLUCOSE 99  --  106*  --  138*  BUN 21  --  18  --  29*  CREATININE 0.82   < > 0.66 0.80 0.73  CALCIUM 9.2  --  9.1  --  9.4  GFRNONAA  --   --  >60  --  >60  GFRAA  --   --  >60  --  >60  PROT 6.7  --  6.9  --  6.7  ALBUMIN 4.2  --  4.1  --  3.9  AST 23  --  25  --  29  ALT 14  --  17  --  21  ALKPHOS 100  --  100  --  119  BILITOT 0.7  --  0.9  --  0.5   < > = values in this interval not displayed.  ASSESSMENT & PLAN:  84 year old female who has a history of left lower lobe primary lung adenocarcinoma present for evaluation of newly diagnosed 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 1. 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 Malignant neoplasm of unspecified part of unspecified bronchus or lung ([place])   Cancer Staging Malignant neoplasm of unspecified part of unspecified bronchus or lung ([place]) Staging form: Lung, AJCC 8th Edition - Clinical stage from 09/02/2017: Stage IV (cT2a, cN0, pM1a) - Signed by Earlie Server, MD on 09/02/2017  Discussed with patient that she needs to take folic acid for 5-7 prior to starting chemotherapy with Alimta. Folic acid Rx was not successfully e prescribed and patient did not take it.  Since she is barely symptomatic, will delay her chemotherapy to next week.  Patient voices understanding and agree with the plan.   #All questions were answered. The patient knows to call the  clinic with any problems questions or concerns.  Return of visit:  Next week. Earlie Server, MD, PhD Hematology Oncology [facility] Pager- [phone] 10/05/2017

## 2017-10-05 NOTE — Progress Notes (Signed)
Patient here for follow up. No concerns voiced.  °

## 2017-10-10 ENCOUNTER — Inpatient Hospital Stay: Payer: Medicare HMO

## 2017-10-10 ENCOUNTER — Encounter: Payer: Self-pay | Admitting: Oncology

## 2017-10-10 ENCOUNTER — Telehealth: Payer: Self-pay | Admitting: *Deleted

## 2017-10-10 ENCOUNTER — Ambulatory Visit: Payer: Medicare HMO | Admitting: Internal Medicine

## 2017-10-10 ENCOUNTER — Encounter: Payer: Self-pay | Admitting: *Deleted

## 2017-10-10 ENCOUNTER — Inpatient Hospital Stay (HOSPITAL_BASED_OUTPATIENT_CLINIC_OR_DEPARTMENT_OTHER): Payer: Medicare HMO | Admitting: Oncology

## 2017-10-10 VITALS — BP 152/79 | HR 91 | Temp 96.9°F | Resp 18 | Wt 125.5 lb

## 2017-10-10 DIAGNOSIS — C3431 Malignant neoplasm of lower lobe, right bronchus or lung: Secondary | ICD-10-CM

## 2017-10-10 DIAGNOSIS — Z87442 Personal history of urinary calculi: Secondary | ICD-10-CM

## 2017-10-10 DIAGNOSIS — I10 Essential (primary) hypertension: Secondary | ICD-10-CM

## 2017-10-10 DIAGNOSIS — E871 Hypo-osmolality and hyponatremia: Secondary | ICD-10-CM

## 2017-10-10 DIAGNOSIS — R0789 Other chest pain: Secondary | ICD-10-CM | POA: Diagnosis not present

## 2017-10-10 DIAGNOSIS — Z90710 Acquired absence of both cervix and uterus: Secondary | ICD-10-CM

## 2017-10-10 DIAGNOSIS — C3490 Malignant neoplasm of unspecified part of unspecified bronchus or lung: Secondary | ICD-10-CM

## 2017-10-10 DIAGNOSIS — Z79899 Other long term (current) drug therapy: Secondary | ICD-10-CM

## 2017-10-10 DIAGNOSIS — Z90722 Acquired absence of ovaries, bilateral: Secondary | ICD-10-CM

## 2017-10-10 DIAGNOSIS — C782 Secondary malignant neoplasm of pleura: Secondary | ICD-10-CM | POA: Diagnosis not present

## 2017-10-10 DIAGNOSIS — M129 Arthropathy, unspecified: Secondary | ICD-10-CM | POA: Diagnosis not present

## 2017-10-10 DIAGNOSIS — Z86010 Personal history of colonic polyps: Secondary | ICD-10-CM

## 2017-10-10 DIAGNOSIS — M545 Low back pain: Secondary | ICD-10-CM

## 2017-10-10 DIAGNOSIS — Z7982 Long term (current) use of aspirin: Secondary | ICD-10-CM

## 2017-10-10 DIAGNOSIS — I701 Atherosclerosis of renal artery: Secondary | ICD-10-CM | POA: Diagnosis not present

## 2017-10-10 DIAGNOSIS — Z5111 Encounter for antineoplastic chemotherapy: Secondary | ICD-10-CM

## 2017-10-10 DIAGNOSIS — E785 Hyperlipidemia, unspecified: Secondary | ICD-10-CM | POA: Diagnosis not present

## 2017-10-10 DIAGNOSIS — C3492 Malignant neoplasm of unspecified part of left bronchus or lung: Secondary | ICD-10-CM

## 2017-10-10 DIAGNOSIS — Z800 Family history of malignant neoplasm of digestive organs: Secondary | ICD-10-CM

## 2017-10-10 LAB — COMPREHENSIVE METABOLIC PANEL
ALK PHOS: 131 U/L — AB (ref 38–126)
ALT: 35 U/L (ref 0–44)
AST: 35 U/L (ref 15–41)
Albumin: 3.4 g/dL — ABNORMAL LOW (ref 3.5–5.0)
Anion gap: 9 (ref 5–15)
BILIRUBIN TOTAL: 0.6 mg/dL (ref 0.3–1.2)
BUN: 30 mg/dL — AB (ref 8–23)
CALCIUM: 9.2 mg/dL (ref 8.9–10.3)
CO2: 23 mmol/L (ref 22–32)
CREATININE: 0.84 mg/dL (ref 0.44–1.00)
Chloride: 102 mmol/L (ref 98–111)
Glucose, Bld: 131 mg/dL — ABNORMAL HIGH (ref 70–99)
Potassium: 4 mmol/L (ref 3.5–5.1)
Sodium: 134 mmol/L — ABNORMAL LOW (ref 135–145)
Total Protein: 6.7 g/dL (ref 6.5–8.1)

## 2017-10-10 LAB — CBC WITH DIFFERENTIAL/PLATELET
BASOS PCT: 0 %
Basophils Absolute: 0.1 10*3/uL (ref 0–0.1)
EOS ABS: 0 10*3/uL (ref 0–0.7)
EOS PCT: 0 %
HCT: 34.5 % — ABNORMAL LOW (ref 35.0–47.0)
HEMOGLOBIN: 11.9 g/dL — AB (ref 12.0–16.0)
Lymphocytes Relative: 4 %
Lymphs Abs: 0.9 10*3/uL — ABNORMAL LOW (ref 1.0–3.6)
MCH: 30.3 pg (ref 26.0–34.0)
MCHC: 34.5 g/dL (ref 32.0–36.0)
MCV: 87.9 fL (ref 80.0–100.0)
Monocytes Absolute: 1.7 10*3/uL — ABNORMAL HIGH (ref 0.2–0.9)
Monocytes Relative: 7 %
NEUTROS PCT: 89 %
Neutro Abs: 23.4 10*3/uL — ABNORMAL HIGH (ref 1.4–6.5)
PLATELETS: 288 10*3/uL (ref 150–440)
RBC: 3.93 MIL/uL (ref 3.80–5.20)
RDW: 12.5 % (ref 11.5–14.5)
WBC: 26.2 10*3/uL — AB (ref 3.6–11.0)

## 2017-10-10 LAB — IRON AND TIBC
IRON: 40 ug/dL (ref 28–170)
Saturation Ratios: 16 % (ref 10.4–31.8)
TIBC: 250 ug/dL (ref 250–450)
UIBC: 210 ug/dL

## 2017-10-10 LAB — FERRITIN: FERRITIN: 372 ng/mL — AB (ref 11–307)

## 2017-10-10 MED ORDER — SODIUM CHLORIDE 0.9 % IV SOLN
200.0000 mg | Freq: Once | INTRAVENOUS | Status: AC
  Administered 2017-10-10: 200 mg via INTRAVENOUS
  Filled 2017-10-10: qty 8

## 2017-10-10 MED ORDER — SODIUM CHLORIDE 0.9 % IV SOLN
Freq: Once | INTRAVENOUS | Status: AC
  Administered 2017-10-10: 11:00:00 via INTRAVENOUS
  Filled 2017-10-10: qty 5

## 2017-10-10 MED ORDER — SODIUM CHLORIDE 0.9 % IV SOLN
800.0000 mg | Freq: Once | INTRAVENOUS | Status: AC
  Administered 2017-10-10: 800 mg via INTRAVENOUS
  Filled 2017-10-10: qty 20

## 2017-10-10 MED ORDER — HEPARIN SOD (PORK) LOCK FLUSH 100 UNIT/ML IV SOLN
500.0000 [IU] | Freq: Once | INTRAVENOUS | Status: AC
  Administered 2017-10-10: 500 [IU] via INTRAVENOUS
  Filled 2017-10-10: qty 5

## 2017-10-10 MED ORDER — SODIUM CHLORIDE 0.9% FLUSH
10.0000 mL | INTRAVENOUS | Status: DC | PRN
  Administered 2017-10-10: 10 mL via INTRAVENOUS
  Filled 2017-10-10: qty 10

## 2017-10-10 MED ORDER — SODIUM CHLORIDE 0.9 % IV SOLN
320.0000 mg | Freq: Once | INTRAVENOUS | Status: AC
  Administered 2017-10-10: 320 mg via INTRAVENOUS
  Filled 2017-10-10: qty 32

## 2017-10-10 MED ORDER — CYANOCOBALAMIN 1000 MCG/ML IJ SOLN
1000.0000 ug | Freq: Once | INTRAMUSCULAR | Status: AC
  Administered 2017-10-10: 1000 ug via INTRAMUSCULAR
  Filled 2017-10-10: qty 1

## 2017-10-10 MED ORDER — PALONOSETRON HCL INJECTION 0.25 MG/5ML
0.2500 mg | Freq: Once | INTRAVENOUS | Status: AC
  Administered 2017-10-10: 0.25 mg via INTRAVENOUS
  Filled 2017-10-10: qty 5

## 2017-10-10 MED ORDER — SODIUM CHLORIDE 0.9 % IV SOLN
Freq: Once | INTRAVENOUS | Status: AC
  Administered 2017-10-10: 11:00:00 via INTRAVENOUS
  Filled 2017-10-10: qty 1000

## 2017-10-10 MED ORDER — ONDANSETRON HCL 8 MG PO TABS: 8.0000 mg | ORAL_TABLET | Freq: Two times a day (BID) | ORAL | 1 refills | Status: DC

## 2017-10-10 NOTE — Progress Notes (Signed)
Pt in for follow up and first chemo treatment today.  Pt has been taking folic acid since last Wednesday.

## 2017-10-10 NOTE — Telephone Encounter (Signed)
Pt went to pick up ondansetron at pharmacy and was informed that required PA. PA has been submitted to [facility] via covermymeds. Awaiting response.

## 2017-10-10 NOTE — Progress Notes (Signed)
Hematology/Oncology Consult note [facility] [phone] [phone]   Patient Care Team: McLean-Scocuzza, Nino Glow, MD as PCP - General (Internal Medicine) Wellington Hampshire, MD as Consulting Physician (Cardiology) Telford Nab, RN as Registered Nurse  REFERRING PROVIDER: Dr.Simonds REASON FOR VISIT  follow-up for assessment prior to chemotherapy treatment for stage IV lung adenocarcinoma. HISTORY OF PRESENTING ILLNESS:  Tamara Burton is a  84 y.o.  female with PMH listed below who was referred to me for evaluation of lung cancer.  Patient reports a remote history of lung cancer in 2003 and status post left lower lobectomy.  We obtained medical  records from [facility] in [place].  Reviewed pathology results.  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  Perihilar lymph node negative.  Peri-bronchial lymph node negative. Patient is accompanied by her friend who is her house health power of attorney Patient has noticed mild chronic left side chest wall pain which she described as a stitch, for about 6 months to a year.  Pain is very mild and she takes Tylenol or tramadol as needed.  Pain got little worse early this year. 07/15/2017 CT chest with contrast showed new left pleural process since prior CT from 2016.  Findings most suspicious for pleural tumor with complex pleural fluid.  Medial left upper lobe atelectasis without obvious tumor. 08/16/2017 PET scan showed multifocal left side pleural hypermetabolic is him and a soft tissue thickening, consistent with pleural metastasis Hyper metabolic soft tissue density within the lingula, suspicious for primary bronchogenic carcinoma.   There is also left adrenal hypermetabolic them is nonspecific and without CT correlate.  Otherwise no evidence of extra thoracic metastatic  disease.  Right nephrolithiasis.  # 08/26/2017 CT biopsy of left lower chest pleura showed positive for adenocarcinoma.   Patient's case was discussed on tumor board on Sep 01, 2017.  Per pathology, there is no enough tissue for foundation one testing or Omniseq.  Patient was referred by pulmonology Dr. Shawna Orleans to me to discuss about management plan.  INTERVAL HISTORY Tamara Burton is a 84 y.o. female who has above history reviewed by me present for assessment prior to stage IV lung adenocarcinoma. Patient reports feeling at baseline. She has minimal left chest wall pain, stable.  Not better or worse. Shortness of breath very very mild. She has good energy level and exercise tolerance.  Lives by herself.  Remains very active.  Denies any weight loss, Cough, hemoptysis. Over the past weekend, she felt very tired for about 1 day, symptoms resolved spontaneously.  Denies fever or chills.  Review of Systems  Constitutional: Negative for chills, fever, malaise/fatigue and weight loss.  HENT: Negative for congestion, ear discharge, ear pain, nosebleeds, sinus pain and sore throat.   Eyes: Negative for double vision, photophobia, pain, discharge and redness.  Respiratory: Negative for cough, hemoptysis, sputum production, shortness of breath and wheezing.   Cardiovascular: Negative for chest pain, palpitations, orthopnea, claudication and leg swelling.  Gastrointestinal: Negative for abdominal pain, blood in stool, constipation, diarrhea, heartburn, melena, nausea and vomiting.  Genitourinary: Negative for dysuria, flank pain, frequency and hematuria.  Musculoskeletal: Negative for back pain, myalgias and neck pain.       Intermittent mild left lower anterior chest wall pain.  Skin: Negative for itching and rash.  Neurological: Negative for dizziness, tingling, tremors, focal weakness, weakness and headaches.  Endo/Heme/Allergies: Negative for environmental allergies. Does  not bruise/bleed  easily.  Psychiatric/Behavioral: Negative for depression and hallucinations. The patient is not nervous/anxious.     MEDICAL HISTORY:  Past Medical History:  Diagnosis Date  . Arthritis    "qwhere"  . Carotid artery disease ([place])   . Chronic lower back pain   . Colon polyps    benign per pt   . Epistaxis    with use of nasal sprays   . Headache    "lately; related to HTN" (04/24/2014)  . Hyperlipidemia   . Hypertension   . Hyponatremia    h/o  . Kidney stone   . Lung cancer ([place]) 2003   s/p left lower lobe resection  . Non-obstructive CAD    a. 04/2014 Cath: LM nl, LAD/D1/LCX/RCA min irregs, RPDA/PAV nl.  . Orthostatic hypotension   . OSA on CPAP    "wear it intermittently"  . PONV (postoperative nausea and vomiting)    "when I was younger"  . Renal artery stenosis ([place])    a. 04/2014 Renal angiography: LRA 95p, RRA 40ost. PTA of LRA deferred 2/2 guide cath induced Ao dissection;  01/27 6 x 15 mm Herculink stent to the Left-RA  follows with [facility] Dr. Ronalee Belts  . Sinus headache     SURGICAL HISTORY: Past Surgical History:  Procedure Laterality Date  . APPENDECTOMY    . BILATERAL OOPHORECTOMY Bilateral 2000's  . CARDIAC CATHETERIZATION  04/24/2014  . CATARACT EXTRACTION W/ INTRAOCULAR LENS  IMPLANT, BILATERAL Bilateral   . JOINT REPLACEMENT    . LEFT HEART CATHETERIZATION WITH CORONARY ANGIOGRAM N/A 04/24/2014   Procedure: LEFT HEART CATHETERIZATION WITH CORONARY ANGIOGRAM;  Surgeon: Wellington Hampshire, MD;  Location: [facility] CATH [facility];  Service: Cardiovascular;  Laterality: N/A;  . LUNG LOBECTOMY Left 2003   "lower lobe"  . PERIPHERAL VASCULAR CATHETERIZATION  05/01/2014   Procedure: RENAL INTERVENTION;  Surgeon: Wellington Hampshire, MD; 6 x 15 mm Herculink stent to the Left Renal Artery  . PORTA CATH INSERTION N/A 09/26/2017   Procedure: PORTA CATH INSERTION;  Surgeon: Algernon Huxley, MD;  Location: [facility] CV LAB;  Service: Cardiovascular;  Laterality: N/A;  . RENAL ANGIOGRAM   04/24/2014  . RENAL ANGIOGRAM N/A 04/24/2014   Procedure: RENAL ANGIOGRAM;  Surgeon: Wellington Hampshire, MD;  Location: [facility] CATH LAB;  Service: Cardiovascular;  Laterality: N/A;  . RENAL ANGIOGRAM Left 05/01/2014   Procedure: RENAL ANGIOGRAM;  Surgeon: Wellington Hampshire, MD;  Location: [facility] CATH LAB;  Service: Cardiovascular;  Laterality: Left;  . REPLACEMENT TOTAL KNEE Left   . TOTAL HIP ARTHROPLASTY Right   . TUBAL LIGATION  ~ 1975  . VAGINAL DELIVERY  X 2  . VAGINAL HYSTERECTOMY  1980's    SOCIAL HISTORY: Social History   Socioeconomic History  . Marital status: Widowed    Spouse name: Not on file  . Number of children: Not on file  . Years of education: Not on file  . Highest education level: Not on file  Occupational History  . Not on file  Social Needs  . Financial resource strain: Not hard at all  . Food insecurity:    Worry: Never true    Inability: Never true  . Transportation needs:    Medical: No    Non-medical: No  Tobacco Use  . Smoking status: Never Smoker  . Smokeless tobacco: Never Used  . Tobacco comment: Father and husband both smokers  Substance and Sexual Activity  . Alcohol use: Yes    Comment: 04/24/2014 "  glass of beer or wine q couple weeks"  . Drug use: No  . Sexual activity: Not on file  Lifestyle  . Physical activity:    Days per week: Not on file    Minutes per session: Not on file  . Stress: Not on file  Relationships  . Social connections:    Talks on phone: Not on file    Gets together: Not on file    Attends religious service: Not on file    Active member of club or organization: Not on file    Attends meetings of clubs or organizations: Not on file    Relationship status: Not on file  . Intimate partner violence:    Fear of current or ex partner: Not on file    Emotionally abused: Not on file    Physically abused: Not on file    Forced sexual activity: Not on file  Other Topics Concern  . Not on file  Social History Narrative   Lives  in [place]. Daughter nearby. Used to live in [place]   Normal ADLs as of 02/2017 and no h/o falls    Likes to walk   Lives alone     FAMILY HISTORY: Family History  Problem Relation Age of Onset  . Parkinsonism Mother   . Alzheimer's disease Mother   . Sarcoidosis Brother   . Stroke Maternal Grandmother   . Stomach cancer Paternal Grandmother   . Diabetes Son   . Diabetes Daughter     ALLERGIES:  is allergic to atorvastatin; penicillins; sulfa antibiotics; and amlodipine.  MEDICATIONS:  Current Outpatient Medications  Medication Sig Dispense Refill  . acetaminophen (TYLENOL) 650 MG CR tablet Take 650 mg by mouth every 8 (eight) hours as needed for pain.    . Ascorbic Acid (VITAMIN C CR) 1500 MG TBCR Take 1,500 mg by mouth daily.     Marland Kitchen aspirin 81 MG tablet Take 81 mg by mouth daily.    . Biotin 2500 MCG CAPS Take 1 capsule by mouth daily.    . carvedilol (COREG) 12.5 MG tablet Take 1 tablet (12.5 mg total) by mouth 2 (two) times daily with a meal. (Patient taking differently: Take 3.125 mg by mouth 2 (two) times daily with a meal. ) 180 tablet 3  . Cholecalciferol (VITAMIN D3) 5000 units CAPS Take 1 capsule by mouth daily.    . Coenzyme Q10 (CO Q-10) 200 MG CAPS Take 1 capsule by mouth 2 (two) times daily.    Marland Kitchen ezetimibe (ZETIA) 10 MG tablet Take 10 mg by mouth daily.    . folic acid (FOLVITE) 1 MG tablet Take 1 tablet (1 mg total) by mouth daily. 30 tablet 3  . glucosamine-chondroitin 500-400 MG tablet Take 1 tablet by mouth 2 (two) times daily.    . hydrALAZINE (APRESOLINE) 25 MG tablet Take 1 tablet (25 mg total) by mouth 3 (three) times daily. (Patient taking differently: Take 25 mg by mouth daily as needed. ) 90 tablet 6  . Lactobacillus (ACIDOPHILUS PROBIOTIC) 100 MG CAPS Take 100 mg by mouth daily.    Marland Kitchen lidocaine-prilocaine (EMLA) cream Apply to affected area once 30 g 3  . losartan-hydrochlorothiazide (HYZAAR) 100-25 MG tablet Take 1 tablet by mouth daily. 90  tablet 0  . Magnesium 500 MG CAPS Take 1 capsule by mouth daily.    . ondansetron (ZOFRAN) 8 MG tablet Take 1 tablet (8 mg total) by mouth 2 (two) times daily. Take two times a day  starting the day after chemo for 3 days. Then take two times a day PRN for nausea or vomiting. 30 tablet 1  . prochlorperazine (COMPAZINE) 10 MG tablet Take 1 tablet (10 mg total) by mouth every 6 (six) hours as needed (Nausea or vomiting). (Patient not taking: Reported on 10/10/2017) 30 tablet 1  . Red Yeast Rice Extract (RED YEAST RICE PO) Take 1,200 mg by mouth 2 (two) times daily.    . traMADol (ULTRAM) 50 MG tablet Take 1 tablet (50 mg total) by mouth daily as needed. For back pain/joint pain (Patient taking differently: Take 50 mg by mouth daily as needed for moderate pain. For back pain/joint pain) 90 tablet 0  . zinc gluconate 50 MG tablet Take 50 mg by mouth daily.     No current facility-administered medications for this visit.    Facility-Administered Medications Ordered in Other Visits  Medication Dose Route Frequency Provider Last Rate Last Dose  . sodium chloride flush (NS) 0.9 % injection 10 mL  10 mL Intravenous PRN Earlie Server, MD   10 mL at 10/10/17 0859     PHYSICAL EXAMINATION: ECOG PERFORMANCE STATUS: 0 - Asymptomatic Vitals:   10/10/17 0936  BP: (!) 152/79  Pulse: 91  Resp: 18  Temp: (!) 96.9 F (36.1 C)   Filed Weights   10/10/17 0936  Weight: 125 lb 8 oz (56.9 kg)    Physical Exam  Constitutional: She is oriented to person, place, and time. She appears well-developed and well-nourished. No distress.  HENT:  Head: Normocephalic and atraumatic.  Right Ear: External ear normal.  Left Ear: External ear normal.  Mouth/Throat: Oropharynx is clear and moist.  Eyes: Pupils are equal, round, and reactive to light. Conjunctivae and EOM are normal. No scleral icterus.  Neck: Normal range of motion. Neck supple.  Cardiovascular: Normal rate, regular rhythm and normal heart sounds.    Pulmonary/Chest: Effort normal. No respiratory distress. She has no wheezes. She has no rales. She exhibits no tenderness.  Decreased breath sounds on the left lower lung base  Abdominal: Soft. Bowel sounds are normal. She exhibits no distension and no mass. There is no tenderness.  Musculoskeletal: Normal range of motion. She exhibits no edema or deformity.  Lymphadenopathy:    She has no cervical adenopathy.  Neurological: She is alert and oriented to person, place, and time. No cranial nerve deficit. Coordination normal.  Skin: Skin is warm and dry. No rash noted.  Psychiatric: She has a normal mood and affect. Her behavior is normal. Thought content normal.     LABORATORY DATA:  I have reviewed the data as listed Lab Results  Component Value Date   WBC 26.2 (H) 10/10/2017   HGB 11.9 (L) 10/10/2017   HCT 34.5 (L) 10/10/2017   MCV 87.9 10/10/2017   PLT 288 10/10/2017   Recent Labs    07/15/17 1031 09/08/17 1022 10/05/17 0828 10/10/17 0859  NA 137  --  134* 134*  K 3.5  --  3.7 4.0  CL 100*  --  99 102  CO2 29  --  27 23  GLUCOSE 106*  --  138* 131*  BUN 18  --  29* 30*  CREATININE 0.66 0.80 0.73 0.84  CALCIUM 9.1  --  9.4 9.2  GFRNONAA >60  --  >60 >60  GFRAA >60  --  >60 >60  PROT 6.9  --  6.7 6.7  ALBUMIN 4.1  --  3.9 3.4*  AST 25  --  29 35  ALT 17  --  21 35  ALKPHOS 100  --  119 131*  BILITOT 0.9  --  0.5 0.6       ASSESSMENT & PLAN:  84 year old female who has a history of left lower lobe primary lung adenocarcinoma present for evaluation of newly diagnosed 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 1. Malignant neoplasm of unspecified part of unspecified bronchus or lung ([place])   2. Pleural metastasis ([place])   Cancer Staging Malignant neoplasm of unspecified part of unspecified bronchus or lung ([place]) Staging form: Lung, AJCC 8th Edition - Clinical stage from 09/02/2017: Stage IV (cT2a, cN0, pM1a) - Signed by Earlie Server, MD on  09/02/2017  #Labs are reviewed.  Acceptable for treatment today. She has been on folic acid for about a week.  Advised patient to continue folic acid daily. She will receive 1 dose of B12 parenteral injection 1000 MCG, will continue every 9 weeks while she is on Al[place]ta.  #  mild hypokalemia, advised patient to increase potassium rich food. #Leukocytosis without any clinical signs of infection, likely due to dexamethasone prior to the treatment.  Continue monitor. Repeat CBC, CMP in 1 week, assessment for tolerability of chemotherapy.  #All questions were answered. The patient knows to call the clinic with any problems questions or concerns. Total face to face encounter time for this patient visit was 25 min. >50% of the time was  spent in counseling and coordination of care.  Return of visit:  Next week. Earlie Server, MD, PhD Hematology Oncology [facility] Pager- [phone] 10/10/2017

## 2017-10-10 NOTE — Progress Notes (Signed)
  Oncology Nurse Navigator Documentation  Navigator Location: [facility]Med Onc (10/10/17 1000)   )Navigator Encounter Type: Treatment (10/10/17 1000)                   Treatment Initiated Date: 10/10/17 (10/10/17 1000) Patient Visit Type: MedOnc (10/10/17 1000) Treatment Phase: First Chemo Tx (10/10/17 1000) Barriers/Navigation Needs: No barriers at this time (10/10/17 1000)   Interventions: None required (10/10/17 1000)                      Time Spent with Patient: 30 (10/10/17 1000)

## 2017-10-11 ENCOUNTER — Telehealth: Payer: Self-pay | Admitting: Internal Medicine

## 2017-10-11 ENCOUNTER — Telehealth: Payer: Self-pay

## 2017-10-11 ENCOUNTER — Other Ambulatory Visit: Payer: Self-pay | Admitting: Internal Medicine

## 2017-10-11 DIAGNOSIS — E785 Hyperlipidemia, unspecified: Secondary | ICD-10-CM

## 2017-10-11 MED ORDER — EZETIMIBE 10 MG PO TABS: 10.0000 mg | ORAL_TABLET | Freq: Every day | ORAL | 3 refills | Status: DC

## 2017-10-11 NOTE — Telephone Encounter (Signed)
Refill of Zetia by historical provider  LOV 05/05/17 J. Noah Charon

## 2017-10-11 NOTE — Telephone Encounter (Signed)
Copied from [place] [phone]. Topic: Appointment Scheduling - Scheduling Inquiry for Clinic >> Oct 11, 2017  8:28 AM Boyd Kerbs wrote: Reason for CRM:  pt said appt was canceled for a F/U visit, as she was going to Dr. Tasia Catchings she is going to her the next 3 weeks to follow pt.  and was not sure if needs to see Dr. Aundra Dubin

## 2017-10-11 NOTE — Telephone Encounter (Signed)
Ok to refill 

## 2017-10-11 NOTE — Telephone Encounter (Unsigned)
Copied from [place] [phone]. Topic: Quick Communication - Rx Refill/Question >> Oct 03, 2017 10:23 AM Percell Belt A wrote: Medication: ezetimibe (ZETIA) 10 MG tablet [[id number]]- 90 day supply   Has the patient contacted their pharmacy? {no  (Agent: If no, request that the patient contact the pharmacy for the refill.) (Agent: If yes, when and what did the pharmacy advise?)  Preferred Pharmacy (with phone number or street name): [place], [place]: Please be advised that RX refills may take up to 3 business days. We ask that you follow-up with your pharmacy.

## 2017-10-11 NOTE — Telephone Encounter (Signed)
I am ok If she follows with cancer doctors for now  At least see me w/in the next 4 months   [place]

## 2017-10-11 NOTE — Telephone Encounter (Signed)
Please advise. See attached message

## 2017-10-18 ENCOUNTER — Encounter: Payer: Self-pay | Admitting: Oncology

## 2017-10-18 ENCOUNTER — Inpatient Hospital Stay: Payer: Medicare HMO

## 2017-10-18 ENCOUNTER — Other Ambulatory Visit: Payer: Self-pay

## 2017-10-18 ENCOUNTER — Inpatient Hospital Stay (HOSPITAL_BASED_OUTPATIENT_CLINIC_OR_DEPARTMENT_OTHER): Payer: Medicare HMO | Admitting: Oncology

## 2017-10-18 VITALS — BP 114/72 | HR 80 | Temp 98.3°F | Resp 18 | Wt 124.2 lb

## 2017-10-18 DIAGNOSIS — C3431 Malignant neoplasm of lower lobe, right bronchus or lung: Secondary | ICD-10-CM | POA: Diagnosis not present

## 2017-10-18 DIAGNOSIS — R0789 Other chest pain: Secondary | ICD-10-CM | POA: Diagnosis not present

## 2017-10-18 DIAGNOSIS — Z5111 Encounter for antineoplastic chemotherapy: Secondary | ICD-10-CM

## 2017-10-18 DIAGNOSIS — Z87442 Personal history of urinary calculi: Secondary | ICD-10-CM

## 2017-10-18 DIAGNOSIS — D72829 Elevated white blood cell count, unspecified: Secondary | ICD-10-CM

## 2017-10-18 DIAGNOSIS — I10 Essential (primary) hypertension: Secondary | ICD-10-CM | POA: Diagnosis not present

## 2017-10-18 DIAGNOSIS — Z79899 Other long term (current) drug therapy: Secondary | ICD-10-CM

## 2017-10-18 DIAGNOSIS — Z7982 Long term (current) use of aspirin: Secondary | ICD-10-CM

## 2017-10-18 DIAGNOSIS — M545 Low back pain: Secondary | ICD-10-CM

## 2017-10-18 DIAGNOSIS — K5900 Constipation, unspecified: Secondary | ICD-10-CM | POA: Diagnosis not present

## 2017-10-18 DIAGNOSIS — Z86010 Personal history of colonic polyps: Secondary | ICD-10-CM

## 2017-10-18 DIAGNOSIS — Z800 Family history of malignant neoplasm of digestive organs: Secondary | ICD-10-CM

## 2017-10-18 DIAGNOSIS — Z90710 Acquired absence of both cervix and uterus: Secondary | ICD-10-CM

## 2017-10-18 DIAGNOSIS — I701 Atherosclerosis of renal artery: Secondary | ICD-10-CM

## 2017-10-18 DIAGNOSIS — Z90722 Acquired absence of ovaries, bilateral: Secondary | ICD-10-CM

## 2017-10-18 DIAGNOSIS — E876 Hypokalemia: Secondary | ICD-10-CM

## 2017-10-18 DIAGNOSIS — E871 Hypo-osmolality and hyponatremia: Secondary | ICD-10-CM | POA: Diagnosis not present

## 2017-10-18 DIAGNOSIS — C782 Secondary malignant neoplasm of pleura: Secondary | ICD-10-CM | POA: Diagnosis not present

## 2017-10-18 DIAGNOSIS — M129 Arthropathy, unspecified: Secondary | ICD-10-CM | POA: Diagnosis not present

## 2017-10-18 DIAGNOSIS — E785 Hyperlipidemia, unspecified: Secondary | ICD-10-CM

## 2017-10-18 DIAGNOSIS — C3490 Malignant neoplasm of unspecified part of unspecified bronchus or lung: Secondary | ICD-10-CM

## 2017-10-18 LAB — CBC WITH DIFFERENTIAL/PLATELET
Basophils Absolute: 0 10*3/uL (ref 0–0.1)
Basophils Relative: 0 %
Eosinophils Absolute: 0.2 10*3/uL (ref 0–0.7)
Eosinophils Relative: 3 %
HEMATOCRIT: 35.1 % (ref 35.0–47.0)
Hemoglobin: 12.2 g/dL (ref 12.0–16.0)
LYMPHS PCT: 17 %
Lymphs Abs: 1 10*3/uL (ref 1.0–3.6)
MCH: 30.6 pg (ref 26.0–34.0)
MCHC: 34.6 g/dL (ref 32.0–36.0)
MCV: 88.4 fL (ref 80.0–100.0)
MONO ABS: 0.5 10*3/uL (ref 0.2–0.9)
MONOS PCT: 9 %
NEUTROS ABS: 4.1 10*3/uL (ref 1.4–6.5)
Neutrophils Relative %: 71 %
Platelets: 205 10*3/uL (ref 150–440)
RBC: 3.97 MIL/uL (ref 3.80–5.20)
RDW: 12.4 % (ref 11.5–14.5)
WBC: 5.8 10*3/uL (ref 3.6–11.0)

## 2017-10-18 LAB — COMPREHENSIVE METABOLIC PANEL
ALBUMIN: 3.1 g/dL — AB (ref 3.5–5.0)
ALK PHOS: 108 U/L (ref 38–126)
ALT: 24 U/L (ref 0–44)
ANION GAP: 9 (ref 5–15)
AST: 22 U/L (ref 15–41)
BUN: 14 mg/dL (ref 8–23)
CALCIUM: 8.5 mg/dL — AB (ref 8.9–10.3)
CO2: 29 mmol/L (ref 22–32)
Chloride: 96 mmol/L — ABNORMAL LOW (ref 98–111)
Creatinine, Ser: 0.59 mg/dL (ref 0.44–1.00)
GFR calc Af Amer: >60 mL/min (ref >60)
GFR calc non Af Amer: >60 mL/min (ref >60)
GLUCOSE: 101 mg/dL — AB (ref 70–99)
POTASSIUM: 4.1 mmol/L (ref 3.5–5.1)
SODIUM: 134 mmol/L — AB (ref 135–145)
Total Bilirubin: 0.5 mg/dL (ref 0.3–1.2)
Total Protein: 5.8 g/dL — ABNORMAL LOW (ref 6.5–8.1)

## 2017-10-18 MED ORDER — DOCUSATE SODIUM 100 MG PO CAPS: 100.0000 mg | ORAL_CAPSULE | Freq: Every day | ORAL | 2 refills | Status: DC | PRN

## 2017-10-18 NOTE — Progress Notes (Signed)
Patient here for follow up. States that she has been having low blood pressure making her feel weak and disoriented. She did not take bp medication last night or this morning and bp is 114/72, pt asymptomatic.

## 2017-10-18 NOTE — Progress Notes (Signed)
Hematology/Oncology follow up note [facility] Telephone:([phone]) [phone] Fax:(336) [phone]   Patient Care Team: McLean-Scocuzza, Nino Glow, MD as PCP - General (Internal Medicine) Wellington Hampshire, MD as Consulting Physician (Cardiology) Telford Nab, RN as Registered Nurse  REFERRING PROVIDER: [place] VISIT  follow-up for assessment after chemotherapy treatment for stage IV lung adenocarcinoma. HISTORY OF PRESENTING ILLNESS:  Tamara Burton is a  84 y.o.  female with PMH listed below who was referred to me for evaluation of lung cancer.  Patient reports a remote history of lung cancer in 2003 and status post left lower lobectomy.  We obtained medical  records from [facility] in [place].  Reviewed pathology results.  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  Perihilar lymph node negative.  Peri-bronchial lymph node negative. Patient is accompanied by her friend who is her house health power of attorney Patient has noticed mild chronic left side chest wall pain which she described as a stitch, for about 6 months to a year.  Pain is very mild and she takes Tylenol or tramadol as needed.  Pain got little worse early this year. 07/15/2017 CT chest with contrast showed new left pleural process since prior CT from 2016.  Findings most suspicious for pleural tumor with complex pleural fluid.  Medial left upper lobe atelectasis without obvious tumor. 08/16/2017 PET scan showed multifocal left side pleural hypermetabolic is him and a soft tissue thickening, consistent with pleural metastasis Hyper metabolic soft tissue density within the lingula, suspicious for primary bronchogenic carcinoma.   There is also left adrenal hypermetabolic them is nonspecific and without CT correlate.  Otherwise no evidence of extra thoracic metastatic  disease.  Right nephrolithiasis.  # 08/26/2017 CT biopsy of left lower chest pleura showed positive for adenocarcinoma.   Patient's case was discussed on tumor board on Sep 01, 2017.  Per pathology, there is no enough tissue for foundation one testing or Omniseq.  Patient was referred by pulmonology Dr. Shawna Orleans to me to discuss about management plan.  INTERVAL HISTORY Tamara Burton is a 84 y.o. female who has above history reviewed by me presents for assessment after chemotherapy.  She reports feeling very tired after chemotherapy. She reports " eats well" however, do not feel hungry. Constipation, new problem for patient. Started after chemotherapy. No aggravating or alleviating factors.  Low blood pressure: she brought daily BP records. For the past 2 days her systolic BP has been lowe, around or mildly lower than 100. HR ranges from 50-80s. Takes Coreg 12.5mg  BID, losartan hydrochlorothiazide 100/25mg  daily. She held BP medication since yesterday and feels better today. No associated with any other symptoms. Denies chest pain, SOB. Denies fever or chills.  Reports she feels "hyper" due to the steroids use. Requests to have lower steroid use during next cycle treatment.   Review of Systems  Constitutional: Positive for malaise/fatigue. Negative for chills, fever and weight loss.  HENT: Negative for congestion, ear discharge, ear pain, nosebleeds, sinus pain and sore throat.   Eyes: Negative for double vision, photophobia, pain, discharge and redness.  Respiratory: Negative for cough, hemoptysis, sputum production, shortness of breath and wheezing.   Cardiovascular: Negative for chest pain, palpitations, orthopnea, claudication and leg swelling.  Gastrointestinal: Positive for constipation. Negative for abdominal pain, blood in stool, diarrhea, heartburn, melena, nausea and vomiting.  Genitourinary: Negative for dysuria, flank pain, frequency and hematuria.  Musculoskeletal:  Negative for  back pain, myalgias and neck pain.       Intermittent mild left lower anterior chest wall pain.  Skin: Negative for itching and rash.  Neurological: Negative for dizziness, tingling, tremors, focal weakness, weakness and headaches.  Endo/Heme/Allergies: Negative for environmental allergies. Does not bruise/bleed easily.  Psychiatric/Behavioral: Negative for depression and hallucinations. The patient is not nervous/anxious.     MEDICAL HISTORY:  Past Medical History:  Diagnosis Date  . Arthritis    "qwhere"  . Carotid artery disease ([place])   . Chronic lower back pain   . Colon polyps    benign per pt   . Epistaxis    with use of nasal sprays   . Headache    "lately; related to HTN" (04/24/2014)  . Hyperlipidemia   . Hypertension   . Hyponatremia    h/o  . Kidney stone   . Lung cancer ([place]) 2003   s/p left lower lobe resection  . Non-obstructive CAD    a. 04/2014 Cath: LM nl, LAD/D1/LCX/RCA min irregs, RPDA/PAV nl.  . Orthostatic hypotension   . OSA on CPAP    "wear it intermittently"  . PONV (postoperative nausea and vomiting)    "when I was younger"  . Renal artery stenosis ([place])    a. 04/2014 Renal angiography: LRA 95p, RRA 40ost. PTA of LRA deferred 2/2 guide cath induced Ao dissection;  01/27 6 x 15 mm Herculink stent to the Left-RA  follows with [facility] Dr. Ronalee Belts  . Sinus headache     SURGICAL HISTORY: Past Surgical History:  Procedure Laterality Date  . APPENDECTOMY    . BILATERAL OOPHORECTOMY Bilateral 2000's  . CARDIAC CATHETERIZATION  04/24/2014  . CATARACT EXTRACTION W/ INTRAOCULAR LENS  IMPLANT, BILATERAL Bilateral   . JOINT REPLACEMENT    . LEFT HEART CATHETERIZATION WITH CORONARY ANGIOGRAM N/A 04/24/2014   Procedure: LEFT HEART CATHETERIZATION WITH CORONARY ANGIOGRAM;  Surgeon: Wellington Hampshire, MD;  Location: [facility];  Service: Cardiovascular;  Laterality: N/A;  . LUNG LOBECTOMY Left 2003   "lower lobe"  . PERIPHERAL VASCULAR CATHETERIZATION  05/01/2014    Procedure: RENAL INTERVENTION;  Surgeon: Wellington Hampshire, MD; 6 x 15 mm Herculink stent to the Left Renal Artery  . PORTA CATH INSERTION N/A 09/26/2017   Procedure: PORTA CATH INSERTION;  Surgeon: Algernon Huxley, MD;  Location: [facility];  Service: Cardiovascular;  Laterality: N/A;  . RENAL ANGIOGRAM  04/24/2014  . RENAL ANGIOGRAM N/A 04/24/2014   Procedure: RENAL ANGIOGRAM;  Surgeon: Wellington Hampshire, MD;  Location: [facility];  Service: Cardiovascular;  Laterality: N/A;  . RENAL ANGIOGRAM Left 05/01/2014   Procedure: RENAL ANGIOGRAM;  Surgeon: Wellington Hampshire, MD;  Location: [facility];  Service: Cardiovascular;  Laterality: Left;  . REPLACEMENT TOTAL KNEE Left   . TOTAL HIP ARTHROPLASTY Right   . TUBAL LIGATION  ~ 1975  . VAGINAL DELIVERY  X 2  . VAGINAL HYSTERECTOMY  1980's    SOCIAL HISTORY: Social History   Socioeconomic History  . Marital status: Widowed    Spouse name: Not on file  . Number of children: Not on file  . Years of education: Not on file  . Highest education level: Not on file  Occupational History  . Not on file  Social Needs  . Financial resource strain: Not hard at all  . Food insecurity:    Worry: Never true    Inability: Never true  . Transportation needs:  Medical: No    Non-medical: No  Tobacco Use  . Smoking status: Never Smoker  . Smokeless tobacco: Never Used  . Tobacco comment: Father and husband both smokers  Substance and Sexual Activity  . Alcohol use: Yes    Comment: 04/24/2014 "glass of beer or wine q couple weeks"  . Drug use: No  . Sexual activity: Not on file  Lifestyle  . Physical activity:    Days per week: Not on file    Minutes per session: Not on file  . Stress: Not on file  Relationships  . Social connections:    Talks on phone: Not on file    Gets together: Not on file    Attends religious service: Not on file    Active member of club or organization: Not on file    Attends meetings of clubs or  organizations: Not on file    Relationship status: Not on file  . Intimate partner violence:    Fear of current or ex partner: Not on file    Emotionally abused: Not on file    Physically abused: Not on file    Forced sexual activity: Not on file  Other Topics Concern  . Not on file  Social History Narrative   Lives in [place]. Daughter nearby. Used to live in [place]   Normal ADLs as of 02/2017 and no h/o falls    Likes to walk   Lives alone     FAMILY HISTORY: Family History  Problem Relation Age of Onset  . Parkinsonism Mother   . Alzheimer's disease Mother   . Sarcoidosis Brother   . Stroke Maternal Grandmother   . Stomach cancer Paternal Grandmother   . Diabetes Son   . Diabetes Daughter     ALLERGIES:  is allergic to atorvastatin; penicillins; sulfa antibiotics; and amlodipine.  MEDICATIONS:  Current Outpatient Medications  Medication Sig Dispense Refill  . acetaminophen (TYLENOL) 650 MG CR tablet Take 650 mg by mouth every 8 (eight) hours as needed for pain.    . Ascorbic Acid (VITAMIN C CR) 1500 MG TBCR Take 1,500 mg by mouth daily.     Marland Kitchen aspirin 81 MG tablet Take 81 mg by mouth daily.    . Biotin 2500 MCG CAPS Take 1 capsule by mouth daily.    . carvedilol (COREG) 12.5 MG tablet Take 1 tablet (12.5 mg total) by mouth 2 (two) times daily with a meal. (Patient taking differently: Take 3.125 mg by mouth 2 (two) times daily with a meal. ) 180 tablet 3  . Cholecalciferol (VITAMIN D3) 5000 units CAPS Take 1 capsule by mouth daily.    . Coenzyme Q10 (CO Q-10) 200 MG CAPS Take 1 capsule by mouth 2 (two) times daily.    Marland Kitchen dexamethasone (DECADRON) 4 MG tablet Take 4 mg by mouth.    . ezetimibe (ZETIA) 10 MG tablet Take 1 tablet (10 mg total) by mouth daily. 90 tablet 3  . folic acid (FOLVITE) 1 MG tablet Take 1 tablet (1 mg total) by mouth daily. 30 tablet 3  . glucosamine-chondroitin 500-400 MG tablet Take 1 tablet by mouth 2 (two) times daily.    . hydrALAZINE  (APRESOLINE) 25 MG tablet Take 1 tablet (25 mg total) by mouth 3 (three) times daily. (Patient taking differently: Take 25 mg by mouth daily as needed. ) 90 tablet 6  . Lactobacillus (ACIDOPHILUS PROBIOTIC) 100 MG CAPS Take 100 mg by mouth daily.    Marland Kitchen lidocaine-prilocaine (  EMLA) cream Apply to affected area once 30 g 3  . losartan-hydrochlorothiazide (HYZAAR) 100-25 MG tablet Take 1 tablet by mouth daily. 90 tablet 0  . Magnesium 500 MG CAPS Take 1 capsule by mouth daily.    . ondansetron (ZOFRAN) 8 MG tablet Take 1 tablet (8 mg total) by mouth 2 (two) times daily. Take two times a day starting the day after chemo for 3 days. Then take two times a day PRN for nausea or vomiting. 30 tablet 1  . prochlorperazine (COMPAZINE) 10 MG tablet Take 1 tablet (10 mg total) by mouth every 6 (six) hours as needed (Nausea or vomiting). 30 tablet 1  . Red Yeast Rice Extract (RED YEAST RICE PO) Take 1,200 mg by mouth 2 (two) times daily.    . traMADol (ULTRAM) 50 MG tablet Take 1 tablet (50 mg total) by mouth daily as needed. For back pain/joint pain (Patient taking differently: Take 50 mg by mouth daily as needed for moderate pain. For back pain/joint pain) 90 tablet 0  . zinc gluconate 50 MG tablet Take 50 mg by mouth daily.    Marland Kitchen docusate sodium (COLACE) 100 MG capsule Take 1 capsule (100 mg total) by mouth daily as needed for mild constipation or moderate constipation. Do not take if you have loose stools 30 capsule 2   No current facility-administered medications for this visit.      PHYSICAL EXAMINATION: ECOG PERFORMANCE STATUS: 0 - Asymptomatic Vitals:   10/18/17 0840  BP: 114/72  Pulse: 80  Resp: 18  Temp: 98.3 F (36.8 C)  SpO2: 98%   Filed Weights   10/18/17 0840  Weight: 124 lb 3.2 oz (56.3 kg)    Physical Exam  Constitutional: She is oriented to person, place, and time. She appears well-developed and well-nourished. No distress.  HENT:  Head: Normocephalic and atraumatic.  Right Ear:  External ear normal.  Left Ear: External ear normal.  Mouth/Throat: Oropharynx is clear and moist.  Eyes: Pupils are equal, round, and reactive to light. Conjunctivae and EOM are normal. No scleral icterus.  Neck: Normal range of motion. Neck supple.  Cardiovascular: Normal rate, regular rhythm and normal heart sounds.  Pulmonary/Chest: Effort normal and breath sounds normal. No respiratory distress. She has no wheezes. She has no rales. She exhibits no tenderness.  Decreased breath sounds on the left lower lung base  Abdominal: Soft. Bowel sounds are normal. She exhibits no distension and no mass. There is no tenderness.  Musculoskeletal: Normal range of motion. She exhibits no edema or deformity.  Lymphadenopathy:    She has no cervical adenopathy.  Neurological: She is alert and oriented to person, place, and time. No cranial nerve deficit. Coordination normal.  Skin: Skin is warm and dry. No rash noted.  Psychiatric: She has a normal mood and affect. Her behavior is normal. Thought content normal.     LABORATORY DATA:  I have reviewed the data as listed Lab Results  Component Value Date   WBC 5.8 10/18/2017   HGB 12.2 10/18/2017   HCT 35.1 10/18/2017   MCV 88.4 10/18/2017   PLT 205 10/18/2017   Recent Labs    10/05/17 0828 10/10/17 0859 10/18/17 0822  NA 134* 134* 134*  K 3.7 4.0 4.1  CL 99 102 96*  CO2 27 23 29   GLUCOSE 138* 131* 101*  BUN 29* 30* 14  CREATININE 0.73 0.84 0.59  CALCIUM 9.4 9.2 8.5*  GFRNONAA >60 >60 >60  GFRAA >60 >60 >60  PROT 6.7 6.7 5.8*  ALBUMIN 3.9 3.4* 3.1*  AST 29 35 22  ALT 21 35 24  ALKPHOS 119 131* 108  BILITOT 0.5 0.6 0.5       ASSESSMENT & PLAN:  84 year old female who has a history of left lower lobe primary lung adenocarcinoma present for evaluation of newly diagnosed 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 1. Malignant neoplasm of unspecified part of unspecified bronchus or lung ([place])   2. 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  # Constipation: recommend patient to start Colace 100mg  daily.  # mild hyponatremia, likely due to decreased PO intake. Continue monitor.  # Patient has questions about herbal medication and asks if ok to use. She brought a list of herbal medication which she wants to start.  She would like me to check one of the herbal medication called Graviola. I discussed with patient that in general I do not encourage patient to start herbal medication especially during chemotherapy.  Discussed with patient that there is lack of human data for Graviola "anti cancer" effect. Also Graviola may affect her blood pressure.  Patient voices understanding.   #All questions were answered. The patient knows to call the clinic with any problems questions or concerns. Total face to face encounter time for this patient visit was 25 min. >50% of the time was  spent in counseling and coordination of care.   Return of visit: 2 weeks for cycle 2 chemotherapy.  Earlie Server, MD, PhD Hematology Oncology [facility] Pager- [phone] 10/18/2017

## 2017-10-27 ENCOUNTER — Encounter: Payer: Self-pay | Admitting: Oncology

## 2017-10-27 ENCOUNTER — Other Ambulatory Visit: Payer: Self-pay | Admitting: Oncology

## 2017-10-27 MED ORDER — ALPRAZOLAM 0.25 MG PO TABS: 0.2500 mg | ORAL_TABLET | Freq: Two times a day (BID) | ORAL | 0 refills | Status: DC | PRN

## 2017-10-27 NOTE — Progress Notes (Signed)
Patient called and reports feeling anxious. Requests medication to help relieve symptoms.  Xanax 0.25mg  BID PRN Rx sent to pharmacy. Advise patient to avoid driving after taking Xanax.

## 2017-10-31 ENCOUNTER — Inpatient Hospital Stay: Payer: Medicare HMO

## 2017-10-31 ENCOUNTER — Other Ambulatory Visit: Payer: Self-pay

## 2017-10-31 ENCOUNTER — Inpatient Hospital Stay (HOSPITAL_BASED_OUTPATIENT_CLINIC_OR_DEPARTMENT_OTHER): Payer: Medicare HMO | Admitting: Oncology

## 2017-10-31 ENCOUNTER — Encounter: Payer: Self-pay | Admitting: Oncology

## 2017-10-31 VITALS — BP 118/74 | HR 73 | Temp 97.9°F | Wt 121.1 lb

## 2017-10-31 DIAGNOSIS — D72829 Elevated white blood cell count, unspecified: Secondary | ICD-10-CM | POA: Diagnosis not present

## 2017-10-31 DIAGNOSIS — C3431 Malignant neoplasm of lower lobe, right bronchus or lung: Secondary | ICD-10-CM | POA: Diagnosis not present

## 2017-10-31 DIAGNOSIS — M129 Arthropathy, unspecified: Secondary | ICD-10-CM

## 2017-10-31 DIAGNOSIS — E871 Hypo-osmolality and hyponatremia: Secondary | ICD-10-CM

## 2017-10-31 DIAGNOSIS — E785 Hyperlipidemia, unspecified: Secondary | ICD-10-CM

## 2017-10-31 DIAGNOSIS — Z5111 Encounter for antineoplastic chemotherapy: Secondary | ICD-10-CM

## 2017-10-31 DIAGNOSIS — C782 Secondary malignant neoplasm of pleura: Secondary | ICD-10-CM

## 2017-10-31 DIAGNOSIS — Z7982 Long term (current) use of aspirin: Secondary | ICD-10-CM

## 2017-10-31 DIAGNOSIS — R0789 Other chest pain: Secondary | ICD-10-CM

## 2017-10-31 DIAGNOSIS — C3492 Malignant neoplasm of unspecified part of left bronchus or lung: Secondary | ICD-10-CM

## 2017-10-31 DIAGNOSIS — K5900 Constipation, unspecified: Secondary | ICD-10-CM

## 2017-10-31 DIAGNOSIS — Z87442 Personal history of urinary calculi: Secondary | ICD-10-CM

## 2017-10-31 DIAGNOSIS — Z86010 Personal history of colonic polyps: Secondary | ICD-10-CM

## 2017-10-31 DIAGNOSIS — M545 Low back pain: Secondary | ICD-10-CM

## 2017-10-31 DIAGNOSIS — C3490 Malignant neoplasm of unspecified part of unspecified bronchus or lung: Secondary | ICD-10-CM

## 2017-10-31 DIAGNOSIS — Z800 Family history of malignant neoplasm of digestive organs: Secondary | ICD-10-CM

## 2017-10-31 DIAGNOSIS — Z90710 Acquired absence of both cervix and uterus: Secondary | ICD-10-CM

## 2017-10-31 DIAGNOSIS — Z90722 Acquired absence of ovaries, bilateral: Secondary | ICD-10-CM

## 2017-10-31 DIAGNOSIS — E876 Hypokalemia: Secondary | ICD-10-CM | POA: Diagnosis not present

## 2017-10-31 DIAGNOSIS — Z79899 Other long term (current) drug therapy: Secondary | ICD-10-CM

## 2017-10-31 DIAGNOSIS — I10 Essential (primary) hypertension: Secondary | ICD-10-CM

## 2017-10-31 DIAGNOSIS — I701 Atherosclerosis of renal artery: Secondary | ICD-10-CM

## 2017-10-31 LAB — COMPREHENSIVE METABOLIC PANEL
ALT: 17 U/L (ref 0–44)
AST: 22 U/L (ref 15–41)
Albumin: 3.5 g/dL (ref 3.5–5.0)
Alkaline Phosphatase: 114 U/L (ref 38–126)
Anion gap: 11 (ref 5–15)
BUN: 24 mg/dL — ABNORMAL HIGH (ref 8–23)
CO2: 25 mmol/L (ref 22–32)
Calcium: 9.3 mg/dL (ref 8.9–10.3)
Chloride: 100 mmol/L (ref 98–111)
Creatinine, Ser: 0.71 mg/dL (ref 0.44–1.00)
GFR calc Af Amer: >60 mL/min (ref >60)
GFR calc non Af Amer: >60 mL/min (ref >60)
Glucose, Bld: 133 mg/dL — ABNORMAL HIGH (ref 70–99)
POTASSIUM: 3.6 mmol/L (ref 3.5–5.1)
Sodium: 136 mmol/L (ref 135–145)
Total Bilirubin: 0.2 mg/dL — ABNORMAL LOW (ref 0.3–1.2)
Total Protein: 6.5 g/dL (ref 6.5–8.1)

## 2017-10-31 LAB — CBC WITH DIFFERENTIAL/PLATELET
Basophils Absolute: 0 10*3/uL (ref 0–0.1)
Basophils Relative: 0 %
Eosinophils Absolute: 0 10*3/uL (ref 0–0.7)
Eosinophils Relative: 0 %
HCT: 33.4 % — ABNORMAL LOW (ref 35.0–47.0)
Hemoglobin: 11.5 g/dL — ABNORMAL LOW (ref 12.0–16.0)
LYMPHS PCT: 8 %
Lymphs Abs: 0.9 10*3/uL — ABNORMAL LOW (ref 1.0–3.6)
MCH: 29.9 pg (ref 26.0–34.0)
MCHC: 34.4 g/dL (ref 32.0–36.0)
MCV: 86.9 fL (ref 80.0–100.0)
MONOS PCT: 12 %
Monocytes Absolute: 1.3 10*3/uL — ABNORMAL HIGH (ref 0.2–0.9)
Neutro Abs: 8.4 10*3/uL — ABNORMAL HIGH (ref 1.4–6.5)
Neutrophils Relative %: 80 %
Platelets: 399 10*3/uL (ref 150–440)
RBC: 3.84 MIL/uL (ref 3.80–5.20)
RDW: 12.6 % (ref 11.5–14.5)
WBC: 10.6 10*3/uL (ref 3.6–11.0)

## 2017-10-31 LAB — TSH: TSH: 0.209 u[IU]/mL — AB (ref 0.350–4.500)

## 2017-10-31 MED ORDER — SODIUM CHLORIDE 0.9 % IV SOLN
320.0000 mg | Freq: Once | INTRAVENOUS | Status: AC
  Administered 2017-10-31: 320 mg via INTRAVENOUS
  Filled 2017-10-31: qty 32

## 2017-10-31 MED ORDER — PALONOSETRON HCL INJECTION 0.25 MG/5ML
0.2500 mg | Freq: Once | INTRAVENOUS | Status: AC
  Administered 2017-10-31: 0.25 mg via INTRAVENOUS
  Filled 2017-10-31: qty 5

## 2017-10-31 MED ORDER — SODIUM CHLORIDE 0.9 % IV SOLN
Freq: Once | INTRAVENOUS | Status: AC
  Administered 2017-10-31: 10:00:00 via INTRAVENOUS
  Filled 2017-10-31: qty 5

## 2017-10-31 MED ORDER — HEPARIN SOD (PORK) LOCK FLUSH 100 UNIT/ML IV SOLN
500.0000 [IU] | Freq: Once | INTRAVENOUS | Status: AC | PRN
  Administered 2017-10-31: 500 [IU]
  Filled 2017-10-31: qty 5

## 2017-10-31 MED ORDER — SODIUM CHLORIDE 0.9% FLUSH
10.0000 mL | INTRAVENOUS | Status: DC | PRN
  Filled 2017-10-31: qty 10

## 2017-10-31 MED ORDER — SODIUM CHLORIDE 0.9 % IV SOLN
Freq: Once | INTRAVENOUS | Status: AC
  Administered 2017-10-31: 10:00:00 via INTRAVENOUS
  Filled 2017-10-31: qty 1000

## 2017-10-31 MED ORDER — SODIUM CHLORIDE 0.9 % IV SOLN
800.0000 mg | Freq: Once | INTRAVENOUS | Status: AC
  Administered 2017-10-31: 800 mg via INTRAVENOUS
  Filled 2017-10-31: qty 20

## 2017-10-31 MED ORDER — SODIUM CHLORIDE 0.9 % IV SOLN
200.0000 mg | Freq: Once | INTRAVENOUS | Status: AC
  Administered 2017-10-31: 200 mg via INTRAVENOUS
  Filled 2017-10-31: qty 8

## 2017-10-31 NOTE — Progress Notes (Signed)
Patient here today for follow up.   

## 2017-10-31 NOTE — Progress Notes (Signed)
Hematology/Oncology follow up note [facility] Telephone:([phone]) [phone] Fax:(336) [phone]   Patient Care Team: McLean-Scocuzza, Nino Glow, MD as PCP - General (Internal Medicine) Wellington Hampshire, MD as Consulting Physician (Cardiology) Telford Nab, RN as Registered Nurse  REFERRING PROVIDER: [place] VISIT  follow-up for assessment after chemotherapy treatment for stage IV lung adenocarcinoma. HISTORY OF PRESENTING ILLNESS:  Tamara Burton is a  84 y.o.  female with PMH listed below who was referred to me for evaluation of lung cancer.  Patient reports a remote history of lung cancer in 2003 and status post left lower lobectomy.  We obtained medical  records from [facility] in [place].  Reviewed pathology results.  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  Perihilar lymph node negative.  Peri-bronchial lymph node negative. Patient is accompanied by her friend who is her house health power of attorney Patient has noticed mild chronic left side chest wall pain which she described as a stitch, for about 6 months to a year.  Pain is very mild and she takes Tylenol or tramadol as needed.  Pain got little worse early this year. 07/15/2017 CT chest with contrast showed new left pleural process since prior CT from 2016.  Findings most suspicious for pleural tumor with complex pleural fluid.  Medial left upper lobe atelectasis without obvious tumor. 08/16/2017 PET scan showed multifocal left side pleural hypermetabolic is him and a soft tissue thickening, consistent with pleural metastasis Hyper metabolic soft tissue density within the lingula, suspicious for primary bronchogenic carcinoma.   There is also left adrenal hypermetabolic them is nonspecific and without CT correlate.  Otherwise no evidence of extra thoracic metastatic  disease.  Right nephrolithiasis.  # 08/26/2017 CT biopsy of left lower chest pleura showed positive for adenocarcinoma.   Patient's case was discussed on tumor board on Sep 01, 2017.  Per pathology, there is no enough tissue for foundation one testing or Omniseq.  Patient was referred by pulmonology Dr. Shawna Orleans to me to discuss about management plan.  INTERVAL HISTORY Tamara Burton is a 84 y.o. female who has above history  reviewed by me who presents for assessment prior to cycle 2 chemotherapy and immunotherapy. Patient was seen by me 1 week after cycle 1 treatment.  At that time she had constipation which improved after starting stool softener.  # blood pressure fluctuation: At last visit, patient's blood pressure was low, she held her blood pressure medication.  Patient reports that later her blood pressure we really high.  Also feels anxious and nervous. Patient called and I gave her a prescription of Xanax 0.25mg  2 times daily as needed for anxiety.  Patient reports that she has tried that and has helped both her blood pressure and anxiety.  She told me that this morning her blood pressure was normal so she did not take her losartan and took her Coreg instead. Other than that, she tolerates chemotherapy well.  Denies any nausea vomiting.  Feels mild fatigue for a few days after the last chemotherapy.  Review of Systems  Constitutional: Positive for malaise/fatigue. Negative for chills, fever and weight loss.  HENT: Negative for congestion, ear discharge, ear pain, nosebleeds, sinus pain and sore throat.   Eyes: Negative for double vision, photophobia, pain, discharge and redness.  Respiratory: Negative for cough, hemoptysis, sputum production, shortness of breath and wheezing.   Cardiovascular: Negative for chest pain,  palpitations, orthopnea, claudication and leg swelling.  Gastrointestinal: Positive for constipation. Negative for abdominal pain, blood in stool, diarrhea, heartburn,  melena, nausea and vomiting.  Genitourinary: Negative for dysuria, flank pain, frequency and hematuria.  Musculoskeletal: Negative for back pain, myalgias and neck pain.       Intermittent mild left lower anterior chest wall pain.  Skin: Negative for itching and rash.  Neurological: Negative for dizziness, tingling, tremors, focal weakness, weakness and headaches.  Endo/Heme/Allergies: Negative for environmental allergies. Does not bruise/bleed easily.  Psychiatric/Behavioral: Negative for depression and hallucinations. The patient is not nervous/anxious.     MEDICAL HISTORY:  Past Medical History:  Diagnosis Date  . Arthritis    "qwhere"  . Carotid artery disease ([place])   . Chronic lower back pain   . Colon polyps    benign per pt   . Epistaxis    with use of nasal sprays   . Headache    "lately; related to HTN" (04/24/2014)  . Hyperlipidemia   . Hypertension   . Hyponatremia    h/o  . Kidney stone   . Lung cancer ([place]) 2003   s/p left lower lobe resection  . Non-obstructive CAD    a. 04/2014 Cath: LM nl, LAD/D1/LCX/RCA min irregs, RPDA/PAV nl.  . Orthostatic hypotension   . OSA on CPAP    "wear it intermittently"  . PONV (postoperative nausea and vomiting)    "when I was younger"  . Renal artery stenosis ([place])    a. 04/2014 Renal angiography: LRA 95p, RRA 40ost. PTA of LRA deferred 2/2 guide cath induced Ao dissection;  01/27 6 x 15 mm Herculink stent to the Left-RA  follows with [facility] Dr. Ronalee Belts  . Sinus headache     SURGICAL HISTORY: Past Surgical History:  Procedure Laterality Date  . APPENDECTOMY    . BILATERAL OOPHORECTOMY Bilateral 2000's  . CARDIAC CATHETERIZATION  04/24/2014  . CATARACT EXTRACTION W/ INTRAOCULAR LENS  IMPLANT, BILATERAL Bilateral   . JOINT REPLACEMENT    . LEFT HEART CATHETERIZATION WITH CORONARY ANGIOGRAM N/A 04/24/2014   Procedure: LEFT HEART CATHETERIZATION WITH CORONARY ANGIOGRAM;  Surgeon: Wellington Hampshire, MD;  Location: [place] CATH LAB;   Service: Cardiovascular;  Laterality: N/A;  . LUNG LOBECTOMY Left 2003   "lower lobe"  . PERIPHERAL VASCULAR CATHETERIZATION  05/01/2014   Procedure: RENAL INTERVENTION;  Surgeon: Wellington Hampshire, MD; 6 x 15 mm Herculink stent to the Left Renal Artery  . PORTA CATH INSERTION N/A 09/26/2017   Procedure: PORTA CATH INSERTION;  Surgeon: Algernon Huxley, MD;  Location: [place] CV LAB;  Service: Cardiovascular;  Laterality: N/A;  . RENAL ANGIOGRAM  04/24/2014  . RENAL ANGIOGRAM N/A 04/24/2014   Procedure: RENAL ANGIOGRAM;  Surgeon: Wellington Hampshire, MD;  Location: [place] CATH LAB;  Service: Cardiovascular;  Laterality: N/A;  . RENAL ANGIOGRAM Left 05/01/2014   Procedure: RENAL ANGIOGRAM;  Surgeon: Wellington Hampshire, MD;  Location: [place] CATH LAB;  Service: Cardiovascular;  Laterality: Left;  . REPLACEMENT TOTAL KNEE Left   . TOTAL HIP ARTHROPLASTY Right   . TUBAL LIGATION  ~ 1975  . VAGINAL DELIVERY  X 2  . VAGINAL HYSTERECTOMY  1980's    SOCIAL HISTORY: Social History   Socioeconomic History  . Marital status: Widowed    Spouse name: Not on file  . Number of children: Not on file  . Years of education: Not on file  . Highest education level: Not on file  Occupational History  .  Not on file  Social Needs  . Financial resource strain: Not hard at all  . Food insecurity:    Worry: Never true    Inability: Never true  . Transportation needs:    Medical: No    Non-medical: No  Tobacco Use  . Smoking status: Never Smoker  . Smokeless tobacco: Never Used  . Tobacco comment: Father and husband both smokers  Substance and Sexual Activity  . Alcohol use: Yes    Comment: 04/24/2014 "glass of beer or wine q couple weeks"  . Drug use: No  . Sexual activity: Not on file  Lifestyle  . Physical activity:    Days per week: Not on file    Minutes per session: Not on file  . Stress: Not on file  Relationships  . Social connections:    Talks on phone: Not on file    Gets together: Not on file     Attends religious service: Not on file    Active member of club or organization: Not on file    Attends meetings of clubs or organizations: Not on file    Relationship status: Not on file  . Intimate partner violence:    Fear of current or ex partner: Not on file    Emotionally abused: Not on file    Physically abused: Not on file    Forced sexual activity: Not on file  Other Topics Concern  . Not on file  Social History Narrative   Lives in [place]. Daughter nearby. Used to live in [place]   Normal ADLs as of 02/2017 and no h/o falls    Likes to walk   Lives alone     FAMILY HISTORY: Family History  Problem Relation Age of Onset  . Parkinsonism Mother   . Alzheimer's disease Mother   . Sarcoidosis Brother   . Stroke Maternal Grandmother   . Stomach cancer Paternal Grandmother   . Diabetes Son   . Diabetes Daughter     ALLERGIES:  is allergic to atorvastatin; penicillins; sulfa antibiotics; and amlodipine.  MEDICATIONS:  Current Outpatient Medications  Medication Sig Dispense Refill  . acetaminophen (TYLENOL) 650 MG CR tablet Take 650 mg by mouth every 8 (eight) hours as needed for pain.    Marland Kitchen ALPRAZolam (XANAX) 0.25 MG tablet Take 1 tablet (0.25 mg total) by mouth 2 (two) times daily as needed for anxiety. 30 tablet 0  . Ascorbic Acid (VITAMIN C CR) 1500 MG TBCR Take 1,500 mg by mouth daily.     Marland Kitchen aspirin 81 MG tablet Take 81 mg by mouth daily.    . Biotin 2500 MCG CAPS Take 1 capsule by mouth daily.    . carvedilol (COREG) 12.5 MG tablet Take 1 tablet (12.5 mg total) by mouth 2 (two) times daily with a meal. (Patient taking differently: Take 3.125 mg by mouth 2 (two) times daily with a meal. ) 180 tablet 3  . Cholecalciferol (VITAMIN D3) 5000 units CAPS Take 1 capsule by mouth daily.    . Coenzyme Q10 (CO Q-10) 200 MG CAPS Take 1 capsule by mouth 2 (two) times daily.    Marland Kitchen dexamethasone (DECADRON) 4 MG tablet Take 4 mg by mouth.    . docusate sodium (COLACE) 100  MG capsule Take 1 capsule (100 mg total) by mouth daily as needed for mild constipation or moderate constipation. Do not take if you have loose stools 30 capsule 2  . ezetimibe (ZETIA) 10 MG tablet Take 1  tablet (10 mg total) by mouth daily. 90 tablet 3  . folic acid (FOLVITE) 1 MG tablet Take 1 tablet (1 mg total) by mouth daily. 30 tablet 3  . glucosamine-chondroitin 500-400 MG tablet Take 1 tablet by mouth 2 (two) times daily.    . hydrALAZINE (APRESOLINE) 25 MG tablet Take 1 tablet (25 mg total) by mouth 3 (three) times daily. (Patient taking differently: Take 25 mg by mouth daily as needed. ) 90 tablet 6  . Lactobacillus (ACIDOPHILUS PROBIOTIC) 100 MG CAPS Take 100 mg by mouth daily.    Marland Kitchen lidocaine-prilocaine (EMLA) cream Apply to affected area once 30 g 3  . losartan-hydrochlorothiazide (HYZAAR) 100-25 MG tablet Take 1 tablet by mouth daily. 90 tablet 0  . Magnesium 500 MG CAPS Take 1 capsule by mouth daily.    . ondansetron (ZOFRAN) 8 MG tablet Take 1 tablet (8 mg total) by mouth 2 (two) times daily. Take two times a day starting the day after chemo for 3 days. Then take two times a day PRN for nausea or vomiting. 30 tablet 1  . prochlorperazine (COMPAZINE) 10 MG tablet Take 1 tablet (10 mg total) by mouth every 6 (six) hours as needed (Nausea or vomiting). 30 tablet 1  . Red Yeast Rice Extract (RED YEAST RICE PO) Take 1,200 mg by mouth 2 (two) times daily.    . traMADol (ULTRAM) 50 MG tablet Take 1 tablet (50 mg total) by mouth daily as needed. For back pain/joint pain (Patient taking differently: Take 50 mg by mouth daily as needed for moderate pain. For back pain/joint pain) 90 tablet 0  . zinc gluconate 50 MG tablet Take 50 mg by mouth daily.     No current facility-administered medications for this visit.      PHYSICAL EXAMINATION: ECOG PERFORMANCE STATUS: 0 - Asymptomatic Vitals:   10/31/17 0950  BP: 118/74  Pulse: 73  Temp: 97.9 F (36.6 C)   Filed Weights   10/31/17 0950    Weight: 121 lb 2 oz (54.9 kg)    Physical Exam  Constitutional: She is oriented to person, place, and time. She appears well-developed and well-nourished. No distress.  HENT:  Head: Normocephalic and atraumatic.  Right Ear: External ear normal.  Left Ear: External ear normal.  Mouth/Throat: Oropharynx is clear and moist.  Eyes: Pupils are equal, round, and reactive to light. Conjunctivae and EOM are normal. No scleral icterus.  Neck: Normal range of motion. Neck supple.  Cardiovascular: Normal rate, regular rhythm and normal heart sounds.  Pulmonary/Chest: Effort normal and breath sounds normal. No respiratory distress. She has no wheezes. She has no rales. She exhibits no tenderness.  Decreased breath sounds on the left lower lung base  Abdominal: Soft. Bowel sounds are normal. She exhibits no distension and no mass. There is no tenderness.  Musculoskeletal: Normal range of motion. She exhibits no edema or deformity.  Lymphadenopathy:    She has no cervical adenopathy.  Neurological: She is alert and oriented to person, place, and time. No cranial nerve deficit. Coordination normal.  Skin: Skin is warm and dry. No rash noted.  Psychiatric: She has a normal mood and affect. Her behavior is normal. Thought content normal.     LABORATORY DATA:  I have reviewed the data as listed Lab Results  Component Value Date   WBC 5.8 10/18/2017   HGB 12.2 10/18/2017   HCT 35.1 10/18/2017   MCV 88.4 10/18/2017   PLT 205 10/18/2017   Recent Labs  10/05/17 0828 10/10/17 0859 10/18/17 0822  NA 134* 134* 134*  K 3.7 4.0 4.1  CL 99 102 96*  CO2 27 23 29   GLUCOSE 138* 131* 101*  BUN 29* 30* 14  CREATININE 0.73 0.84 0.59  CALCIUM 9.4 9.2 8.5*  GFRNONAA >60 >60 >60  GFRAA >60 >60 >60  PROT 6.7 6.7 5.8*  ALBUMIN 3.9 3.4* 3.1*  AST 29 35 22  ALT 21 35 24  ALKPHOS 119 131* 108  BILITOT 0.5 0.6 0.5       ASSESSMENT & PLAN:  84 year old female who has a history of left lower  lobe primary lung adenocarcinoma present for evaluation of newly diagnosed 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 1. 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 Malignant neoplasm of left lung, unspecified part of lung ([place])   Cancer Staging Malignant neoplasm of unspecified part of unspecified bronchus or lung ([place]) Staging form: Lung, AJCC 8th Edition - Clinical stage from 09/02/2017: Stage IV (cT2a, cN0, pM1a) - Signed by Earlie Server, MD on 09/02/2017  #Stage IV left lower lobe primary lung adenocarcinoma Status post 1 cycle of carbo/Alimta/Keytruda. Overall tolerates well with mild side effects. Labs reviewed, counts acceptable for today's carbo/Alimta/Keytruda treatment.  #Constipation, improved after started on Colace.  Continue Colace 100 mg daily as needed constipation. #Anxiety, improved after started on low-dose Xanax.  Advised patient to continue Xanax 0.25 mg every 2 times daily as needed.  Advised patient not to take Xanax before driving.  #Fatigue, secondary to anti-neoplasm therapy.  Manageable.  Stable.  Continue to monitor. #Fluctuation of blood pressure/hypertension, advised patient continue monitor her blood pressure at home. # Baseline suppressed TSH, monitor TSH closely.   may need endocrinology referral in the future.   #All questions were answered. The patient knows to call the clinic with any problems questions or concerns. Total face to face encounter time for this patient visit was 25 min. >50% of the time was  spent in counseling and coordination of care.   Return of visit: 3 weeks for cycle 3 chemotherapy.  Earlie Server, MD, PhD Hematology Oncology [facility] at [facility] Pager- [phone] 10/31/2017

## 2017-11-07 ENCOUNTER — Telehealth: Payer: Self-pay | Admitting: *Deleted

## 2017-11-07 ENCOUNTER — Telehealth: Payer: Self-pay | Admitting: Cardiovascular Disease

## 2017-11-07 NOTE — Telephone Encounter (Signed)
S/w patient.  She had first round of chemo treatment and did fine, was able to come of BP meds. 7/19 was her second treatment. Since then her BP has been sporadic. She is having trouble know which medications to take and needs guidance. Not sure if the chemo is affecting her BP. 11/05/17 0530 177/92, HR 72 - Took carvedilol, losartan/hctz and xanax.  4461 159/76, HR 62 - Took hydralazine 25 mg.  Noon 143/70, HR 62  1715 157/81  11/06/17 0500 139/78, HR 66 - Took no medications.  4:50 pm 138/80, HR 89 - Took no medications at all this day.  11/07/17 0500 112/62, HR 72 - Took no medications.  11am 190/87 - Took losartan/hctz. Had weird feeling hard to describe.   Feels like it "grips" her and she gets cold feeling. Denies chest   pain, shortness of breath or dizzness.  Noon Took hydralazine 25 mg.  2:08 pm Took Xanax.  5 pm 138/70, HR 76.  Patient's next chemo treatment is 11/21/17.  Routing to Dr Fletcher Anon for advice on BP meds. She did not take anything on 11/06/17. She is not taking meds consistently because afraid of what BP is doing.

## 2017-11-07 NOTE — Telephone Encounter (Signed)
Her blood pressure is fluctuating because she is not taking her blood pressure medications regularly.  I suggest that she takes carvedilol and losartan/hydrochlorthiazide regularly and not as needed.  This will ensure stable blood pressure readings. She can use hydralazine as needed

## 2017-11-07 NOTE — Telephone Encounter (Signed)
Patient calling, states that since she has started chemo her blood pressure has been up and down Would like to know if all the new medication can be conflicting with her usual medication Would like to discuss with nurse Please call

## 2017-11-07 NOTE — Telephone Encounter (Signed)
Pt called in to report that experienced fluctuations with blood pressure over the weekend and wanted recommendations. Stated has been checking blood pressure and only taking certain blood pressure meds each day depending on her BP readings in the morning. States is feeling better today but her blood pressure will fluctuate throughout the week. Informed pt that she will need to contact her cardiologist to further manage her blood pressure and medications. Pt stated that will see how the rest of the week goes and will contact her cardiologist later in the week.

## 2017-11-08 NOTE — Telephone Encounter (Signed)
Phone rang several times. No answer. Will try again later.

## 2017-11-09 ENCOUNTER — Inpatient Hospital Stay: Payer: Medicare HMO | Attending: Oncology | Admitting: Oncology

## 2017-11-09 ENCOUNTER — Encounter: Payer: Self-pay | Admitting: Oncology

## 2017-11-09 VITALS — BP 131/80 | HR 93 | Temp 98.0°F | Resp 20 | Wt 121.0 lb

## 2017-11-09 DIAGNOSIS — Z86010 Personal history of colonic polyps: Secondary | ICD-10-CM | POA: Insufficient documentation

## 2017-11-09 DIAGNOSIS — Z87442 Personal history of urinary calculi: Secondary | ICD-10-CM | POA: Insufficient documentation

## 2017-11-09 DIAGNOSIS — Z5111 Encounter for antineoplastic chemotherapy: Secondary | ICD-10-CM | POA: Diagnosis not present

## 2017-11-09 DIAGNOSIS — F419 Anxiety disorder, unspecified: Secondary | ICD-10-CM | POA: Diagnosis not present

## 2017-11-09 DIAGNOSIS — E785 Hyperlipidemia, unspecified: Secondary | ICD-10-CM | POA: Diagnosis not present

## 2017-11-09 DIAGNOSIS — C3432 Malignant neoplasm of lower lobe, left bronchus or lung: Secondary | ICD-10-CM | POA: Diagnosis not present

## 2017-11-09 DIAGNOSIS — C782 Secondary malignant neoplasm of pleura: Secondary | ICD-10-CM | POA: Insufficient documentation

## 2017-11-09 DIAGNOSIS — K5900 Constipation, unspecified: Secondary | ICD-10-CM | POA: Diagnosis not present

## 2017-11-09 DIAGNOSIS — M129 Arthropathy, unspecified: Secondary | ICD-10-CM

## 2017-11-09 DIAGNOSIS — Z79899 Other long term (current) drug therapy: Secondary | ICD-10-CM

## 2017-11-09 DIAGNOSIS — I10 Essential (primary) hypertension: Secondary | ICD-10-CM | POA: Diagnosis not present

## 2017-11-09 DIAGNOSIS — I2510 Atherosclerotic heart disease of native coronary artery without angina pectoris: Secondary | ICD-10-CM | POA: Diagnosis not present

## 2017-11-09 DIAGNOSIS — Z5112 Encounter for antineoplastic immunotherapy: Secondary | ICD-10-CM | POA: Insufficient documentation

## 2017-11-09 DIAGNOSIS — M545 Low back pain: Secondary | ICD-10-CM | POA: Insufficient documentation

## 2017-11-09 DIAGNOSIS — R69 Illness, unspecified: Secondary | ICD-10-CM | POA: Diagnosis not present

## 2017-11-09 MED ORDER — PAROXETINE HCL 10 MG PO TABS: 10.0000 mg | ORAL_TABLET | Freq: Every day | ORAL | 1 refills | Status: DC

## 2017-11-09 NOTE — Progress Notes (Signed)
Symptom Management Consult note [facility]  [phone] [phone]  Patient Care Team: McLean-Scocuzza, Nino Glow, MD as PCP - General (Internal Medicine) Wellington Hampshire, MD as Consulting Physician (Cardiology) Telford Nab, RN as Registered Nurse   Name of the patient: Tamara Burton  [id number]  1934-03-20   Date of visit: 11/09/17  Diagnosis- Stage IV Lung cancer   Chief complaint/ Reason for visit- Anxiety  Heme/Onc history: Patient was last seen by primary medical oncologist Dr. Tasia Catchings on 10/31/2017 where they assessed her tolerance of cycle 1 carbo/alimta/keytruda.  She tolerated well without major side effects.  They proceeded with cycle 2 carbo/Alimta/Keytruda.  Complained of mild constipation relieved with Colace daily.  Complained of anxiety improved after starting a low-dose Xanax.  Additional complaints included fatigue secondary to chemo and fluctuating blood pressure.  She was advised to monitor blood pressures at home.  Oncology History   Tamara Burton is a  84 y.o.  female with PMH listed below who was referred to me for evaluation of lung cancer.  Patient reports a remote history of lung cancer in 2003 and status post left lower lobectomy.  We obtained medical  records from [facility] in [place].  Reviewed pathology results.  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  Perihilar lymph node negative.  Peri-bronchial lymph node negative. Patient is accompanied by her friend who is her house health power of attorney Patient has noticed mild chronic left side chest wall pain which she described as a stitch, for about 6 months to a year.  Pain is very mild and she takes Tylenol or tramadol as needed.  Pain got little worse early this year. 07/15/2017 CT chest with contrast showed new left pleural process  since prior CT from 2016.  Findings most suspicious for pleural tumor with complex pleural fluid.  Medial left upper lobe atelectasis without obvious tumor. 08/16/2017 PET scan showed multifocal left side pleural hypermetabolic is him and a soft tissue thickening, consistent with pleural metastasis Hyper metabolic soft tissue density within the lingula, suspicious for primary bronchogenic carcinoma.   There is also left adrenal hypermetabolic them is nonspecific and without CT correlate.  Otherwise no evidence of extra thoracic metastatic disease.  Right nephrolithiasis.  # 08/26/2017 CT biopsy of left lower chest pleura showed positive for adenocarcinoma. Patient's case was discussed on tumor board on Sep 01, 2017.  Per pathology, there is no enough tissue for foundation one testing or Omniseq.  Patient was referred by pulmonology Dr. Shawna Orleans to me to discuss about management plan.       Lung cancer ([place])   05/21/2011 Initial Diagnosis    Lung cancer ([place])       Malignant neoplasm of unspecified part of unspecified bronchus or lung ([place])   09/02/2017 Initial Diagnosis    Malignant neoplasm of unspecified part of unspecified bronchus or lung ([place])      09/02/2017 Cancer Staging    Staging form: Lung, AJCC 8th Edition - Clinical stage from 09/02/2017: Stage IV (cT2a, cN0, pM1a) - Signed by Earlie Server, MD on 09/02/2017      09/20/2017 -  Chemotherapy    The patient had palonosetron (ALOXI) injection 0.25 mg, 0.25 mg, Intravenous,  Once, 2 of 4 cycles Administration: 0.25 mg (10/10/2017), 0.25 mg (10/31/2017) PEMEtrexed (ALIMTA) 800 mg in sodium chloride 0.9 % 100 mL chemo infusion, 775 mg, Intravenous,  Once, 2 of 5 cycles Administration: 800 mg (10/10/2017), 800 mg (10/31/2017) CARBOplatin (PARAPLATIN) 320 mg in sodium chloride 0.9 % 250 mL chemo infusion, 320 mg (100 % of original dose 320 mg), Intravenous,  Once, 2 of 4 cycles Dose modification:   (original dose 320 mg, Cycle 1) Administration:  320 mg (10/10/2017), 320 mg (10/31/2017) ondansetron (ZOFRAN) 8 mg, dexamethasone (DECADRON) 10 mg in sodium chloride 0.9 % 50 mL IVPB, , Intravenous,  Once, 0 of 1 cycle pembrolizumab (KEYTRUDA) 200 mg in sodium chloride 0.9 % 50 mL chemo infusion, 200 mg, Intravenous, Once, 2 of 5 cycles Administration: 200 mg (10/10/2017), 200 mg (10/31/2017) fosaprepitant (EMEND) 150 mg, dexamethasone (DECADRON) 12 mg in sodium chloride 0.9 % 145 mL IVPB, , Intravenous,  Once, 2 of 4 cycles Administration:  (10/10/2017),  (10/31/2017)  for chemotherapy treatment.         Interval history- Patient complains of anxiety disorder d/t chemo and hypertension/hypotension episodes  She has the following symptoms: feelings of losing control, racing thoughts. Onset of symptoms was approximately 2 years ago, gradually worsening since that time. She denies current suicidal and homicidal ideation. Family history significant for no psychiatric illness.Possible organic causes contributing are: Recent inititaion of new chemo. Risk factors: none Previous treatment includes Xanax  She complains of the following side effects from the treatment: Fluctuating blood pressure  and continued anxiety.  She does not require Xanax daily.  She is only able to tolerate half 0.25 mg xanax on a  daily basis or otherwise she feels foggy.  Has been keeping a blood pressure log for over 2 weeks now as instructed by Dr. Tasia Catchings.  Blood pressures in the morning are within normal limits but within the hours of 11a-1p her blood pressure can be as elevated as 180's /90's.  Blood pressure is relieved with rest and a full dose 0.25 mg Xanax.  This does not happen every day.  She has recently taken herself off of her blood pressure medicines due to hypotension since starting chemotherapy.   ECOG FS:0 - Asymptomatic  Review of systems- Review of Systems  Constitutional: Negative.  Negative for chills, fever, malaise/fatigue and weight loss.  HENT: Negative for  congestion and ear pain.   Eyes: Negative.  Negative for blurred vision and double vision.  Respiratory: Negative.  Negative for cough, sputum production and shortness of breath.   Cardiovascular: Negative.  Negative for chest pain, palpitations and leg swelling.  Gastrointestinal: Negative.  Negative for abdominal pain, constipation, diarrhea, nausea and vomiting.  Genitourinary: Negative for dysuria, frequency and urgency.  Musculoskeletal: Negative for back pain and falls.  Skin: Negative.  Negative for rash.  Neurological: Negative.  Negative for weakness and headaches.  Endo/Heme/Allergies: Negative.  Does not bruise/bleed easily.  Psychiatric/Behavioral: Negative for depression. The patient is nervous/anxious and has insomnia.      Current treatment- s/p 2 cycles of carbo/Keytruda/Alimta.  Last given on 10/31/2017.   Allergies  Allergen Reactions  . Atorvastatin Other (See Comments)    Muscle weakness  . Penicillins Hives    Has patient had a PCN reaction causing immediate rash, facial/tongue/throat swelling, SOB or lightheadedness with hypotension: Yes Has patient had a PCN reaction causing severe rash involving mucus membranes or skin necrosis: No Has patient had a PCN reaction that required hospitalization: No Has patient had a PCN reaction occurring within the last 10 years: No If all of the above answers are "NO", then may proceed with Cephalosporin use.  . Sulfa Antibiotics  Hives  . Amlodipine     Weakness and confusion     Past Medical History:  Diagnosis Date  . Arthritis    "qwhere"  . Carotid artery disease ([place])   . Chronic lower back pain   . Colon polyps    benign per pt   . Epistaxis    with use of nasal sprays   . Headache    "lately; related to HTN" (04/24/2014)  . Hyperlipidemia   . Hypertension   . Hyponatremia    h/o  . Kidney stone   . Lung cancer ([place]) 2003   s/p left lower lobe resection  . Non-obstructive CAD    a. 04/2014 Cath: LM nl,  LAD/D1/LCX/RCA min irregs, RPDA/PAV nl.  . Orthostatic hypotension   . OSA on CPAP    "wear it intermittently"  . PONV (postoperative nausea and vomiting)    "when I was younger"  . Renal artery stenosis ([place])    a. 04/2014 Renal angiography: LRA 95p, RRA 40ost. PTA of LRA deferred 2/2 guide cath induced Ao dissection;  01/27 6 x 15 mm Herculink stent to the Left-RA  follows with [facility] Dr. Ronalee Belts  . Sinus headache      Past Surgical History:  Procedure Laterality Date  . APPENDECTOMY    . BILATERAL OOPHORECTOMY Bilateral 2000's  . CARDIAC CATHETERIZATION  04/24/2014  . CATARACT EXTRACTION W/ INTRAOCULAR LENS  IMPLANT, BILATERAL Bilateral   . JOINT REPLACEMENT    . LEFT HEART CATHETERIZATION WITH CORONARY ANGIOGRAM N/A 04/24/2014   Procedure: LEFT HEART CATHETERIZATION WITH CORONARY ANGIOGRAM;  Surgeon: Wellington Hampshire, MD;  Location: [facility];  Service: Cardiovascular;  Laterality: N/A;  . LUNG LOBECTOMY Left 2003   "lower lobe"  . PERIPHERAL VASCULAR CATHETERIZATION  05/01/2014   Procedure: RENAL INTERVENTION;  Surgeon: Wellington Hampshire, MD; 6 x 15 mm Herculink stent to the Left Renal Artery  . PORTA CATH INSERTION N/A 09/26/2017   Procedure: PORTA CATH INSERTION;  Surgeon: Algernon Huxley, MD;  Location: [facility];  Service: Cardiovascular;  Laterality: N/A;  . RENAL ANGIOGRAM  04/24/2014  . RENAL ANGIOGRAM N/A 04/24/2014   Procedure: RENAL ANGIOGRAM;  Surgeon: Wellington Hampshire, MD;  Location: [facility];  Service: Cardiovascular;  Laterality: N/A;  . RENAL ANGIOGRAM Left 05/01/2014   Procedure: RENAL ANGIOGRAM;  Surgeon: Wellington Hampshire, MD;  Location: [facility];  Service: Cardiovascular;  Laterality: Left;  . REPLACEMENT TOTAL KNEE Left   . TOTAL HIP ARTHROPLASTY Right   . TUBAL LIGATION  ~ 1975  . VAGINAL DELIVERY  X 2  . VAGINAL HYSTERECTOMY  1980's    Social History   Socioeconomic History  . Marital status: Widowed    Spouse name: Not on file  . Number of  children: Not on file  . Years of education: Not on file  . Highest education level: Not on file  Occupational History  . Not on file  Social Needs  . Financial resource strain: Not hard at all  . Food insecurity:    Worry: Never true    Inability: Never true  . Transportation needs:    Medical: No    Non-medical: No  Tobacco Use  . Smoking status: Never Smoker  . Smokeless tobacco: Never Used  . Tobacco comment: Father and husband both smokers  Substance and Sexual Activity  . Alcohol use: Yes    Comment: 04/24/2014 "glass of beer or wine q couple weeks"  .  Drug use: No  . Sexual activity: Not on file  Lifestyle  . Physical activity:    Days per week: Not on file    Minutes per session: Not on file  . Stress: Not on file  Relationships  . Social connections:    Talks on phone: Not on file    Gets together: Not on file    Attends religious service: Not on file    Active member of club or organization: Not on file    Attends meetings of clubs or organizations: Not on file    Relationship status: Not on file  . Intimate partner violence:    Fear of current or ex partner: Not on file    Emotionally abused: Not on file    Physically abused: Not on file    Forced sexual activity: Not on file  Other Topics Concern  . Not on file  Social History Narrative   Lives in [place]. Daughter nearby. Used to live in [place]   Normal ADLs as of 02/2017 and no h/o falls    Likes to walk   Lives alone     Family History  Problem Relation Age of Onset  . Parkinsonism Mother   . Alzheimer's disease Mother   . Sarcoidosis Brother   . Stroke Maternal Grandmother   . Stomach cancer Paternal Grandmother   . Diabetes Son   . Diabetes Daughter      Current Outpatient Medications:  .  acetaminophen (TYLENOL) 650 MG CR tablet, Take 650 mg by mouth every 8 (eight) hours as needed for pain., Disp: , Rfl:  .  ALPRAZolam (XANAX) 0.25 MG tablet, Take 1 tablet (0.25 mg total) by  mouth 2 (two) times daily as needed for anxiety., Disp: 30 tablet, Rfl: 0 .  Ascorbic Acid (VITAMIN C CR) 1500 MG TBCR, Take 1,500 mg by mouth daily. , Disp: , Rfl:  .  aspirin 81 MG tablet, Take 81 mg by mouth daily., Disp: , Rfl:  .  Biotin 2500 MCG CAPS, Take 1 capsule by mouth daily., Disp: , Rfl:  .  Cholecalciferol (VITAMIN D3) 5000 units CAPS, Take 1 capsule by mouth daily., Disp: , Rfl:  .  Coenzyme Q10 (CO Q-10) 200 MG CAPS, Take 1 capsule by mouth 2 (two) times daily., Disp: , Rfl:  .  dexamethasone (DECADRON) 4 MG tablet, Take 4 mg by mouth., Disp: , Rfl:  .  docusate sodium (COLACE) 100 MG capsule, Take 1 capsule (100 mg total) by mouth daily as needed for mild constipation or moderate constipation. Do not take if you have loose stools, Disp: 30 capsule, Rfl: 2 .  folic acid (FOLVITE) 1 MG tablet, Take 1 tablet (1 mg total) by mouth daily., Disp: 30 tablet, Rfl: 3 .  glucosamine-chondroitin 500-400 MG tablet, Take 1 tablet by mouth 2 (two) times daily., Disp: , Rfl:  .  hydrALAZINE (APRESOLINE) 25 MG tablet, Take 1 tablet (25 mg total) by mouth 3 (three) times daily. (Patient taking differently: Take 25 mg by mouth daily as needed. ), Disp: 90 tablet, Rfl: 6 .  Lactobacillus (ACIDOPHILUS PROBIOTIC) 100 MG CAPS, Take 100 mg by mouth daily., Disp: , Rfl:  .  lidocaine-prilocaine (EMLA) cream, Apply to affected area once, Disp: 30 g, Rfl: 3 .  Magnesium 500 MG CAPS, Take 1 capsule by mouth daily., Disp: , Rfl:  .  Red Yeast Rice Extract (RED YEAST RICE PO), Take 1,200 mg by mouth 2 (two) times daily., Disp: ,  Rfl:  .  traMADol (ULTRAM) 50 MG tablet, Take 1 tablet (50 mg total) by mouth daily as needed. For back pain/joint pain (Patient taking differently: Take 50 mg by mouth daily as needed for moderate pain. For back pain/joint pain), Disp: 90 tablet, Rfl: 0 .  zinc gluconate 50 MG tablet, Take 50 mg by mouth daily., Disp: , Rfl:  .  carvedilol (COREG) 12.5 MG tablet, Take 1 tablet (12.5  mg total) by mouth 2 (two) times daily with a meal. (Patient not taking: Reported on 11/09/2017), Disp: 180 tablet, Rfl: 3 .  ezetimibe (ZETIA) 10 MG tablet, Take 1 tablet (10 mg total) by mouth daily. (Patient not taking: Reported on 11/09/2017), Disp: 90 tablet, Rfl: 3 .  losartan-hydrochlorothiazide (HYZAAR) 100-25 MG tablet, Take 1 tablet by mouth daily. (Patient not taking: Reported on 11/09/2017), Disp: 90 tablet, Rfl: 0 .  ondansetron (ZOFRAN) 8 MG tablet, Take 1 tablet (8 mg total) by mouth 2 (two) times daily. Take two times a day starting the day after chemo for 3 days. Then take two times a day PRN for nausea or vomiting. (Patient not taking: Reported on 11/09/2017), Disp: 30 tablet, Rfl: 1 .  PARoxetine (PAXIL) 10 MG tablet, Take 1 tablet (10 mg total) by mouth daily., Disp: 30 tablet, Rfl: 1 .  prochlorperazine (COMPAZINE) 10 MG tablet, Take 1 tablet (10 mg total) by mouth every 6 (six) hours as needed (Nausea or vomiting). (Patient not taking: Reported on 11/09/2017), Disp: 30 tablet, Rfl: 1  Physical exam:  Vitals:   11/09/17 0930 11/09/17 0932  BP:  131/80  Pulse:  93  Resp:  20  Temp:  98 F (36.7 C)  TempSrc:  Oral  SpO2:  96%  Weight: 121 lb (54.9 kg)    Physical Exam  Constitutional: She is oriented to person, place, and time. Vital signs are normal. She appears well-developed and well-nourished.  HENT:  Head: Normocephalic and atraumatic.  Eyes: Pupils are equal, round, and reactive to light.  Neck: Normal range of motion.  Cardiovascular: Normal rate, regular rhythm and normal heart sounds.  No murmur heard. Pulmonary/Chest: Effort normal and breath sounds normal. She has no wheezes.  Abdominal: Soft. Normal appearance and bowel sounds are normal. She exhibits no distension. There is no tenderness.  Musculoskeletal: Normal range of motion. She exhibits no edema.  Neurological: She is alert and oriented to person, place, and time.  Skin: Skin is warm and dry. No rash noted.    Psychiatric: Her behavior is normal. Judgment normal. Her mood appears anxious. Her speech is rapid and/or pressured.     CMP Latest Ref Rng & Units 10/31/2017  Glucose 70 - 99 mg/dL 133(H)  BUN 8 - 23 mg/dL 24(H)  Creatinine 0.44 - 1.00 mg/dL 0.71  Sodium 135 - 145 mmol/L 136  Potassium 3.5 - 5.1 mmol/L 3.6  Chloride 98 - 111 mmol/L 100  CO2 22 - 32 mmol/L 25  Calcium 8.9 - 10.3 mg/dL 9.3  Total Protein 6.5 - 8.1 g/dL 6.5  Total Bilirubin 0.3 - 1.2 mg/dL 0.2(L)  Alkaline Phos 38 - 126 U/L 114  AST 15 - 41 U/L 22  ALT 0 - 44 U/L 17   CBC Latest Ref Rng & Units 10/31/2017  WBC 3.6 - 11.0 K/uL 10.6  Hemoglobin 12.0 - 16.0 g/dL 11.5(L)  Hematocrit 35.0 - 47.0 % 33.4(L)  Platelets 150 - 440 K/uL 399    No images are attached to the encounter.  No results  found.   Assessment and plan- Patient is a 84 y.o. female who presents for  worsening anxiety.  1.  Stage IV lung cancer: Status post 2 cycles of carbo/Keytruda/Alimta.  Last given on 10/31/2017.  Has tolerated well to date.  2.  Anxiety: Was started on Xanax 0.25 mg twice daily for anxiety.  Unable to tolerate full dose on a normal basis.  Required to take 0.25 mg when experiencing elevated blood pressure.  Patient has been dealing with anxiety for several years now.  She is also  receiving Decadron with treatments and for 3 days following her treatments.  This can be contributing some to her anxiety.  We will speak with Dr. Tasia Catchings about decreasing this dose to see if this helps.  Has never been placed on a long-term antidepressant/anxiety medication.  Recommend Paxil 10 mg daily with the potential to titrate up in 2 weeks tolerating well.  Reviewed side effects of medication including drowsiness, nausea, constipation and weakness.  Educated patient that she is okay to take Xanax with Paxil until her dose is therapeutic.  This likely will take several weeks.  Would recommend she takes at night.  She is scheduled to return to clinic on  11/21/2017 for further evaluation by Dr. Tasia Catchings.  We can assess tolerance of medication at that time.   3.  Hypertension/hypotension: Uncertain of etiology? Likely d/t chemo and administered pre medications that include Decadron. Noticed initial hypotension after initiating chemotherapy.  Her caregiver, [name] who is a Equities trader recommended she stop taking her blood pressure medications which included Coreg 12.5 twice daily and losartan hydrochlorothiazide daily.  I anticipate she will need to resume these once chemotherapy concludes.  Today's blood pressure looks great.   4.  Constipation: Continue Colace as prescribed.  Visit Diagnosis 1. Anxiety   2. Hypertension, unspecified type     Patient expressed understanding and was in agreement with this plan. She also understands that She can call clinic at any time with any questions, concerns, or complaints.   Greater than 50% was spent in counseling and coordination of care with this patient including but not limited to discussion of the relevant topics above (See A&P) including, but not limited to diagnosis and management of acute and chronic medical conditions.    Faythe Casa, AGNP-C [facility] [place]- [phone] Pager- [phone] 11/09/2017 10:45 AM

## 2017-11-09 NOTE — Telephone Encounter (Signed)
Ok. She should let us know if things change.

## 2017-11-09 NOTE — Telephone Encounter (Signed)
Patient says she's doing well. Saw her oncologist today and they discussed her anxiety. Patient feels this is her driving force so she will start Paxil.  Her BP at oncologist was 131/80, HR 93.  Patient has not taken her BP medications over the past 3 days. She says since not taking them states "With nothing, I feel great, like a new person."  She does not want to take any BP medications at this time until she finishes her rounds of chemo. Advised her to continue to monitor BP. Advised I will make Dr Fletcher Anon aware.

## 2017-11-21 ENCOUNTER — Inpatient Hospital Stay (HOSPITAL_BASED_OUTPATIENT_CLINIC_OR_DEPARTMENT_OTHER): Payer: Medicare HMO | Admitting: Oncology

## 2017-11-21 ENCOUNTER — Encounter: Payer: Self-pay | Admitting: Oncology

## 2017-11-21 ENCOUNTER — Other Ambulatory Visit: Payer: Self-pay

## 2017-11-21 ENCOUNTER — Inpatient Hospital Stay: Payer: Medicare HMO

## 2017-11-21 VITALS — BP 136/82 | HR 99 | Temp 97.5°F | Resp 18 | Wt 121.0 lb

## 2017-11-21 DIAGNOSIS — I10 Essential (primary) hypertension: Secondary | ICD-10-CM | POA: Diagnosis not present

## 2017-11-21 DIAGNOSIS — C782 Secondary malignant neoplasm of pleura: Secondary | ICD-10-CM | POA: Diagnosis not present

## 2017-11-21 DIAGNOSIS — M129 Arthropathy, unspecified: Secondary | ICD-10-CM | POA: Diagnosis not present

## 2017-11-21 DIAGNOSIS — I2510 Atherosclerotic heart disease of native coronary artery without angina pectoris: Secondary | ICD-10-CM | POA: Diagnosis not present

## 2017-11-21 DIAGNOSIS — Z87442 Personal history of urinary calculi: Secondary | ICD-10-CM

## 2017-11-21 DIAGNOSIS — Z5111 Encounter for antineoplastic chemotherapy: Secondary | ICD-10-CM

## 2017-11-21 DIAGNOSIS — K5900 Constipation, unspecified: Secondary | ICD-10-CM | POA: Diagnosis not present

## 2017-11-21 DIAGNOSIS — M545 Low back pain: Secondary | ICD-10-CM

## 2017-11-21 DIAGNOSIS — C3432 Malignant neoplasm of lower lobe, left bronchus or lung: Secondary | ICD-10-CM | POA: Diagnosis not present

## 2017-11-21 DIAGNOSIS — C3490 Malignant neoplasm of unspecified part of unspecified bronchus or lung: Secondary | ICD-10-CM

## 2017-11-21 DIAGNOSIS — E785 Hyperlipidemia, unspecified: Secondary | ICD-10-CM

## 2017-11-21 DIAGNOSIS — Z86010 Personal history of colonic polyps: Secondary | ICD-10-CM

## 2017-11-21 DIAGNOSIS — F419 Anxiety disorder, unspecified: Secondary | ICD-10-CM

## 2017-11-21 DIAGNOSIS — Z5112 Encounter for antineoplastic immunotherapy: Secondary | ICD-10-CM

## 2017-11-21 DIAGNOSIS — Z79899 Other long term (current) drug therapy: Secondary | ICD-10-CM

## 2017-11-21 DIAGNOSIS — R69 Illness, unspecified: Secondary | ICD-10-CM | POA: Diagnosis not present

## 2017-11-21 LAB — COMPREHENSIVE METABOLIC PANEL
ALT: 15 U/L (ref 0–44)
ANION GAP: 12 (ref 5–15)
AST: 26 U/L (ref 15–41)
Albumin: 3.6 g/dL (ref 3.5–5.0)
Alkaline Phosphatase: 114 U/L (ref 38–126)
BUN: 14 mg/dL (ref 8–23)
CHLORIDE: 102 mmol/L (ref 98–111)
CO2: 25 mmol/L (ref 22–32)
Calcium: 9.4 mg/dL (ref 8.9–10.3)
Creatinine, Ser: 0.74 mg/dL (ref 0.44–1.00)
Glucose, Bld: 154 mg/dL — ABNORMAL HIGH (ref 70–99)
POTASSIUM: 4.2 mmol/L (ref 3.5–5.1)
Sodium: 139 mmol/L (ref 135–145)
TOTAL PROTEIN: 6.4 g/dL — AB (ref 6.5–8.1)
Total Bilirubin: 0.5 mg/dL (ref 0.3–1.2)

## 2017-11-21 LAB — CBC WITH DIFFERENTIAL/PLATELET
BASOS ABS: 0 10*3/uL (ref 0–0.1)
Basophils Relative: 0 %
Eosinophils Absolute: 0 10*3/uL (ref 0–0.7)
Eosinophils Relative: 0 %
HCT: 32.2 % — ABNORMAL LOW (ref 35.0–47.0)
HEMOGLOBIN: 11.1 g/dL — AB (ref 12.0–16.0)
LYMPHS PCT: 8 %
Lymphs Abs: 0.7 10*3/uL — ABNORMAL LOW (ref 1.0–3.6)
MCH: 30.2 pg (ref 26.0–34.0)
MCHC: 34.3 g/dL (ref 32.0–36.0)
MCV: 88 fL (ref 80.0–100.0)
Monocytes Absolute: 0.9 10*3/uL (ref 0.2–0.9)
Monocytes Relative: 9 %
NEUTROS PCT: 83 %
Neutro Abs: 8.1 10*3/uL — ABNORMAL HIGH (ref 1.4–6.5)
Platelets: 382 10*3/uL (ref 150–440)
RBC: 3.66 MIL/uL — AB (ref 3.80–5.20)
RDW: 13.4 % (ref 11.5–14.5)
WBC: 9.7 10*3/uL (ref 3.6–11.0)

## 2017-11-21 MED ORDER — SODIUM CHLORIDE 0.9 % IV SOLN
Freq: Once | INTRAVENOUS | Status: AC
  Administered 2017-11-21: 10:00:00 via INTRAVENOUS
  Filled 2017-11-21: qty 1000

## 2017-11-21 MED ORDER — PALONOSETRON HCL INJECTION 0.25 MG/5ML
0.2500 mg | Freq: Once | INTRAVENOUS | Status: AC
  Administered 2017-11-21: 0.25 mg via INTRAVENOUS
  Filled 2017-11-21: qty 5

## 2017-11-21 MED ORDER — PEMETREXED DISODIUM CHEMO INJECTION 500 MG
800.0000 mg | Freq: Once | INTRAVENOUS | Status: AC
  Administered 2017-11-21: 800 mg via INTRAVENOUS
  Filled 2017-11-21: qty 20

## 2017-11-21 MED ORDER — SODIUM CHLORIDE 0.9 % IV SOLN
320.0000 mg | Freq: Once | INTRAVENOUS | Status: AC
  Administered 2017-11-21: 320 mg via INTRAVENOUS
  Filled 2017-11-21: qty 32

## 2017-11-21 MED ORDER — SODIUM CHLORIDE 0.9 % IV SOLN
Freq: Once | INTRAVENOUS | Status: AC
  Administered 2017-11-21: 10:00:00 via INTRAVENOUS
  Filled 2017-11-21: qty 5

## 2017-11-21 MED ORDER — HEPARIN SOD (PORK) LOCK FLUSH 100 UNIT/ML IV SOLN
500.0000 [IU] | Freq: Once | INTRAVENOUS | Status: AC | PRN
  Administered 2017-11-21: 500 [IU]
  Filled 2017-11-21: qty 5

## 2017-11-21 MED ORDER — HEPARIN SOD (PORK) LOCK FLUSH 100 UNIT/ML IV SOLN: 500.0000 [IU] | Freq: Once | INTRAVENOUS | Status: DC

## 2017-11-21 MED ORDER — SODIUM CHLORIDE 0.9 % IV SOLN
200.0000 mg | Freq: Once | INTRAVENOUS | Status: AC
  Administered 2017-11-21: 200 mg via INTRAVENOUS
  Filled 2017-11-21: qty 8

## 2017-11-21 MED ORDER — SODIUM CHLORIDE 0.9% FLUSH
10.0000 mL | Freq: Once | INTRAVENOUS | Status: AC
  Administered 2017-11-21: 10 mL via INTRAVENOUS
  Filled 2017-11-21: qty 10

## 2017-11-21 NOTE — Progress Notes (Signed)
Patient here for follow up

## 2017-11-21 NOTE — Progress Notes (Signed)
Hematology/Oncology follow up note [facility] Telephone:([phone]) [phone] [phone]   Patient Care Team: McLean-Scocuzza, Nino Glow, MD as PCP - General (Internal Medicine) Wellington Hampshire, MD as Consulting Physician (Cardiology) Telford Nab, RN as Registered Nurse  REFERRING PROVIDER: [place] VISIT  follow-up for assessment after chemotherapy treatment for stage IV lung adenocarcinoma. HISTORY OF PRESENTING ILLNESS:  Tamara Burton is a  84 y.o.  female with PMH listed below who was referred to me for evaluation of lung cancer.  Patient reports a remote history of lung cancer in 2003 and status post left lower lobectomy.  We obtained medical  records from [facility] in [place].  Reviewed pathology results.  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  Perihilar lymph node negative.  Peri-bronchial lymph node negative. Patient is accompanied by her friend who is her house health power of attorney Patient has noticed mild chronic left side chest wall pain which she described as a stitch, for about 6 months to a year.  Pain is very mild and she takes Tylenol or tramadol as needed.  Pain got little worse early this year. 07/15/2017 CT chest with contrast showed new left pleural process since prior CT from 2016.  Findings most suspicious for pleural tumor with complex pleural fluid.  Medial left upper lobe atelectasis without obvious tumor. 08/16/2017 PET scan showed multifocal left side pleural hypermetabolic is him and a soft tissue thickening, consistent with pleural metastasis Hyper metabolic soft tissue density within the lingula, suspicious for primary bronchogenic carcinoma.   There is also left adrenal hypermetabolic them is nonspecific and without CT correlate.  Otherwise no evidence of extra thoracic metastatic  disease.  Right nephrolithiasis.  # 08/26/2017 CT biopsy of left lower chest pleura showed positive for adenocarcinoma.   Patient's case was discussed on tumor board on Sep 01, 2017.  Per pathology, there is no enough tissue for foundation one testing or Omniseq.  Patient was referred by pulmonology Dr. Shawna Orleans to me to discuss about management plan.  INTERVAL HISTORY Tamara Burton is a 84 y.o. female who has above history reviewed by me who presents for assessment prior to cycle 3 chemotherapy and immunotherapy. Patient reports feeling well today.  #Chemotherapy/immunotherapy, reports tolerating.  Denies nausea vomiting or diarrhea.  Denies any fever or chills. #Anxiety, patient use Xanax 0.25 mg twice daily as needed for anxiety as well as Paxil.  Reports that anxiety is better controlled recently. #Fatigue, stable, she usually feels more fatigue after chemotherapy and slightly getting better when she is close to get next cycle. #Left lower anterior chest wall pain, intermittent, she only has achiness when she sleeps on her right.  Stable.   Review of Systems  Constitutional: Positive for malaise/fatigue. Negative for chills, fever and weight loss.  HENT: Negative for congestion, ear discharge, ear pain, nosebleeds, sinus pain and sore throat.   Eyes: Negative for double vision, photophobia, pain, discharge and redness.  Respiratory: Negative for cough, hemoptysis, sputum production, shortness of breath and wheezing.   Cardiovascular: Negative for chest pain, palpitations, orthopnea, claudication and leg swelling.  Gastrointestinal: Negative for abdominal pain, blood in stool, constipation, diarrhea, heartburn, melena, nausea and vomiting.  Genitourinary: Negative for dysuria, flank pain, frequency and hematuria.  Musculoskeletal: Negative for back pain, myalgias and neck pain.       Intermittent mild left lower anterior chest wall pain.  Skin:  Negative for itching and rash.    Neurological: Negative for dizziness, tingling, tremors, focal weakness, weakness and headaches.  Endo/Heme/Allergies: Negative for environmental allergies. Does not bruise/bleed easily.  Psychiatric/Behavioral: Negative for depression and hallucinations. The patient is not nervous/anxious.     MEDICAL HISTORY:  Past Medical History:  Diagnosis Date  . Arthritis    "qwhere"  . Carotid artery disease ([place])   . Chronic lower back pain   . Colon polyps    benign per pt   . Epistaxis    with use of nasal sprays   . Headache    "lately; related to HTN" (04/24/2014)  . Hyperlipidemia   . Hypertension   . Hyponatremia    h/o  . Kidney stone   . Lung cancer ([place]) 2003   s/p left lower lobe resection  . Non-obstructive CAD    a. 04/2014 Cath: LM nl, LAD/D1/LCX/RCA min irregs, RPDA/PAV nl.  . Orthostatic hypotension   . OSA on CPAP    "wear it intermittently"  . PONV (postoperative nausea and vomiting)    "when I was younger"  . Renal artery stenosis ([place])    a. 04/2014 Renal angiography: LRA 95p, RRA 40ost. PTA of LRA deferred 2/2 guide cath induced Ao dissection;  01/27 6 x 15 mm Herculink stent to the Left-RA  follows with [facility] Dr. Ronalee Belts  . Sinus headache     SURGICAL HISTORY: Past Surgical History:  Procedure Laterality Date  . APPENDECTOMY    . BILATERAL OOPHORECTOMY Bilateral 2000's  . CARDIAC CATHETERIZATION  04/24/2014  . CATARACT EXTRACTION W/ INTRAOCULAR LENS  IMPLANT, BILATERAL Bilateral   . JOINT REPLACEMENT    . LEFT HEART CATHETERIZATION WITH CORONARY ANGIOGRAM N/A 04/24/2014   Procedure: LEFT HEART CATHETERIZATION WITH CORONARY ANGIOGRAM;  Surgeon: Wellington Hampshire, MD;  Location: [facility] CATH LAB;  Service: Cardiovascular;  Laterality: N/A;  . LUNG LOBECTOMY Left 2003   "lower lobe"  . PERIPHERAL VASCULAR CATHETERIZATION  05/01/2014   Procedure: RENAL INTERVENTION;  Surgeon: Wellington Hampshire, MD; 6 x 15 mm Herculink stent to the Left Renal Artery  . PORTA CATH  INSERTION N/A 09/26/2017   Procedure: PORTA CATH INSERTION;  Surgeon: Algernon Huxley, MD;  Location: [facility] CV LAB;  Service: Cardiovascular;  Laterality: N/A;  . RENAL ANGIOGRAM  04/24/2014  . RENAL ANGIOGRAM N/A 04/24/2014   Procedure: RENAL ANGIOGRAM;  Surgeon: Wellington Hampshire, MD;  Location: [facility] CATH LAB;  Service: Cardiovascular;  Laterality: N/A;  . RENAL ANGIOGRAM Left 05/01/2014   Procedure: RENAL ANGIOGRAM;  Surgeon: Wellington Hampshire, MD;  Location: [facility] CATH LAB;  Service: Cardiovascular;  Laterality: Left;  . REPLACEMENT TOTAL KNEE Left   . TOTAL HIP ARTHROPLASTY Right   . TUBAL LIGATION  ~ 1975  . VAGINAL DELIVERY  X 2  . VAGINAL HYSTERECTOMY  1980's    SOCIAL HISTORY: Social History   Socioeconomic History  . Marital status: Widowed    Spouse name: Not on file  . Number of children: Not on file  . Years of education: Not on file  . Highest education level: Not on file  Occupational History  . Not on file  Social Needs  . Financial resource strain: Not hard at all  . Food insecurity:    Worry: Never true    Inability: Never true  . Transportation needs:    Medical: No    Non-medical: No  Tobacco Use  . Smoking status: Never Smoker  . Smokeless tobacco: Never  Used  . Tobacco comment: Father and husband both smokers  Substance and Sexual Activity  . Alcohol use: Yes    Comment: 04/24/2014 "glass of beer or wine q couple weeks"  . Drug use: No  . Sexual activity: Not on file  Lifestyle  . Physical activity:    Days per week: Not on file    Minutes per session: Not on file  . Stress: Not on file  Relationships  . Social connections:    Talks on phone: Not on file    Gets together: Not on file    Attends religious service: Not on file    Active member of club or organization: Not on file    Attends meetings of clubs or organizations: Not on file    Relationship status: Not on file  . Intimate partner violence:    Fear of current or ex partner: Not on file      Emotionally abused: Not on file    Physically abused: Not on file    Forced sexual activity: Not on file  Other Topics Concern  . Not on file  Social History Narrative   Lives in [place]. Daughter nearby. Used to live in [place]   Normal ADLs as of 02/2017 and no h/o falls    Likes to walk   Lives alone     FAMILY HISTORY: Family History  Problem Relation Age of Onset  . Parkinsonism Mother   . Alzheimer's disease Mother   . Sarcoidosis Brother   . Stroke Maternal Grandmother   . Stomach cancer Paternal Grandmother   . Diabetes Son   . Diabetes Daughter     ALLERGIES:  is allergic to atorvastatin; penicillins; sulfa antibiotics; and amlodipine.  MEDICATIONS:  Current Outpatient Medications  Medication Sig Dispense Refill  . acetaminophen (TYLENOL) 650 MG CR tablet Take 650 mg by mouth every 8 (eight) hours as needed for pain.    Marland Kitchen ALPRAZolam (XANAX) 0.25 MG tablet Take 1 tablet (0.25 mg total) by mouth 2 (two) times daily as needed for anxiety. 30 tablet 0  . Ascorbic Acid (VITAMIN C CR) 1500 MG TBCR Take 1,500 mg by mouth daily.     Marland Kitchen aspirin 81 MG tablet Take 81 mg by mouth daily.    . Biotin 2500 MCG CAPS Take 1 capsule by mouth daily.    . Cholecalciferol (VITAMIN D3) 5000 units CAPS Take 1 capsule by mouth daily.    . Coenzyme Q10 (CO Q-10) 200 MG CAPS Take 1 capsule by mouth 2 (two) times daily.    Marland Kitchen dexamethasone (DECADRON) 4 MG tablet Take 4 mg by mouth.    . docusate sodium (COLACE) 100 MG capsule Take 1 capsule (100 mg total) by mouth daily as needed for mild constipation or moderate constipation. Do not take if you have loose stools 30 capsule 2  . folic acid (FOLVITE) 1 MG tablet Take 1 tablet (1 mg total) by mouth daily. 30 tablet 3  . glucosamine-chondroitin 500-400 MG tablet Take 1 tablet by mouth 2 (two) times daily.    . hydrALAZINE (APRESOLINE) 25 MG tablet Take 1 tablet (25 mg total) by mouth 3 (three) times daily. (Patient taking differently:  Take 25 mg by mouth daily as needed. ) 90 tablet 6  . lidocaine-prilocaine (EMLA) cream Apply to affected area once 30 g 3  . Magnesium 500 MG CAPS Take 1 capsule by mouth daily.    . ondansetron (ZOFRAN) 8 MG tablet Take 1 tablet (  8 mg total) by mouth 2 (two) times daily. Take two times a day starting the day after chemo for 3 days. Then take two times a day PRN for nausea or vomiting. 30 tablet 1  . PARoxetine (PAXIL) 10 MG tablet Take 1 tablet (10 mg total) by mouth daily. 30 tablet 1  . prochlorperazine (COMPAZINE) 10 MG tablet Take 1 tablet (10 mg total) by mouth every 6 (six) hours as needed (Nausea or vomiting). 30 tablet 1  . Red Yeast Rice Extract (RED YEAST RICE PO) Take 1,200 mg by mouth 2 (two) times daily.    . traMADol (ULTRAM) 50 MG tablet Take 1 tablet (50 mg total) by mouth daily as needed. For back pain/joint pain (Patient taking differently: Take 50 mg by mouth daily as needed for moderate pain. For back pain/joint pain) 90 tablet 0  . zinc gluconate 50 MG tablet Take 50 mg by mouth daily.    . carvedilol (COREG) 12.5 MG tablet Take 1 tablet (12.5 mg total) by mouth 2 (two) times daily with a meal. (Patient not taking: Reported on 11/09/2017) 180 tablet 3  . ezetimibe (ZETIA) 10 MG tablet Take 1 tablet (10 mg total) by mouth daily. (Patient not taking: Reported on 11/09/2017) 90 tablet 3  . Lactobacillus (ACIDOPHILUS PROBIOTIC) 100 MG CAPS Take 100 mg by mouth daily.    Marland Kitchen losartan-hydrochlorothiazide (HYZAAR) 100-25 MG tablet Take 1 tablet by mouth daily. (Patient not taking: Reported on 11/09/2017) 90 tablet 0   No current facility-administered medications for this visit.    Facility-Administered Medications Ordered in Other Visits  Medication Dose Route Frequency Provider Last Rate Last Dose  . heparin lock flush 100 unit/mL  500 Units Intravenous Once Earlie Server, MD         PHYSICAL EXAMINATION: ECOG PERFORMANCE STATUS: 0 - Asymptomatic Vitals:   11/21/17 0839  BP: 136/82    Pulse: 99  Resp: 18  Temp: (!) 97.5 F (36.4 C)  SpO2: 95%   Filed Weights   11/21/17 0839  Weight: 121 lb (54.9 kg)    Physical Exam  Constitutional: She is oriented to person, place, and time. She appears well-developed and well-nourished. No distress.  HENT:  Head: Normocephalic and atraumatic.  Right Ear: External ear normal.  Left Ear: External ear normal.  Mouth/Throat: Oropharynx is clear and moist.  Eyes: Pupils are equal, round, and reactive to light. Conjunctivae and EOM are normal. No scleral icterus.  Neck: Normal range of motion. Neck supple.  Cardiovascular: Normal rate, regular rhythm and normal heart sounds.  Pulmonary/Chest: Effort normal and breath sounds normal. No respiratory distress. She has no wheezes. She has no rales. She exhibits no tenderness.  Decreased breath sounds on the left lower lung base  Abdominal: Soft. Bowel sounds are normal. She exhibits no distension and no mass. There is no tenderness.  Musculoskeletal: Normal range of motion. She exhibits no edema or deformity.  Lymphadenopathy:    She has no cervical adenopathy.  Neurological: She is alert and oriented to person, place, and time. No cranial nerve deficit. Coordination normal.  Skin: Skin is warm and dry. No rash noted. No erythema.  Psychiatric: She has a normal mood and affect. Her behavior is normal. Thought content normal.     LABORATORY DATA:  I have reviewed the data as listed Lab Results  Component Value Date   WBC 9.7 11/21/2017   HGB 11.1 (L) 11/21/2017   HCT 32.2 (L) 11/21/2017   MCV 88.0 11/21/2017  PLT 382 11/21/2017   Recent Labs    10/10/17 0859 10/18/17 0822 10/31/17 0820  NA 134* 134* 136  K 4.0 4.1 3.6  CL 102 96* 100  CO2 23 29 25   GLUCOSE 131* 101* 133*  BUN 30* 14 24*  CREATININE 0.84 0.59 0.71  CALCIUM 9.2 8.5* 9.3  GFRNONAA >60 >60 >60  GFRAA >60 >60 >60  PROT 6.7 5.8* 6.5  ALBUMIN 3.4* 3.1* 3.5  AST 35 22 22  ALT 35 24 17  ALKPHOS 131*  108 114  BILITOT 0.6 0.5 0.2*       ASSESSMENT & PLAN:  84 year old female who has a history of left lower lobe primary lung adenocarcinoma present for evaluation of newly diagnosed 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 1. Malignant neoplasm of unspecified part of unspecified bronchus or lung ([place])   2. 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 Anxiety   Cancer Staging Malignant neoplasm of unspecified part of unspecified bronchus or lung ([place]) Staging form: Lung, AJCC 8th Edition - Clinical stage from 09/02/2017: Stage IV (cT2a, cN0, pM1a) - Signed by Earlie Server, MD on 09/02/2017  #Stage IV left lower lobe primary lung adenocarcinoma S/p 2 cycles  of carbo/Alimta/Keytruda [ based on Keynote 189 study, triplet improved ORR, PFS, and OS].  Overall tolerates chemotherapy and immunotherapy with mild side effects.  Labs are reviewed counts acceptable for today's carbo Alimta and Keytruda treatment.. Will obtain PET scan prior to next cycle. Continue folic acid daily.  She will need B12 injection at next visit.  #Constipation, improved after starting on Colace.  Continue Colace 100 mg daily as needed constipation. #Anxiety, improved.  Currently on Xanax and Paxil.    #Fatigue, secondary to chemotherapy.  Manageable.  Continue to monitor. #Baseline suppressed TSH.  Monitor TSH closely.  Will check total T3 and next visit as well.   #All questions were answered. The patient knows to call the clinic with any problems questions or concerns. Total face to face encounter time for this patient visit was 25 min. >50% of the time was  spent in counseling and coordination of care.   Return of visit: 3 weeks for cycle 4 chemotherapy and immunotherapy.  Earlie Server, MD, PhD Hematology Oncology [facility] at [facility] Pager- [phone] 11/21/2017

## 2017-12-06 ENCOUNTER — Ambulatory Visit
Admission: RE | Admit: 2017-12-06 | Discharge: 2017-12-06 | Disposition: A | Discharge status: Home / Self Care | Payer: Medicare HMO | Source: Ambulatory Visit | Attending: Oncology | Admitting: Oncology

## 2017-12-06 DIAGNOSIS — C782 Secondary malignant neoplasm of pleura: Secondary | ICD-10-CM

## 2017-12-06 DIAGNOSIS — J90 Pleural effusion, not elsewhere classified: Secondary | ICD-10-CM | POA: Insufficient documentation

## 2017-12-06 DIAGNOSIS — I700 Atherosclerosis of aorta: Secondary | ICD-10-CM | POA: Insufficient documentation

## 2017-12-06 DIAGNOSIS — C3490 Malignant neoplasm of unspecified part of unspecified bronchus or lung: Secondary | ICD-10-CM

## 2017-12-06 DIAGNOSIS — N200 Calculus of kidney: Secondary | ICD-10-CM | POA: Diagnosis not present

## 2017-12-06 DIAGNOSIS — F419 Anxiety disorder, unspecified: Secondary | ICD-10-CM

## 2017-12-06 DIAGNOSIS — I2510 Atherosclerotic heart disease of native coronary artery without angina pectoris: Secondary | ICD-10-CM | POA: Diagnosis not present

## 2017-12-06 LAB — GLUCOSE, CAPILLARY: GLUCOSE-CAPILLARY: 101 mg/dL — AB (ref 70–99)

## 2017-12-06 MED ORDER — FLUDEOXYGLUCOSE F - 18 (FDG) INJECTION
6.3000 | Freq: Once | INTRAVENOUS | Status: AC | PRN
Start: 2017-12-06 — End: 2017-12-06
  Administered 2017-12-06: 6.51 via INTRAVENOUS

## 2017-12-08 ENCOUNTER — Other Ambulatory Visit: Payer: Self-pay | Admitting: *Deleted

## 2017-12-08 DIAGNOSIS — C3490 Malignant neoplasm of unspecified part of unspecified bronchus or lung: Secondary | ICD-10-CM

## 2017-12-08 MED ORDER — ONDANSETRON HCL 8 MG PO TABS: 8.0000 mg | ORAL_TABLET | Freq: Two times a day (BID) | ORAL | 1 refills | Status: DC

## 2017-12-08 NOTE — Telephone Encounter (Signed)
Request for refill of ondansetron. Refill escribed to pharmacy.

## 2017-12-12 ENCOUNTER — Encounter: Payer: Self-pay | Admitting: Oncology

## 2017-12-12 ENCOUNTER — Inpatient Hospital Stay: Payer: Medicare HMO | Attending: Oncology

## 2017-12-12 ENCOUNTER — Inpatient Hospital Stay: Payer: Medicare HMO

## 2017-12-12 ENCOUNTER — Other Ambulatory Visit: Payer: Self-pay | Admitting: Oncology

## 2017-12-12 ENCOUNTER — Other Ambulatory Visit: Payer: Self-pay

## 2017-12-12 ENCOUNTER — Inpatient Hospital Stay (HOSPITAL_BASED_OUTPATIENT_CLINIC_OR_DEPARTMENT_OTHER): Payer: Medicare HMO | Admitting: Oncology

## 2017-12-12 VITALS — BP 142/72 | HR 80 | Temp 97.2°F | Resp 18 | Wt 120.5 lb

## 2017-12-12 DIAGNOSIS — I701 Atherosclerosis of renal artery: Secondary | ICD-10-CM | POA: Diagnosis not present

## 2017-12-12 DIAGNOSIS — I2510 Atherosclerotic heart disease of native coronary artery without angina pectoris: Secondary | ICD-10-CM

## 2017-12-12 DIAGNOSIS — Z7982 Long term (current) use of aspirin: Secondary | ICD-10-CM

## 2017-12-12 DIAGNOSIS — Z79899 Other long term (current) drug therapy: Secondary | ICD-10-CM

## 2017-12-12 DIAGNOSIS — G4730 Sleep apnea, unspecified: Secondary | ICD-10-CM | POA: Insufficient documentation

## 2017-12-12 DIAGNOSIS — C3490 Malignant neoplasm of unspecified part of unspecified bronchus or lung: Secondary | ICD-10-CM

## 2017-12-12 DIAGNOSIS — I700 Atherosclerosis of aorta: Secondary | ICD-10-CM | POA: Diagnosis not present

## 2017-12-12 DIAGNOSIS — J90 Pleural effusion, not elsewhere classified: Secondary | ICD-10-CM

## 2017-12-12 DIAGNOSIS — Z800 Family history of malignant neoplasm of digestive organs: Secondary | ICD-10-CM | POA: Diagnosis not present

## 2017-12-12 DIAGNOSIS — C7802 Secondary malignant neoplasm of left lung: Secondary | ICD-10-CM | POA: Insufficient documentation

## 2017-12-12 DIAGNOSIS — C782 Secondary malignant neoplasm of pleura: Secondary | ICD-10-CM

## 2017-12-12 DIAGNOSIS — Z5111 Encounter for antineoplastic chemotherapy: Secondary | ICD-10-CM | POA: Insufficient documentation

## 2017-12-12 DIAGNOSIS — E785 Hyperlipidemia, unspecified: Secondary | ICD-10-CM | POA: Diagnosis not present

## 2017-12-12 DIAGNOSIS — N200 Calculus of kidney: Secondary | ICD-10-CM

## 2017-12-12 DIAGNOSIS — R079 Chest pain, unspecified: Secondary | ICD-10-CM | POA: Insufficient documentation

## 2017-12-12 DIAGNOSIS — Z87442 Personal history of urinary calculi: Secondary | ICD-10-CM

## 2017-12-12 DIAGNOSIS — I10 Essential (primary) hypertension: Secondary | ICD-10-CM | POA: Diagnosis not present

## 2017-12-12 DIAGNOSIS — Z5112 Encounter for antineoplastic immunotherapy: Secondary | ICD-10-CM | POA: Insufficient documentation

## 2017-12-12 DIAGNOSIS — C3492 Malignant neoplasm of unspecified part of left bronchus or lung: Secondary | ICD-10-CM

## 2017-12-12 DIAGNOSIS — R5383 Other fatigue: Secondary | ICD-10-CM | POA: Insufficient documentation

## 2017-12-12 DIAGNOSIS — F419 Anxiety disorder, unspecified: Secondary | ICD-10-CM | POA: Insufficient documentation

## 2017-12-12 DIAGNOSIS — M129 Arthropathy, unspecified: Secondary | ICD-10-CM | POA: Insufficient documentation

## 2017-12-12 DIAGNOSIS — Z86010 Personal history of colonic polyps: Secondary | ICD-10-CM | POA: Diagnosis not present

## 2017-12-12 DIAGNOSIS — K5900 Constipation, unspecified: Secondary | ICD-10-CM | POA: Insufficient documentation

## 2017-12-12 DIAGNOSIS — C3432 Malignant neoplasm of lower lobe, left bronchus or lung: Secondary | ICD-10-CM | POA: Insufficient documentation

## 2017-12-12 LAB — CBC WITH DIFFERENTIAL/PLATELET
BASOS ABS: 0 10*3/uL (ref 0–0.1)
BASOS PCT: 0 %
EOS ABS: 0 10*3/uL (ref 0–0.7)
Eosinophils Relative: 0 %
HCT: 31 % — ABNORMAL LOW (ref 35.0–47.0)
Hemoglobin: 10.6 g/dL — ABNORMAL LOW (ref 12.0–16.0)
Lymphocytes Relative: 10 %
Lymphs Abs: 0.9 10*3/uL — ABNORMAL LOW (ref 1.0–3.6)
MCH: 29.8 pg (ref 26.0–34.0)
MCHC: 34.1 g/dL (ref 32.0–36.0)
MCV: 87.3 fL (ref 80.0–100.0)
Monocytes Absolute: 1.2 10*3/uL — ABNORMAL HIGH (ref 0.2–0.9)
Monocytes Relative: 13 %
NEUTROS ABS: 7.2 10*3/uL — AB (ref 1.4–6.5)
Neutrophils Relative %: 77 %
Platelets: 388 10*3/uL (ref 150–440)
RBC: 3.55 MIL/uL — AB (ref 3.80–5.20)
RDW: 14.2 % (ref 11.5–14.5)
WBC: 9.3 10*3/uL (ref 3.6–11.0)

## 2017-12-12 LAB — COMPREHENSIVE METABOLIC PANEL
ALT: 14 U/L (ref 0–44)
ANION GAP: 8 (ref 5–15)
AST: 27 U/L (ref 15–41)
Albumin: 3.7 g/dL (ref 3.5–5.0)
Alkaline Phosphatase: 101 U/L (ref 38–126)
BUN: 16 mg/dL (ref 8–23)
CALCIUM: 9.2 mg/dL (ref 8.9–10.3)
CO2: 28 mmol/L (ref 22–32)
CREATININE: 0.71 mg/dL (ref 0.44–1.00)
Chloride: 101 mmol/L (ref 98–111)
GFR calc Af Amer: >60 mL/min (ref >60)
Glucose, Bld: 140 mg/dL — ABNORMAL HIGH (ref 70–99)
Potassium: 4 mmol/L (ref 3.5–5.1)
Sodium: 137 mmol/L (ref 135–145)
Total Bilirubin: 0.6 mg/dL (ref 0.3–1.2)
Total Protein: 6.4 g/dL — ABNORMAL LOW (ref 6.5–8.1)

## 2017-12-12 LAB — TSH: TSH: 0.293 u[IU]/mL — ABNORMAL LOW (ref 0.350–4.500)

## 2017-12-12 MED ORDER — CYANOCOBALAMIN 1000 MCG/ML IJ SOLN
1000.0000 ug | Freq: Once | INTRAMUSCULAR | Status: AC
  Administered 2017-12-12: 1000 ug via INTRAMUSCULAR
  Filled 2017-12-12: qty 1

## 2017-12-12 MED ORDER — ONDANSETRON HCL 8 MG PO TABS: 8.0000 mg | ORAL_TABLET | Freq: Two times a day (BID) | ORAL | 1 refills | Status: DC

## 2017-12-12 MED ORDER — SODIUM CHLORIDE 0.9 % IV SOLN
Freq: Once | INTRAVENOUS | Status: AC
  Administered 2017-12-12: 09:00:00 via INTRAVENOUS
  Filled 2017-12-12: qty 250

## 2017-12-12 MED ORDER — SODIUM CHLORIDE 0.9 % IV SOLN
320.0000 mg | Freq: Once | INTRAVENOUS | Status: AC
  Administered 2017-12-12: 320 mg via INTRAVENOUS
  Filled 2017-12-12: qty 32

## 2017-12-12 MED ORDER — SODIUM CHLORIDE 0.9 % IV SOLN
Freq: Once | INTRAVENOUS | Status: AC
  Administered 2017-12-12: 10:00:00 via INTRAVENOUS
  Filled 2017-12-12: qty 5

## 2017-12-12 MED ORDER — PALONOSETRON HCL INJECTION 0.25 MG/5ML
0.2500 mg | Freq: Once | INTRAVENOUS | Status: AC
  Administered 2017-12-12: 0.25 mg via INTRAVENOUS
  Filled 2017-12-12: qty 5

## 2017-12-12 MED ORDER — HEPARIN SOD (PORK) LOCK FLUSH 100 UNIT/ML IV SOLN
500.0000 [IU] | Freq: Once | INTRAVENOUS | Status: AC
  Administered 2017-12-12: 500 [IU] via INTRAVENOUS
  Filled 2017-12-12: qty 5

## 2017-12-12 MED ORDER — SODIUM CHLORIDE 0.9 % IV SOLN
800.0000 mg | Freq: Once | INTRAVENOUS | Status: AC
  Administered 2017-12-12: 800 mg via INTRAVENOUS
  Filled 2017-12-12: qty 20

## 2017-12-12 MED ORDER — SODIUM CHLORIDE 0.9% FLUSH
10.0000 mL | INTRAVENOUS | Status: DC | PRN
  Administered 2017-12-12: 10 mL via INTRAVENOUS
  Filled 2017-12-12: qty 10

## 2017-12-12 MED ORDER — SODIUM CHLORIDE 0.9 % IV SOLN
200.0000 mg | Freq: Once | INTRAVENOUS | Status: AC
  Administered 2017-12-12: 200 mg via INTRAVENOUS
  Filled 2017-12-12: qty 8

## 2017-12-12 NOTE — Progress Notes (Signed)
Patient here for follow up. Every once in a while she get a little pain (throb) on there right flank. She states it only happens once in while. Patient states feeling better with Paxil.

## 2017-12-12 NOTE — Progress Notes (Signed)
Hematology/Oncology follow up note [facility] Telephone:([phone] Fax:(336) [phone]   Patient Care Team: McLean-Scocuzza, Nino Glow, MD as PCP - General (Internal Medicine) Wellington Hampshire, MD as Consulting Physician (Cardiology) Telford Nab, RN as Registered Nurse  REFERRING PROVIDER: [place] VISIT  follow-up for assessment after chemotherapy treatment for stage IV lung adenocarcinoma. HISTORY OF PRESENTING ILLNESS:  Tamara Burton is a  84 y.o.  female with PMH listed below who was referred to me for evaluation of lung cancer.  Patient reports a remote history of lung cancer in 2003 and status post left lower lobectomy.  We obtained medical  records from [facility] in [place].  Reviewed pathology results.  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  Perihilar lymph node negative.  Peri-bronchial lymph node negative. Patient is accompanied by her friend who is her house health power of attorney Patient has noticed mild chronic left side chest wall pain which she described as a stitch, for about 6 months to a year.  Pain is very mild and she takes Tylenol or tramadol as needed.  Pain got little worse early this year. 07/15/2017 CT chest with contrast showed new left pleural process since prior CT from 2016.  Findings most suspicious for pleural tumor with complex pleural fluid.  Medial left upper lobe atelectasis without obvious tumor. 08/16/2017 PET scan showed multifocal left side pleural hypermetabolic is him and a soft tissue thickening, consistent with pleural metastasis Hyper metabolic soft tissue density within the lingula, suspicious for primary bronchogenic carcinoma.   There is also left adrenal hypermetabolic them is nonspecific and without CT correlate.  Otherwise no evidence of extra thoracic metastatic  disease.  Right nephrolithiasis.  # 08/26/2017 CT biopsy of left lower chest pleura showed positive for adenocarcinoma.   Patient's case was discussed on tumor board on Sep 01, 2017.  Per pathology, there is no enough tissue for foundation one testing or Omniseq.  Patient was referred by pulmonology Dr. Shawna Orleans to me to discuss about management plan.  INTERVAL HISTORY Tamara Burton is a 84 y.o. female who has above history reviewed by me today present for assessment prior to prior to cycle 4 chemotherapy and immunotherapy. Patient reports feeling well today.  #Chemotherapy/immunotherapy, she tolerates well.  With manageable nausea.  No fever or chills.Marland Kitchen #Anxiety, patient use Xanax 0.25 mg twice daily as needed for anxiety as well as Paxil.  She reports that anxiety is well controlled with current regimen. #Fatigue, stable usually feels tired after the chemotherapy and energy levels improved gradually to baseline.    #Left lower anterior chest wall pain, improved.  She has minimal achiness Continues to have a good performance status, she walks for exercise every day.  Review of Systems  Constitutional: Positive for malaise/fatigue. Negative for chills, fever and weight loss.  HENT: Negative for congestion, ear discharge, ear pain, nosebleeds, sinus pain and sore throat.   Eyes: Negative for double vision, photophobia, pain, discharge and redness.  Respiratory: Negative for cough, hemoptysis, sputum production, shortness of breath and wheezing.   Cardiovascular: Negative for chest pain, palpitations, orthopnea, claudication and leg swelling.  Gastrointestinal: Negative for abdominal pain, blood in stool, constipation, diarrhea, heartburn, melena, nausea and vomiting.  Genitourinary: Negative for dysuria, flank pain, frequency and hematuria.  Musculoskeletal: Negative for back pain, myalgias and neck pain.       Intermittent mild left lower anterior  chest wall pain.  Skin: Negative for  itching and rash.  Neurological: Negative for dizziness, tingling, tremors, focal weakness, weakness and headaches.  Endo/Heme/Allergies: Negative for environmental allergies. Does not bruise/bleed easily.  Psychiatric/Behavioral: Negative for depression and hallucinations. The patient is not nervous/anxious.     MEDICAL HISTORY:  Past Medical History:  Diagnosis Date  . Arthritis    "qwhere"  . Carotid artery disease ([place])   . Chronic lower back pain   . Colon polyps    benign per pt   . Epistaxis    with use of nasal sprays   . Headache    "lately; related to HTN" (04/24/2014)  . Hyperlipidemia   . Hypertension   . Hyponatremia    h/o  . Kidney stone   . Lung cancer ([place]) 2003   s/p left lower lobe resection  . Non-obstructive CAD    a. 04/2014 Cath: LM nl, LAD/D1/LCX/RCA min irregs, RPDA/PAV nl.  . Orthostatic hypotension   . OSA on CPAP    "wear it intermittently"  . PONV (postoperative nausea and vomiting)    "when I was younger"  . Renal artery stenosis ([place])    a. 04/2014 Renal angiography: LRA 95p, RRA 40ost. PTA of LRA deferred 2/2 guide cath induced Ao dissection;  01/27 6 x 15 mm Herculink stent to the Left-RA  follows with [facility] Dr. Ronalee Belts  . Sinus headache     SURGICAL HISTORY: Past Surgical History:  Procedure Laterality Date  . APPENDECTOMY    . BILATERAL OOPHORECTOMY Bilateral 2000's  . CARDIAC CATHETERIZATION  04/24/2014  . CATARACT EXTRACTION W/ INTRAOCULAR LENS  IMPLANT, BILATERAL Bilateral   . JOINT REPLACEMENT    . LEFT HEART CATHETERIZATION WITH CORONARY ANGIOGRAM N/A 04/24/2014   Procedure: LEFT HEART CATHETERIZATION WITH CORONARY ANGIOGRAM;  Surgeon: Wellington Hampshire, MD;  Location: [place] [facility] CATH [facility];  Service: Cardiovascular;  Laterality: N/A;  . LUNG LOBECTOMY Left 2003   "lower lobe"  . PERIPHERAL VASCULAR CATHETERIZATION  05/01/2014   Procedure: RENAL INTERVENTION;  Surgeon: Wellington Hampshire, MD; 6 x 15 mm Herculink stent to the Left Renal Artery  .  PORTA CATH INSERTION N/A 09/26/2017   Procedure: PORTA CATH INSERTION;  Surgeon: Algernon Huxley, MD;  Location: [facility] CV LAB;  Service: Cardiovascular;  Laterality: N/A;  . RENAL ANGIOGRAM  04/24/2014  . RENAL ANGIOGRAM N/A 04/24/2014   Procedure: RENAL ANGIOGRAM;  Surgeon: Wellington Hampshire, MD;  Location: [place] CATH LAB;  Service: Cardiovascular;  Laterality: N/A;  . RENAL ANGIOGRAM Left 05/01/2014   Procedure: RENAL ANGIOGRAM;  Surgeon: Wellington Hampshire, MD;  Location: [place] CATH LAB;  Service: Cardiovascular;  Laterality: Left;  . REPLACEMENT TOTAL KNEE Left   . TOTAL HIP ARTHROPLASTY Right   . TUBAL LIGATION  ~ 1975  . VAGINAL DELIVERY  X 2  . VAGINAL HYSTERECTOMY  1980's    SOCIAL HISTORY: Social History   Socioeconomic History  . Marital status: Widowed    Spouse name: Not on file  . Number of children: Not on file  . Years of education: Not on file  . Highest education level: Not on file  Occupational History  . Not on file  Social Needs  . Financial resource strain: Not hard at all  . Food insecurity:    Worry: Never true    Inability: Never true  . Transportation needs:    Medical: No    Non-medical: No  Tobacco Use  . Smoking status: Never Smoker  .  Smokeless tobacco: Never Used  . Tobacco comment: Father and husband both smokers  Substance and Sexual Activity  . Alcohol use: Yes    Comment: 04/24/2014 "glass of beer or wine q couple weeks"  . Drug use: No  . Sexual activity: Not on file  Lifestyle  . Physical activity:    Days per week: Not on file    Minutes per session: Not on file  . Stress: Not on file  Relationships  . Social connections:    Talks on phone: Not on file    Gets together: Not on file    Attends religious service: Not on file    Active member of club or organization: Not on file    Attends meetings of clubs or organizations: Not on file    Relationship status: Not on file  . Intimate partner violence:    Fear of current or ex partner:  Not on file    Emotionally abused: Not on file    Physically abused: Not on file    Forced sexual activity: Not on file  Other Topics Concern  . Not on file  Social History Narrative   Lives in [place]. Daughter nearby. Used to live in [place]   Normal ADLs as of 02/2017 and no h/o falls    Likes to walk   Lives alone     FAMILY HISTORY: Family History  Problem Relation Age of Onset  . Parkinsonism Mother   . Alzheimer's disease Mother   . Sarcoidosis Brother   . Stroke Maternal Grandmother   . Stomach cancer Paternal Grandmother   . Diabetes Son   . Diabetes Daughter     ALLERGIES:  is allergic to atorvastatin; penicillins; sulfa antibiotics; and amlodipine.  MEDICATIONS:  Current Outpatient Medications  Medication Sig Dispense Refill  . acetaminophen (TYLENOL) 650 MG CR tablet Take 650 mg by mouth every 8 (eight) hours as needed for pain.    Marland Kitchen ALPRAZolam (XANAX) 0.25 MG tablet Take 1 tablet (0.25 mg total) by mouth 2 (two) times daily as needed for anxiety. 30 tablet 0  . Ascorbic Acid (VITAMIN C CR) 1500 MG TBCR Take 1,500 mg by mouth daily.     Marland Kitchen aspirin 81 MG tablet Take 81 mg by mouth daily.    . Biotin 2500 MCG CAPS Take 1 capsule by mouth daily.    . Cholecalciferol (VITAMIN D3) 5000 units CAPS Take 1 capsule by mouth daily.    . Coenzyme Q10 (CO Q-10) 200 MG CAPS Take 1 capsule by mouth 2 (two) times daily.    Marland Kitchen dexamethasone (DECADRON) 4 MG tablet Take 4 mg by mouth.    . docusate sodium (COLACE) 100 MG capsule Take 1 capsule (100 mg total) by mouth daily as needed for mild constipation or moderate constipation. Do not take if you have loose stools 30 capsule 2  . folic acid (FOLVITE) 1 MG tablet Take 1 tablet (1 mg total) by mouth daily. 30 tablet 3  . glucosamine-chondroitin 500-400 MG tablet Take 1 tablet by mouth 2 (two) times daily.    Marland Kitchen lidocaine-prilocaine (EMLA) cream Apply to affected area once 30 g 3  . Magnesium 500 MG CAPS Take 1 capsule by  mouth daily.    . ondansetron (ZOFRAN) 8 MG tablet Take 1 tablet (8 mg total) by mouth 2 (two) times daily. Take two times a day starting the day after chemo for 3 days. Then take two times a day PRN for nausea or  vomiting. 60 tablet 1  . PARoxetine (PAXIL) 10 MG tablet Take 1 tablet (10 mg total) by mouth daily. 30 tablet 1  . prochlorperazine (COMPAZINE) 10 MG tablet Take 1 tablet (10 mg total) by mouth every 6 (six) hours as needed (Nausea or vomiting). 30 tablet 1  . Red Yeast Rice Extract (RED YEAST RICE PO) Take 1,200 mg by mouth 2 (two) times daily.    . traMADol (ULTRAM) 50 MG tablet Take 1 tablet (50 mg total) by mouth daily as needed. For back pain/joint pain (Patient taking differently: Take 50 mg by mouth daily as needed for moderate pain. For back pain/joint pain) 90 tablet 0  . zinc gluconate 50 MG tablet Take 50 mg by mouth daily.    . carvedilol (COREG) 12.5 MG tablet Take 1 tablet (12.5 mg total) by mouth 2 (two) times daily with a meal. (Patient not taking: Reported on 12/12/2017) 180 tablet 3  . ezetimibe (ZETIA) 10 MG tablet Take 1 tablet (10 mg total) by mouth daily. (Patient not taking: Reported on 12/12/2017) 90 tablet 3  . hydrALAZINE (APRESOLINE) 25 MG tablet Take 1 tablet (25 mg total) by mouth 3 (three) times daily. (Patient not taking: Reported on 12/12/2017) 90 tablet 6  . Lactobacillus (ACIDOPHILUS PROBIOTIC) 100 MG CAPS Take 100 mg by mouth daily.    Marland Kitchen losartan-hydrochlorothiazide (HYZAAR) 100-25 MG tablet Take 1 tablet by mouth daily. (Patient not taking: Reported on 12/12/2017) 90 tablet 0   No current facility-administered medications for this visit.    Facility-Administered Medications Ordered in Other Visits  Medication Dose Route Frequency Provider Last Rate Last Dose  . sodium chloride flush (NS) 0.9 % injection 10 mL  10 mL Intravenous PRN Earlie Server, MD   10 mL at 12/12/17 0812     PHYSICAL EXAMINATION: ECOG PERFORMANCE STATUS: 0 - Asymptomatic Vitals:   12/12/17  0843  BP: (!) 142/72  Pulse: 80  Resp: 18  Temp: (!) 97.2 F (36.2 C)  SpO2: 96%   Filed Weights   12/12/17 0843  Weight: 120 lb 8 oz (54.7 kg)    Physical Exam  Constitutional: She is oriented to person, place, and time. She appears well-developed and well-nourished. No distress.  HENT:  Head: Normocephalic and atraumatic.  Right Ear: External ear normal.  Left Ear: External ear normal.  Mouth/Throat: Oropharynx is clear and moist.  Eyes: Pupils are equal, round, and reactive to light. Conjunctivae and EOM are normal. No scleral icterus.  Neck: Normal range of motion. Neck supple.  Cardiovascular: Normal rate, regular rhythm and normal heart sounds.  Pulmonary/Chest: Effort normal and breath sounds normal. No respiratory distress. She has no wheezes. She has no rales. She exhibits no tenderness.  Decreased breath sounds on the left lower lung base  Abdominal: Soft. Bowel sounds are normal. She exhibits no distension and no mass. There is no tenderness.  Musculoskeletal: Normal range of motion. She exhibits no edema or deformity.  Lymphadenopathy:    She has no cervical adenopathy.  Neurological: She is alert and oriented to person, place, and time. No cranial nerve deficit. Coordination normal.  Skin: Skin is warm and dry. No rash noted. No erythema.  Psychiatric: She has a normal mood and affect. Her behavior is normal. Thought content normal.     LABORATORY DATA:  I have reviewed the data as listed Lab Results  Component Value Date   WBC 9.3 12/12/2017   HGB 10.6 (L) 12/12/2017   HCT 31.0 (L) 12/12/2017  MCV 87.3 12/12/2017   PLT 388 12/12/2017   Recent Labs    10/31/17 0820 11/21/17 0815 12/12/17 0812  NA 136 139 137  K 3.6 4.2 4.0  CL 100 102 101  CO2 25 25 28   GLUCOSE 133* 154* 140*  BUN 24* 14 16  CREATININE 0.71 0.74 0.71  CALCIUM 9.3 9.4 9.2  GFRNONAA >60 >60 >60  GFRAA >60 >60 >60  PROT 6.5 6.4* 6.4*  ALBUMIN 3.5 3.6 3.7  AST 22 26 27   ALT 17  15 14   ALKPHOS 114 114 101  BILITOT 0.2* 0.5 0.6       ASSESSMENT & PLAN:  83 year old female who has a history of left lower lobe primary lung adenocarcinoma present for evaluation of newly diagnosed 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 1. Pleural metastasis ([place])   2. Malignant neoplasm of unspecified part of unspecified bronchus or lung ([place])   3. Anxiety   Cancer Staging Malignant neoplasm of unspecified part of unspecified bronchus or lung ([place]) Staging form: Lung, AJCC 8th Edition - Clinical stage from 09/02/2017: Stage IV (cT2a, cN0, pM1a) - Signed by Earlie Server, MD on 09/02/2017  #Stage IV left lower lobe primary lung adenocarcinoma with pleural involvement. Status post 3 cycles of carbo/Alimta/Keytruda [ based on Keynote 189 study, triplet improved ORR, PFS, and OS].  Overall she tolerates chemotherapy and immunotherapy quite well with manageable side effects. Labs reviewed and counts acceptable for today's carbo Alimta and Keytruda treatment.  PET scan was independently reviewed by me and discussed with patient and her friends.  PET scan showed notable reduced activity in the pleural metastatic disease on the left.  Reduce lingular mass activity.  There was a loculated left pleural effusion has moderately enlarged.  Currently no definite adrenal hypermetabolic activity.  Chronic findings include aortic atherosclerosis, coronary atherosclerosis, nonobstructing right nephrolithiasis.  Dense mitral calcification.  Continue continue folic acid daily, she will have B12 injection every 9 weeks will have one today. After today's cycle, she will be proceeding with Alimta and Keytruda maintenance.  #Constipation,.  Improved.  Continue Colace daily. #Anxiety, well controlled with Xanax and Paxil.    #Fatigue, secondary to chemotherapy.  Manageable.  Continue to monitor. #Baseline suppressed TSH.  T3 is pending today.  We spent sufficient time to discuss many  aspect of care, questions were answered to patient's satisfaction. Total face to face encounter time for this patient visit was 25 min. >50% of the time was  spent in counseling and coordination of care.   Return of visit: 3 weeks for maintenance chemotherapy and immunotherapy.  Earlie Server, MD, PhD Hematology Oncology [facility] at [facility] Pager- [phone] 12/12/2017

## 2017-12-13 LAB — T3: T3 TOTAL: 68 ng/dL — AB (ref 71–180)

## 2017-12-16 ENCOUNTER — Telehealth: Payer: Self-pay | Admitting: *Deleted

## 2017-12-16 NOTE — Telephone Encounter (Signed)
Pt called in to question if needed to continue taking dexamethasone day before and day after chemo treatments. Per Dr. Tasia Catchings, pt does not need to continue dex at this time. Pt made aware of MD recommendations. Nothing further needed.

## 2017-12-26 DIAGNOSIS — R69 Illness, unspecified: Secondary | ICD-10-CM | POA: Diagnosis not present

## 2018-01-02 ENCOUNTER — Inpatient Hospital Stay: Payer: Medicare HMO

## 2018-01-02 ENCOUNTER — Encounter: Payer: Self-pay | Admitting: Oncology

## 2018-01-02 ENCOUNTER — Inpatient Hospital Stay (HOSPITAL_BASED_OUTPATIENT_CLINIC_OR_DEPARTMENT_OTHER): Payer: Medicare HMO | Admitting: Oncology

## 2018-01-02 ENCOUNTER — Other Ambulatory Visit: Payer: Self-pay | Admitting: Oncology

## 2018-01-02 ENCOUNTER — Other Ambulatory Visit: Payer: Self-pay

## 2018-01-02 VITALS — BP 136/82 | HR 85 | Temp 97.2°F | Resp 18 | Wt 119.8 lb

## 2018-01-02 DIAGNOSIS — Z5112 Encounter for antineoplastic immunotherapy: Secondary | ICD-10-CM

## 2018-01-02 DIAGNOSIS — K5900 Constipation, unspecified: Secondary | ICD-10-CM

## 2018-01-02 DIAGNOSIS — Z800 Family history of malignant neoplasm of digestive organs: Secondary | ICD-10-CM

## 2018-01-02 DIAGNOSIS — C782 Secondary malignant neoplasm of pleura: Secondary | ICD-10-CM

## 2018-01-02 DIAGNOSIS — I701 Atherosclerosis of renal artery: Secondary | ICD-10-CM

## 2018-01-02 DIAGNOSIS — R079 Chest pain, unspecified: Secondary | ICD-10-CM | POA: Diagnosis not present

## 2018-01-02 DIAGNOSIS — I700 Atherosclerosis of aorta: Secondary | ICD-10-CM | POA: Diagnosis not present

## 2018-01-02 DIAGNOSIS — Z79899 Other long term (current) drug therapy: Secondary | ICD-10-CM

## 2018-01-02 DIAGNOSIS — C3492 Malignant neoplasm of unspecified part of left bronchus or lung: Secondary | ICD-10-CM

## 2018-01-02 DIAGNOSIS — J90 Pleural effusion, not elsewhere classified: Secondary | ICD-10-CM

## 2018-01-02 DIAGNOSIS — M129 Arthropathy, unspecified: Secondary | ICD-10-CM

## 2018-01-02 DIAGNOSIS — N200 Calculus of kidney: Secondary | ICD-10-CM | POA: Diagnosis not present

## 2018-01-02 DIAGNOSIS — Z7982 Long term (current) use of aspirin: Secondary | ICD-10-CM

## 2018-01-02 DIAGNOSIS — G4730 Sleep apnea, unspecified: Secondary | ICD-10-CM

## 2018-01-02 DIAGNOSIS — C3432 Malignant neoplasm of lower lobe, left bronchus or lung: Secondary | ICD-10-CM | POA: Diagnosis not present

## 2018-01-02 DIAGNOSIS — I2510 Atherosclerotic heart disease of native coronary artery without angina pectoris: Secondary | ICD-10-CM

## 2018-01-02 DIAGNOSIS — Z5111 Encounter for antineoplastic chemotherapy: Secondary | ICD-10-CM

## 2018-01-02 DIAGNOSIS — E785 Hyperlipidemia, unspecified: Secondary | ICD-10-CM

## 2018-01-02 DIAGNOSIS — F419 Anxiety disorder, unspecified: Secondary | ICD-10-CM

## 2018-01-02 DIAGNOSIS — Z86010 Personal history of colonic polyps: Secondary | ICD-10-CM

## 2018-01-02 DIAGNOSIS — C7802 Secondary malignant neoplasm of left lung: Secondary | ICD-10-CM

## 2018-01-02 DIAGNOSIS — Z87442 Personal history of urinary calculi: Secondary | ICD-10-CM

## 2018-01-02 DIAGNOSIS — R5383 Other fatigue: Secondary | ICD-10-CM

## 2018-01-02 DIAGNOSIS — C3490 Malignant neoplasm of unspecified part of unspecified bronchus or lung: Secondary | ICD-10-CM

## 2018-01-02 DIAGNOSIS — I10 Essential (primary) hypertension: Secondary | ICD-10-CM

## 2018-01-02 LAB — COMPREHENSIVE METABOLIC PANEL
ALBUMIN: 3.4 g/dL — AB (ref 3.5–5.0)
ALT: 11 U/L (ref 0–44)
AST: 24 U/L (ref 15–41)
Alkaline Phosphatase: 114 U/L (ref 38–126)
Anion gap: 7 (ref 5–15)
BUN: 13 mg/dL (ref 8–23)
CHLORIDE: 102 mmol/L (ref 98–111)
CO2: 29 mmol/L (ref 22–32)
CREATININE: 0.62 mg/dL (ref 0.44–1.00)
Calcium: 9 mg/dL (ref 8.9–10.3)
GFR calc Af Amer: >60 mL/min (ref >60)
GFR calc non Af Amer: >60 mL/min (ref >60)
GLUCOSE: 127 mg/dL — AB (ref 70–99)
Potassium: 3.8 mmol/L (ref 3.5–5.1)
SODIUM: 138 mmol/L (ref 135–145)
Total Bilirubin: 0.6 mg/dL (ref 0.3–1.2)
Total Protein: 6.2 g/dL — ABNORMAL LOW (ref 6.5–8.1)

## 2018-01-02 LAB — CBC WITH DIFFERENTIAL/PLATELET
BASOS ABS: 0 10*3/uL (ref 0–0.1)
BASOS PCT: 1 %
Eosinophils Absolute: 0 10*3/uL (ref 0–0.7)
Eosinophils Relative: 1 %
HCT: 30.6 % — ABNORMAL LOW (ref 35.0–47.0)
Hemoglobin: 10.4 g/dL — ABNORMAL LOW (ref 12.0–16.0)
Lymphocytes Relative: 19 %
Lymphs Abs: 1 10*3/uL (ref 1.0–3.6)
MCH: 29.5 pg (ref 26.0–34.0)
MCHC: 34 g/dL (ref 32.0–36.0)
MCV: 86.9 fL (ref 80.0–100.0)
Monocytes Absolute: 0.7 10*3/uL (ref 0.2–0.9)
Monocytes Relative: 13 %
NEUTROS PCT: 66 %
Neutro Abs: 3.5 10*3/uL (ref 1.4–6.5)
Platelets: 371 10*3/uL (ref 150–440)
RBC: 3.52 MIL/uL — AB (ref 3.80–5.20)
RDW: 14.8 % — ABNORMAL HIGH (ref 11.5–14.5)
WBC: 5.3 10*3/uL (ref 3.6–11.0)

## 2018-01-02 LAB — TSH: TSH: 0.875 u[IU]/mL (ref 0.350–4.500)

## 2018-01-02 MED ORDER — SODIUM CHLORIDE 0.9 % IV SOLN
200.0000 mg | Freq: Once | INTRAVENOUS | Status: AC
  Administered 2018-01-02: 200 mg via INTRAVENOUS
  Filled 2018-01-02: qty 8

## 2018-01-02 MED ORDER — SODIUM CHLORIDE 0.9 % IV SOLN
519.0000 mg/m2 | Freq: Once | INTRAVENOUS | Status: AC
  Administered 2018-01-02: 800 mg via INTRAVENOUS
  Filled 2018-01-02: qty 20

## 2018-01-02 MED ORDER — CYANOCOBALAMIN 1000 MCG/ML IJ SOLN
1000.0000 ug | Freq: Once | INTRAMUSCULAR | Status: AC
  Administered 2018-01-02: 1000 ug via INTRAMUSCULAR
  Filled 2018-01-02: qty 1

## 2018-01-02 MED ORDER — SODIUM CHLORIDE 0.9 % IV SOLN: Freq: Once | INTRAVENOUS | Status: DC

## 2018-01-02 MED ORDER — SODIUM CHLORIDE 0.9 % IV SOLN
Freq: Once | INTRAVENOUS | Status: AC
  Administered 2018-01-02: 09:00:00 via INTRAVENOUS
  Filled 2018-01-02: qty 250

## 2018-01-02 MED ORDER — HEPARIN SOD (PORK) LOCK FLUSH 100 UNIT/ML IV SOLN
500.0000 [IU] | Freq: Once | INTRAVENOUS | Status: AC | PRN
  Administered 2018-01-02: 500 [IU]
  Filled 2018-01-02: qty 5

## 2018-01-02 MED ORDER — ONDANSETRON HCL 4 MG/2ML IJ SOLN
8.0000 mg | Freq: Once | INTRAMUSCULAR | Status: AC
  Administered 2018-01-02: 8 mg via INTRAVENOUS
  Filled 2018-01-02: qty 4

## 2018-01-02 MED ORDER — DEXAMETHASONE SODIUM PHOSPHATE 10 MG/ML IJ SOLN
10.0000 mg | Freq: Once | INTRAMUSCULAR | Status: AC
  Administered 2018-01-02: 10 mg via INTRAVENOUS
  Filled 2018-01-02: qty 1

## 2018-01-02 MED ORDER — SODIUM CHLORIDE 0.9 % IV SOLN
Freq: Once | INTRAVENOUS | Status: DC
  Filled 2018-01-02: qty 250

## 2018-01-02 MED ORDER — PAROXETINE HCL 10 MG PO TABS: 10.0000 mg | ORAL_TABLET | Freq: Every day | ORAL | 1 refills | Status: DC

## 2018-01-02 NOTE — Progress Notes (Signed)
Hematology/Oncology follow up note [facility] Telephone:([phone] Fax:([phone]) [phone]   Patient Care Team: McLean-Scocuzza, Nino Glow, MD as PCP - General (Internal Medicine) Wellington Hampshire, MD as Consulting Physician (Cardiology) Telford Nab, RN as Registered Nurse  REFERRING PROVIDER: [place] VISIT  follow-up for assessment after chemotherapy treatment for stage IV lung adenocarcinoma. HISTORY OF PRESENTING ILLNESS:  Tamara Burton is a  84 y.o.  female with PMH listed below who was referred to me for evaluation of lung cancer.  Patient reports a remote history of lung cancer in 2003 and status post left lower lobectomy.  We obtained medical  records from [facility] in [place].  Reviewed pathology results.  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  Perihilar lymph node negative.  Peri-bronchial lymph node negative. Patient is accompanied by her friend who is her house health power of attorney Patient has noticed mild chronic left side chest wall pain which she described as a stitch, for about 6 months to a year.  Pain is very mild and she takes Tylenol or tramadol as needed.  Pain got little worse early this year. 07/15/2017 CT chest with contrast showed new left pleural process since prior CT from 2016.  Findings most suspicious for pleural tumor with complex pleural fluid.  Medial left upper lobe atelectasis without obvious tumor. 08/16/2017 PET scan showed multifocal left side pleural hypermetabolic is him and a soft tissue thickening, consistent with pleural metastasis Hyper metabolic soft tissue density within the lingula, suspicious for primary bronchogenic carcinoma.   There is also left adrenal hypermetabolic them is nonspecific and without CT correlate.  Otherwise no evidence of extra thoracic metastatic  disease.  Right nephrolithiasis.  # 08/26/2017 CT biopsy of left lower chest pleura showed positive for adenocarcinoma.   Patient's case was discussed on tumor board on Sep 01, 2017.  Per pathology, there is no enough tissue for foundation one testing or Omniseq.  Patient was referred by pulmonology Dr. Shawna Orleans to me to discuss about management plan.  INTERVAL HISTORY Tamara Burton is a 84 y.o. female who has above history reviewed by me today present for assessment prior to cycle 5 of maintenance Alimta and immunotherapy.  #Chemotherapy and immunotherapy, reports tolerating well.  She has very mild nausea, manageable.  No fever or chills, or diarrhea. #Anxiety, patient takes Paxil 10 mg daily, reports that her anxiety has been very well controlled since she was started on Paxil.  She does not even need to take Xanax anymore. Also reports that blood pressure has been normalized without even need to take blood pressure medication.  Denies any drowsiness, suicidal thoughts.  Requests refills. #Fatigue, stable.  She usually feels extremely tired for about a week and a half after the chemotherapy.  And her energy levels improved gradually to baseline after that. #Left lower anterior chest wall pain.  Improved.  Minimal achiness. Continue to be quite active.  She walks for exercise every day.  Review of Systems  Constitutional: Positive for malaise/fatigue. Negative for chills, fever and weight loss.  HENT: Negative for congestion, ear discharge, ear pain, nosebleeds, sinus pain and sore throat.   Eyes: Negative for double vision, photophobia, pain, discharge and redness.  Respiratory: Negative for cough, hemoptysis, sputum production, shortness of breath and wheezing.   Cardiovascular: Negative for chest pain, palpitations, orthopnea, claudication and leg swelling.  Gastrointestinal: Negative for abdominal pain,  blood in stool, constipation, diarrhea, heartburn, melena, nausea and vomiting.    Genitourinary: Negative for dysuria, flank pain, frequency and hematuria.  Musculoskeletal: Negative for back pain, myalgias and neck pain.  Skin: Negative for itching and rash.  Neurological: Negative for dizziness, tingling, tremors, focal weakness, weakness and headaches.  Endo/Heme/Allergies: Negative for environmental allergies. Does not bruise/bleed easily.  Psychiatric/Behavioral: Negative for depression and hallucinations. The patient is not nervous/anxious.     MEDICAL HISTORY:  Past Medical History:  Diagnosis Date  . Arthritis    "qwhere"  . Carotid artery disease ([place])   . Chronic lower back pain   . Colon polyps    benign per pt   . Epistaxis    with use of nasal sprays   . Headache    "lately; related to HTN" (04/24/2014)  . Hyperlipidemia   . Hypertension   . Hyponatremia    h/o  . Kidney stone   . Lung cancer ([place]) 2003   s/p left lower lobe resection  . Non-obstructive CAD    a. 04/2014 Cath: LM nl, LAD/D1/LCX/RCA min irregs, RPDA/PAV nl.  . Orthostatic hypotension   . OSA on CPAP    "wear it intermittently"  . PONV (postoperative nausea and vomiting)    "when I was younger"  . Renal artery stenosis ([place])    a. 04/2014 Renal angiography: LRA 95p, RRA 40ost. PTA of LRA deferred 2/2 guide cath induced Ao dissection;  01/27 6 x 15 mm Herculink stent to the Left-RA  follows with [facility] Dr. Ronalee Belts  . Sinus headache     SURGICAL HISTORY: Past Surgical History:  Procedure Laterality Date  . APPENDECTOMY    . BILATERAL OOPHORECTOMY Bilateral 2000's  . CARDIAC CATHETERIZATION  04/24/2014  . CATARACT EXTRACTION W/ INTRAOCULAR LENS  IMPLANT, BILATERAL Bilateral   . JOINT REPLACEMENT    . LEFT HEART CATHETERIZATION WITH CORONARY ANGIOGRAM N/A 04/24/2014   Procedure: LEFT HEART CATHETERIZATION WITH CORONARY ANGIOGRAM;  Surgeon: Wellington Hampshire, MD;  Location: [place] CATH LAB;  Service: Cardiovascular;  Laterality: N/A;  . LUNG LOBECTOMY Left 2003   "lower lobe"  .  PERIPHERAL VASCULAR CATHETERIZATION  05/01/2014   Procedure: RENAL INTERVENTION;  Surgeon: Wellington Hampshire, MD; 6 x 15 mm Herculink stent to the Left Renal Artery  . PORTA CATH INSERTION N/A 09/26/2017   Procedure: PORTA CATH INSERTION;  Surgeon: Algernon Huxley, MD;  Location: [place] CV LAB;  Service: Cardiovascular;  Laterality: N/A;  . RENAL ANGIOGRAM  04/24/2014  . RENAL ANGIOGRAM N/A 04/24/2014   Procedure: RENAL ANGIOGRAM;  Surgeon: Wellington Hampshire, MD;  Location: [place] CATH LAB;  Service: Cardiovascular;  Laterality: N/A;  . RENAL ANGIOGRAM Left 05/01/2014   Procedure: RENAL ANGIOGRAM;  Surgeon: Wellington Hampshire, MD;  Location: [place] CATH LAB;  Service: Cardiovascular;  Laterality: Left;  . REPLACEMENT TOTAL KNEE Left   . TOTAL HIP ARTHROPLASTY Right   . TUBAL LIGATION  ~ 1975  . VAGINAL DELIVERY  X 2  . VAGINAL HYSTERECTOMY  1980's    SOCIAL HISTORY: Social History   Socioeconomic History  . Marital status: Widowed    Spouse name: Not on file  . Number of children: Not on file  . Years of education: Not on file  . Highest education level: Not on file  Occupational History  . Not on file  Social Needs  . Financial resource strain: Not hard at all  . Food insecurity:    Worry: Never true  Inability: Never true  . Transportation needs:    Medical: No    Non-medical: No  Tobacco Use  . Smoking status: Never Smoker  . Smokeless tobacco: Never Used  . Tobacco comment: Father and husband both smokers  Substance and Sexual Activity  . Alcohol use: Yes    Comment: 04/24/2014 "glass of beer or wine q couple weeks"  . Drug use: No  . Sexual activity: Not on file  Lifestyle  . Physical activity:    Days per week: Not on file    Minutes per session: Not on file  . Stress: Not on file  Relationships  . Social connections:    Talks on phone: Not on file    Gets together: Not on file    Attends religious service: Not on file    Active member of club or organization: Not on  file    Attends meetings of clubs or organizations: Not on file    Relationship status: Not on file  . Intimate partner violence:    Fear of current or ex partner: Not on file    Emotionally abused: Not on file    Physically abused: Not on file    Forced sexual activity: Not on file  Other Topics Concern  . Not on file  Social History Narrative   Lives in [place]. Daughter nearby. Used to live in [place]   Normal ADLs as of 02/2017 and no h/o falls    Likes to walk   Lives alone     FAMILY HISTORY: Family History  Problem Relation Age of Onset  . Parkinsonism Mother   . Alzheimer's disease Mother   . Sarcoidosis Brother   . Stroke Maternal Grandmother   . Stomach cancer Paternal Grandmother   . Diabetes Son   . Diabetes Daughter     ALLERGIES:  is allergic to atorvastatin; penicillins; sulfa antibiotics; and amlodipine.  MEDICATIONS:  Current Outpatient Medications  Medication Sig Dispense Refill  . acetaminophen (TYLENOL) 650 MG CR tablet Take 650 mg by mouth every 8 (eight) hours as needed for pain.    . Ascorbic Acid (VITAMIN C CR) 1500 MG TBCR Take 1,500 mg by mouth daily.     Marland Kitchen aspirin 81 MG tablet Take 81 mg by mouth daily.    . Biotin 2500 MCG CAPS Take 1 capsule by mouth daily.    . Cholecalciferol (VITAMIN D3) 5000 units CAPS Take 1 capsule by mouth daily.    . Coenzyme Q10 (CO Q-10) 200 MG CAPS Take 1 capsule by mouth 2 (two) times daily.    Marland Kitchen docusate sodium (COLACE) 100 MG capsule Take 1 capsule (100 mg total) by mouth daily as needed for mild constipation or moderate constipation. Do not take if you have loose stools 30 capsule 2  . folic acid (FOLVITE) 1 MG tablet Take 1 tablet (1 mg total) by mouth daily. 30 tablet 3  . glucosamine-chondroitin 500-400 MG tablet Take 1 tablet by mouth 2 (two) times daily.    . hydrALAZINE (APRESOLINE) 25 MG tablet Take 1 tablet (25 mg total) by mouth 3 (three) times daily. (Patient taking differently: Take 25 mg by  mouth 3 (three) times daily as needed. ) 90 tablet 6  . lidocaine-prilocaine (EMLA) cream Apply to affected area once 30 g 3  . Magnesium 500 MG CAPS Take 1 capsule by mouth daily.    . ondansetron (ZOFRAN) 8 MG tablet TAKE 1 TABLET BY MOUTH TWICE A DAY **START DAY  AFTER CHEMO X3DAYS THEN AS NEEDED** 21 tablet 0  . PARoxetine (PAXIL) 10 MG tablet Take 1 tablet (10 mg total) by mouth daily. 30 tablet 1  . prochlorperazine (COMPAZINE) 10 MG tablet Take 1 tablet (10 mg total) by mouth every 6 (six) hours as needed (Nausea or vomiting). 30 tablet 1  . Red Yeast Rice Extract (RED YEAST RICE PO) Take 1,200 mg by mouth 2 (two) times daily.    . traMADol (ULTRAM) 50 MG tablet Take 1 tablet (50 mg total) by mouth daily as needed. For back pain/joint pain (Patient taking differently: Take 50 mg by mouth daily as needed for moderate pain. For back pain/joint pain) 90 tablet 0  . zinc gluconate 50 MG tablet Take 50 mg by mouth daily.    . carvedilol (COREG) 12.5 MG tablet Take 1 tablet (12.5 mg total) by mouth 2 (two) times daily with a meal. (Patient not taking: Reported on 12/12/2017) 180 tablet 3  . dexamethasone (DECADRON) 4 MG tablet Take 4 mg by mouth.    . ezetimibe (ZETIA) 10 MG tablet Take 1 tablet (10 mg total) by mouth daily. (Patient not taking: Reported on 12/12/2017) 90 tablet 3  . Lactobacillus (ACIDOPHILUS PROBIOTIC) 100 MG CAPS Take 100 mg by mouth daily.    Marland Kitchen losartan-hydrochlorothiazide (HYZAAR) 100-25 MG tablet Take 1 tablet by mouth daily. (Patient not taking: Reported on 12/12/2017) 90 tablet 0   No current facility-administered medications for this visit.    Facility-Administered Medications Ordered in Other Visits  Medication Dose Route Frequency Provider Last Rate Last Dose  . 0.9 %  sodium chloride infusion   Intravenous Once Earlie Server, MD      . pembrolizumab [facility]) 200 mg in sodium chloride 0.9 % 50 mL chemo infusion  200 mg Intravenous Once Earlie Server, MD 116 mL/hr at 01/02/18 0940  200 mg at 01/02/18 0940  . PEMEtrexed (ALIMTA) 800 mg in sodium chloride 0.9 % 100 mL chemo infusion  519 mg/m2 (Treatment Plan Recorded) Intravenous Once Earlie Server, MD         PHYSICAL EXAMINATION: ECOG PERFORMANCE STATUS: 0 - Asymptomatic Vitals:   01/02/18 0831  BP: 136/82  Pulse: 85  Resp: 18  Temp: (!) 97.2 F (36.2 C)  SpO2: 97%   Filed Weights   01/02/18 0831  Weight: 119 lb 12.8 oz (54.3 kg)    Physical Exam  Constitutional: She is oriented to person, place, and time. No distress.  HENT:  Head: Normocephalic and atraumatic.  Mouth/Throat: Oropharynx is clear and moist.  Eyes: Pupils are equal, round, and reactive to light. Conjunctivae and EOM are normal. No scleral icterus.  Neck: Normal range of motion. Neck supple.  Cardiovascular: Normal rate, regular rhythm and normal heart sounds.  Pulmonary/Chest: Effort normal and breath sounds normal. No respiratory distress. She has no wheezes. She has no rales. She exhibits no tenderness.  Decreased breath sounds on the left lower lung base  Abdominal: Soft. Bowel sounds are normal. She exhibits no distension and no mass. There is no tenderness.  Musculoskeletal: Normal range of motion. She exhibits no edema or deformity.  Lymphadenopathy:    She has no cervical adenopathy.  Neurological: She is alert and oriented to person, place, and time. No cranial nerve deficit. Coordination normal.  Skin: Skin is warm and dry. No rash noted. No erythema.  Psychiatric: She has a normal mood and affect. Her behavior is normal. Thought content normal.     LABORATORY DATA:  I have  reviewed the data as listed Lab Results  Component Value Date   WBC 5.3 01/02/2018   HGB 10.4 (L) 01/02/2018   HCT 30.6 (L) 01/02/2018   MCV 86.9 01/02/2018   PLT 371 01/02/2018   Recent Labs    11/21/17 0815 12/12/17 0812 01/02/18 0801  NA 139 137 138  K 4.2 4.0 3.8  CL 102 101 102  CO2 25 28 29   GLUCOSE 154* 140* 127*  BUN 14 16 13     CREATININE 0.74 0.71 0.62  CALCIUM 9.4 9.2 9.0  GFRNONAA >60 >60 >60  GFRAA >60 >60 >60  PROT 6.4* 6.4* 6.2*  ALBUMIN 3.6 3.7 3.4*  AST 26 27 24   ALT 15 14 11   ALKPHOS 114 101 114  BILITOT 0.5 0.6 0.6   RADIOGRAPHIC STUDIES: I have personally reviewed the radiological images as listed and agreed with the findings in the report. 12/06/2017 PET scan was independently reviewed by me and discussed with patient and her friends.  PET scan showed notable reduced activity in the pleural metastatic disease on the left.  Reduce lingular mass activity.  There was a loculated left pleural effusion has moderately enlarged.  Currently no definite adrenal hypermetabolic activity.  Chronic findings include aortic atherosclerosis, coronary atherosclerosis, nonobstructing right nephrolithiasis.  Dense mitral calcification    ASSESSMENT & PLAN:  84 year old female who has a history of left lower lobe primary lung adenocarcinoma present for evaluation of newly diagnosed 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 No diagnosis found.Cancer Staging Malignant neoplasm of unspecified part of unspecified bronchus or lung ([place]) Staging form: Lung, AJCC 8th Edition - Clinical stage from 09/02/2017: Stage IV (cT2a, cN0, pM1a) - Signed by Earlie Server, MD on 09/02/2017  #Stage IV left lower lobe primary lung adenocarcinoma with pleural involvement. Status post 4 cycles of carbo/Alimta/Keytruda[ based on Keynote 189 study, triplet improved ORR, PFS, and OS].  Overall she tolerates chemotherapy and immunotherapy quite well with manageable side effects. Interval PET scan showed treatment response Labs are reviewed and discussed with patient.  Counts acceptable we will proceed with maintenance Alimta and Keytruda treatment.  Continue continue folic acid daily, she will have B12 injection every 2 months. Proceed with B12 injection today.   #Constipation,.  Improved.  Continue Colace daily. #Anxiety, well  controlled with low-dose Paxil 10 mg daily.  Refill sent to pharmacy.  #Fatigue, secondary to chemotherapy.  Manageable.  Continue to monitor. #Baseline suppressed TSH, borderline low T3.  Most likely thyroiditis.  Continue monitor.  We spent sufficient time to discuss many aspect of care, questions were answered to patient's satisfaction. Total face to face encounter time for this patient visit was 2min. >50% of the time was  spent in counseling and coordination of care.   Return of visit: 3 weeks for maintenance chemotherapy and immunotherapy.  Earlie Server, MD, PhD Hematology Oncology [facility] at [facility] Pager- [phone] 01/02/2018

## 2018-01-02 NOTE — Progress Notes (Signed)
Patient here for follow up. Patient states she is running low on paxil and would like refill.

## 2018-01-05 DIAGNOSIS — R69 Illness, unspecified: Secondary | ICD-10-CM | POA: Diagnosis not present

## 2018-01-12 ENCOUNTER — Ambulatory Visit: Payer: Medicare HMO | Admitting: Cardiovascular Disease

## 2018-01-19 ENCOUNTER — Other Ambulatory Visit: Payer: Self-pay | Admitting: Oncology

## 2018-01-19 ENCOUNTER — Ambulatory Visit (INDEPENDENT_AMBULATORY_CARE_PROVIDER_SITE_OTHER): Payer: Medicare HMO | Admitting: Cardiovascular Disease

## 2018-01-19 ENCOUNTER — Encounter: Payer: Self-pay | Admitting: Cardiovascular Disease

## 2018-01-19 VITALS — BP 160/82 | HR 94 | Ht <= 58 in | Wt 121.0 lb

## 2018-01-19 DIAGNOSIS — E785 Hyperlipidemia, unspecified: Secondary | ICD-10-CM | POA: Diagnosis not present

## 2018-01-19 DIAGNOSIS — I10 Essential (primary) hypertension: Secondary | ICD-10-CM

## 2018-01-19 DIAGNOSIS — I739 Peripheral vascular disease, unspecified: Secondary | ICD-10-CM | POA: Diagnosis not present

## 2018-01-19 DIAGNOSIS — I779 Disorder of arteries and arterioles, unspecified: Secondary | ICD-10-CM

## 2018-01-19 DIAGNOSIS — I701 Atherosclerosis of renal artery: Secondary | ICD-10-CM | POA: Diagnosis not present

## 2018-01-19 NOTE — Progress Notes (Signed)
Cardiology Office Note   Date:  01/19/2018   ID:  [name], DOB May 10, 1933, MRN [id number]  PCP:  McLean-Scocuzza, Nino Glow, MD  Cardiologist:   Kathlyn Sacramento, MD   Chief Complaint  Patient presents with  . OTHER    6 month f/u d/c losartan/hctz, carvedilol and zetia due to complications w/chemo. Meds reviewed verbally with pt.      History of Present Illness: Tamara Burton is a 84 y.o. female who presents for a follow-up visit regarding hypertension and renal artery stenosis status post left renal artery stenting in 04/2014 . Cardiac cath was done in January of 2016 due to chest pain and shortness of breath showed no significant coronary artery disease with normal left ventricular end-diastolic pressure. She has carotid disease followed by [facility]. She has known history of labile hypertension which typically correlates with anxiety.   She was diagnosed with stage IV adenocarcinoma of the lung in April of this year and currently getting chemotherapy and immunotherapy.  She has tolerated this very well.  She stopped all antihypertensive medications because her blood pressure was dropping.  She was started on Paxil for anxiety with overall improvement in blood pressure.  She denies any chest pain or shortness of breath.  Past Medical History:  Diagnosis Date  . Arthritis    "qwhere"  . Carotid artery disease ([place])   . Chronic lower back pain   . Colon polyps    benign per pt   . Epistaxis    with use of nasal sprays   . Headache    "lately; related to HTN" (04/24/2014)  . Hyperlipidemia   . Hypertension   . Hyponatremia    h/o  . Kidney stone   . Lung cancer ([place]) 2003   s/p left lower lobe resection  . Non-obstructive CAD    a. 04/2014 Cath: LM nl, LAD/D1/LCX/RCA min irregs, RPDA/PAV nl.  . Orthostatic hypotension   . OSA on CPAP    "wear it intermittently"  . PONV (postoperative nausea and vomiting)    "when I was younger"  . Renal artery stenosis  ([place])    a. 04/2014 Renal angiography: LRA 95p, RRA 40ost. PTA of LRA deferred 2/2 guide cath induced Ao dissection;  01/27 6 x 15 mm Herculink stent to the Left-RA  follows with [facility] Dr. Ronalee Belts  . Sinus headache     Past Surgical History:  Procedure Laterality Date  . APPENDECTOMY    . BILATERAL OOPHORECTOMY Bilateral 2000's  . CARDIAC CATHETERIZATION  04/24/2014  . CATARACT EXTRACTION W/ INTRAOCULAR LENS  IMPLANT, BILATERAL Bilateral   . JOINT REPLACEMENT    . LEFT HEART CATHETERIZATION WITH CORONARY ANGIOGRAM N/A 04/24/2014   Procedure: LEFT HEART CATHETERIZATION WITH CORONARY ANGIOGRAM;  Surgeon: Wellington Hampshire, MD;  Location: [facility]ATH [facility];  Service: Cardiovascular;  Laterality: N/A;  . LUNG LOBECTOMY Left 2003   "lower lobe"  . PERIPHERAL VASCULAR CATHETERIZATION  05/01/2014   Procedure: RENAL INTERVENTION;  Surgeon: Wellington Hampshire, MD; 6 x 15 mm Herculink stent to the Left Renal Artery  . PORTA CATH INSERTION N/A 09/26/2017   Procedure: PORTA CATH INSERTION;  Surgeon: Algernon Huxley, MD;  Location: [facility];  Service: Cardiovascular;  Laterality: N/A;  . RENAL ANGIOGRAM  04/24/2014  . RENAL ANGIOGRAM N/A 04/24/2014   Procedure: RENAL ANGIOGRAM;  Surgeon: Wellington Hampshire, MD;  Location: [facility]ATH [facility];  Service: Cardiovascular;  Laterality: N/A;  . RENAL ANGIOGRAM Left 05/01/2014  Procedure: RENAL ANGIOGRAM;  Surgeon: Wellington Hampshire, MD;  Location: [facility] CATH LAB;  Service: Cardiovascular;  Laterality: Left;  . REPLACEMENT TOTAL KNEE Left   . TOTAL HIP ARTHROPLASTY Right   . TUBAL LIGATION  ~ 1975  . VAGINAL DELIVERY  X 2  . VAGINAL HYSTERECTOMY  1980's     Current Outpatient Medications  Medication Sig Dispense Refill  . Ascorbic Acid (VITAMIN C CR) 1500 MG TBCR Take 1,500 mg by mouth daily.     Marland Kitchen aspirin 81 MG tablet Take 81 mg by mouth daily.    . Biotin 2500 MCG CAPS Take 1 capsule by mouth daily.    . Cholecalciferol (VITAMIN D3) 5000 units CAPS Take 1 capsule by  mouth daily.    . Coenzyme Q10 (CO Q-10) 200 MG CAPS Take 1 capsule by mouth 2 (two) times daily.    Marland Kitchen docusate sodium (COLACE) 100 MG capsule Take 1 capsule (100 mg total) by mouth daily as needed for mild constipation or moderate constipation. Do not take if you have loose stools 30 capsule 2  . folic acid (FOLVITE) 1 MG tablet Take 1 tablet (1 mg total) by mouth daily. 30 tablet 3  . glucosamine-chondroitin 500-400 MG tablet Take 1 tablet by mouth 2 (two) times daily.    . hydrALAZINE (APRESOLINE) 25 MG tablet Take 1 tablet (25 mg total) by mouth 3 (three) times daily. (Patient taking differently: Take 25 mg by mouth 3 (three) times daily as needed. ) 90 tablet 6  . lidocaine-prilocaine (EMLA) cream Apply to affected area once 30 g 3  . Magnesium 500 MG CAPS Take 1 capsule by mouth daily.    . ondansetron (ZOFRAN) 8 MG tablet TAKE 1 TABLET BY MOUTH TWICE A DAY **START DAY AFTER CHEMO X3DAYS THEN AS NEEDED** 21 tablet 0  . PARoxetine (PAXIL) 10 MG tablet Take 1 tablet (10 mg total) by mouth daily. 30 tablet 1  . prochlorperazine (COMPAZINE) 10 MG tablet Take 1 tablet (10 mg total) by mouth every 6 (six) hours as needed (Nausea or vomiting). 30 tablet 1  . Red Yeast Rice Extract (RED YEAST RICE PO) Take 1,200 mg by mouth 2 (two) times daily.    . traMADol (ULTRAM) 50 MG tablet Take 1 tablet (50 mg total) by mouth daily as needed. For back pain/joint pain (Patient taking differently: Take 50 mg by mouth daily as needed for moderate pain. For back pain/joint pain) 90 tablet 0  . zinc gluconate 50 MG tablet Take 50 mg by mouth daily.     No current facility-administered medications for this visit.     Allergies:   Atorvastatin; Penicillins; Sulfa antibiotics; and Amlodipine    Social History:  The patient  reports that she has never smoked. She has never used smokeless tobacco. She reports that she drinks alcohol. She reports that she does not use drugs.   Family History:  The patient's family  history includes Alzheimer's disease in her mother; Diabetes in her daughter and son; Parkinsonism in her mother; Sarcoidosis in her brother; Stomach cancer in her paternal grandmother; Stroke in her maternal grandmother.    ROS:  Please see the history of present illness.   Otherwise, review of systems are positive for none.   All other systems are reviewed and negative.    PHYSICAL EXAM: VS:  BP (!) 160/82 (BP Location: Left Arm, Patient Position: Sitting, Cuff Size: Normal)   Pulse 94   Ht 4' 3.25" (1.302 m)  Wt 121 lb (54.9 kg)   LMP  (LMP Unknown)   BMI 32.39 kg/m  , BMI Body mass index is 32.39 kg/m. GEN: Well nourished, well developed, in no acute distress  HEENT: normal  Neck: no JVD, carotid bruits, or masses Cardiac: RRR; no murmurs, rubs, or gallops,no edema  Respiratory:  clear to auscultation bilaterally, normal work of breathing GI: soft, nontender, nondistended, + BS MS: no deformity or atrophy  Skin: warm and dry, no rash Neuro:  Strength and sensation are intact Psych: euthymic mood, full affect. She appears to be anxious.   EKG:  EKG is  ordered today. EKG showed normal sinus rhythm .  No significant ST or T wave changes.   Recent Labs: 01/02/2018: ALT 11; BUN 13; Creatinine, Ser 0.62; Hemoglobin 10.4; Platelets 371; Potassium 3.8; Sodium 138; TSH 0.875    Lipid Panel    Component Value Date/Time   CHOL 239 (H) 03/11/2017 1010   CHOL 177 04/03/2014 0400   TRIG 94.0 03/11/2017 1010   TRIG 138 04/03/2014 0400   HDL 62.00 03/11/2017 1010   HDL 62 (H) 04/03/2014 0400   CHOLHDL 4 03/11/2017 1010   VLDL 18.8 03/11/2017 1010   VLDL 28 04/03/2014 0400   LDLCALC 158 (H) 03/11/2017 1010   LDLCALC 87 04/03/2014 0400   LDLDIRECT 201.7 02/05/2013 1050      Wt Readings from Last 3 Encounters:  01/19/18 121 lb (54.9 kg)  01/02/18 119 lb 12.8 oz (54.3 kg)  12/12/17 120 lb 8 oz (54.7 kg)        ASSESSMENT AND PLAN:  1.   Status post Left renal artery  stent placement for renovascular hypertension: She is scheduled for repeat renal artery duplex next month.  2. Essential hypertension: Blood pressure is mildly elevated today but overall has been reasonably controlled without any medications.  She does not want to resume any antihypertensive medications as she was feeling extremely fatigued with frequent drop in blood pressure.  3.  Moderate bilateral carotid disease: This has been stable on duplex and followed by [facility]  3. Hyperlipidemia:  She is intolerant to statins and Zetia.   She might benefit from a PCSK9 inhibitor.  However, given her age and recent diagnosis of lung cancer, I am not sure this would make a difference in overall long-term outcome.  Disposition:   FU with me in 6  months  Signed,  Kathlyn Sacramento, MD  01/19/2018 10:29 AM    [place]

## 2018-01-19 NOTE — Patient Instructions (Signed)
Medication Instructions:  No changes  If you need a refill on your cardiac medications before your next appointment, please call your pharmacy.   Lab work: None ordered  Testing/Procedures: None ordered today. Please keep your appointment on 11/15 for the Renal Duplex  Follow-Up: At [facility], you and your health needs are our priority.  As part of our continuing mission to provide you with exceptional heart care, we have created designated Provider Care Teams.  These Care Teams include your primary Cardiologist (physician) and Advanced Practice Providers (APPs -  Physician Assistants and Nurse Practitioners) who all work together to provide you with the care you need, when you need it. You will need a follow up appointment in 6 months.  Please call our office 2 months in advance to schedule this appointment.  You may see Dr. Fletcher Anon or one of the following Advanced Practice Providers on your designated Care Team:   Murray Hodgkins, NP Christell Faith, PA-C . Marrianne Mood, PA-C

## 2018-01-23 ENCOUNTER — Encounter: Payer: Self-pay | Admitting: Oncology

## 2018-01-23 ENCOUNTER — Encounter: Payer: Self-pay | Admitting: *Deleted

## 2018-01-23 ENCOUNTER — Inpatient Hospital Stay (HOSPITAL_BASED_OUTPATIENT_CLINIC_OR_DEPARTMENT_OTHER): Payer: Medicare HMO | Admitting: Oncology

## 2018-01-23 ENCOUNTER — Other Ambulatory Visit: Payer: Self-pay

## 2018-01-23 ENCOUNTER — Inpatient Hospital Stay: Payer: Medicare HMO | Attending: Oncology

## 2018-01-23 ENCOUNTER — Inpatient Hospital Stay: Payer: Medicare HMO

## 2018-01-23 ENCOUNTER — Other Ambulatory Visit: Payer: Self-pay | Admitting: Oncology

## 2018-01-23 VITALS — BP 160/80 | HR 91 | Temp 97.4°F | Resp 12 | Ht <= 58 in | Wt 121.2 lb

## 2018-01-23 DIAGNOSIS — G4733 Obstructive sleep apnea (adult) (pediatric): Secondary | ICD-10-CM | POA: Diagnosis not present

## 2018-01-23 DIAGNOSIS — Z7982 Long term (current) use of aspirin: Secondary | ICD-10-CM | POA: Insufficient documentation

## 2018-01-23 DIAGNOSIS — E785 Hyperlipidemia, unspecified: Secondary | ICD-10-CM | POA: Insufficient documentation

## 2018-01-23 DIAGNOSIS — D630 Anemia in neoplastic disease: Secondary | ICD-10-CM | POA: Insufficient documentation

## 2018-01-23 DIAGNOSIS — R5383 Other fatigue: Secondary | ICD-10-CM | POA: Insufficient documentation

## 2018-01-23 DIAGNOSIS — M129 Arthropathy, unspecified: Secondary | ICD-10-CM | POA: Insufficient documentation

## 2018-01-23 DIAGNOSIS — C782 Secondary malignant neoplasm of pleura: Secondary | ICD-10-CM | POA: Insufficient documentation

## 2018-01-23 DIAGNOSIS — I701 Atherosclerosis of renal artery: Secondary | ICD-10-CM | POA: Insufficient documentation

## 2018-01-23 DIAGNOSIS — I10 Essential (primary) hypertension: Secondary | ICD-10-CM | POA: Diagnosis not present

## 2018-01-23 DIAGNOSIS — Z5111 Encounter for antineoplastic chemotherapy: Secondary | ICD-10-CM | POA: Insufficient documentation

## 2018-01-23 DIAGNOSIS — Z800 Family history of malignant neoplasm of digestive organs: Secondary | ICD-10-CM | POA: Insufficient documentation

## 2018-01-23 DIAGNOSIS — R69 Illness, unspecified: Secondary | ICD-10-CM | POA: Diagnosis not present

## 2018-01-23 DIAGNOSIS — R530 Neoplastic (malignant) related fatigue: Secondary | ICD-10-CM | POA: Diagnosis not present

## 2018-01-23 DIAGNOSIS — Z5112 Encounter for antineoplastic immunotherapy: Secondary | ICD-10-CM

## 2018-01-23 DIAGNOSIS — C3432 Malignant neoplasm of lower lobe, left bronchus or lung: Secondary | ICD-10-CM | POA: Diagnosis not present

## 2018-01-23 DIAGNOSIS — F419 Anxiety disorder, unspecified: Secondary | ICD-10-CM

## 2018-01-23 DIAGNOSIS — Z79899 Other long term (current) drug therapy: Secondary | ICD-10-CM | POA: Insufficient documentation

## 2018-01-23 DIAGNOSIS — Z87442 Personal history of urinary calculi: Secondary | ICD-10-CM | POA: Diagnosis not present

## 2018-01-23 DIAGNOSIS — C3490 Malignant neoplasm of unspecified part of unspecified bronchus or lung: Secondary | ICD-10-CM

## 2018-01-23 DIAGNOSIS — R531 Weakness: Secondary | ICD-10-CM

## 2018-01-23 DIAGNOSIS — C3492 Malignant neoplasm of unspecified part of left bronchus or lung: Secondary | ICD-10-CM

## 2018-01-23 LAB — COMPREHENSIVE METABOLIC PANEL
ALBUMIN: 3.1 g/dL — AB (ref 3.5–5.0)
ALT: 15 U/L (ref 0–44)
ANION GAP: 8 (ref 5–15)
AST: 25 U/L (ref 15–41)
Alkaline Phosphatase: 93 U/L (ref 38–126)
BUN: 14 mg/dL (ref 8–23)
CALCIUM: 9.1 mg/dL (ref 8.9–10.3)
CO2: 28 mmol/L (ref 22–32)
Chloride: 104 mmol/L (ref 98–111)
Creatinine, Ser: 0.67 mg/dL (ref 0.44–1.00)
GFR calc Af Amer: >60 mL/min (ref >60)
GFR calc non Af Amer: >60 mL/min (ref >60)
GLUCOSE: 138 mg/dL — AB (ref 70–99)
Potassium: 3.8 mmol/L (ref 3.5–5.1)
SODIUM: 140 mmol/L (ref 135–145)
TOTAL PROTEIN: 6.4 g/dL — AB (ref 6.5–8.1)
Total Bilirubin: 0.3 mg/dL (ref 0.3–1.2)

## 2018-01-23 LAB — CBC WITH DIFFERENTIAL/PLATELET
Abs Immature Granulocytes: 0.02 10*3/uL (ref 0.00–0.07)
BASOS PCT: 1 %
Basophils Absolute: 0 10*3/uL (ref 0.0–0.1)
EOS ABS: 0.1 10*3/uL (ref 0.0–0.5)
Eosinophils Relative: 1 %
HCT: 28.2 % — ABNORMAL LOW (ref 36.0–46.0)
Hemoglobin: 9.1 g/dL — ABNORMAL LOW (ref 12.0–15.0)
Immature Granulocytes: 0 %
LYMPHS ABS: 0.8 10*3/uL (ref 0.7–4.0)
Lymphocytes Relative: 13 %
MCH: 28.1 pg (ref 26.0–34.0)
MCHC: 32.3 g/dL (ref 30.0–36.0)
MCV: 87 fL (ref 80.0–100.0)
MONOS PCT: 13 %
Monocytes Absolute: 0.8 10*3/uL (ref 0.1–1.0)
NEUTROS PCT: 72 %
NRBC: 0 % (ref 0.0–0.2)
Neutro Abs: 4 10*3/uL (ref 1.7–7.7)
PLATELETS: 393 10*3/uL (ref 150–400)
RBC: 3.24 MIL/uL — ABNORMAL LOW (ref 3.87–5.11)
RDW: 14 % (ref 11.5–15.5)
WBC: 5.6 10*3/uL (ref 4.0–10.5)

## 2018-01-23 LAB — TSH: TSH: 0.997 u[IU]/mL (ref 0.350–4.500)

## 2018-01-23 MED ORDER — SODIUM CHLORIDE 0.9 % IV SOLN
448.0000 mg/m2 | Freq: Once | INTRAVENOUS | Status: AC
  Administered 2018-01-23: 700 mg via INTRAVENOUS
  Filled 2018-01-23: qty 20

## 2018-01-23 MED ORDER — HEPARIN SOD (PORK) LOCK FLUSH 100 UNIT/ML IV SOLN
500.0000 [IU] | Freq: Once | INTRAVENOUS | Status: AC | PRN
  Administered 2018-01-23: 500 [IU]
  Filled 2018-01-23: qty 5

## 2018-01-23 MED ORDER — ONDANSETRON HCL 4 MG/2ML IJ SOLN
8.0000 mg | Freq: Once | INTRAMUSCULAR | Status: AC
  Administered 2018-01-23: 8 mg via INTRAVENOUS
  Filled 2018-01-23: qty 4

## 2018-01-23 MED ORDER — DEXAMETHASONE SODIUM PHOSPHATE 10 MG/ML IJ SOLN
10.0000 mg | Freq: Once | INTRAMUSCULAR | Status: AC
  Administered 2018-01-23: 10 mg via INTRAVENOUS
  Filled 2018-01-23: qty 1

## 2018-01-23 MED ORDER — SODIUM CHLORIDE 0.9 % IV SOLN
Freq: Once | INTRAVENOUS | Status: AC
  Administered 2018-01-23: 10:00:00 via INTRAVENOUS
  Filled 2018-01-23: qty 250

## 2018-01-23 MED ORDER — SODIUM CHLORIDE 0.9 % IV SOLN: 519.0000 mg/m2 | Freq: Once | INTRAVENOUS | Status: DC

## 2018-01-23 MED ORDER — SODIUM CHLORIDE 0.9 % IV SOLN: Freq: Once | INTRAVENOUS | Status: DC

## 2018-01-23 MED ORDER — SODIUM CHLORIDE 0.9 % IV SOLN
200.0000 mg | Freq: Once | INTRAVENOUS | Status: AC
  Administered 2018-01-23: 200 mg via INTRAVENOUS
  Filled 2018-01-23: qty 8

## 2018-01-23 NOTE — Progress Notes (Signed)
[  01/23/2018 10:18 AM]  Jeneen Rinks, Teldrin:   Jena Gauss, Do you mind asking Dr. Tasia Catchings about Jacquet's Alimta. Dose is usually 800 mg; however patient BSA is a little lower than it normally is. Normally its 1.54 today its calculated at 1.41 per Epic. If we use today's BSA the dose comes out to be 705 mg which is greater than the 10% rule calculates to be a 12% difference. Does she want [place] to continue with 800 mg or reduce to 700 mg?   Per Dr Tasia Catchings; Please reduce to 700mg

## 2018-01-23 NOTE — Progress Notes (Signed)
Patient here for pretreatment check. No changes since last treatment.

## 2018-01-23 NOTE — Progress Notes (Signed)
Calculated dose is 705 mg which is the percent difference is greater than 10% of normal 800 mg dose; therefore MD Tasia Catchings would like to reduce dose to 700 mg .  Larene Beach, PharmD

## 2018-01-23 NOTE — Progress Notes (Signed)
Hematology/Oncology follow up note [facility] [phone] [phone]   Patient Care Team: McLean-Scocuzza, Nino Glow, MD as PCP - General (Internal Medicine) Wellington Hampshire, MD as Consulting Physician (Cardiology) Telford Nab, RN as Registered Nurse  REFERRING PROVIDER: [place] VISIT  follow-up for assessment after chemotherapy treatment for stage IV lung adenocarcinoma. HISTORY OF PRESENTING ILLNESS:  Tamara Burton is a  84 y.o.  female with PMH listed below who was referred to me for evaluation of lung cancer.  Patient reports a remote history of lung cancer in 2003 and status post left lower lobectomy.  We obtained medical  records from [facility] in [place].  Reviewed pathology results.  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  Perihilar lymph node negative.  Peri-bronchial lymph node negative. Patient is accompanied by her friend who is her house health power of attorney Patient has noticed mild chronic left side chest wall pain which she described as a stitch, for about 6 months to a year.  Pain is very mild and she takes Tylenol or tramadol as needed.  Pain got little worse early this year. 07/15/2017 CT chest with contrast showed new left pleural process since prior CT from 2016.  Findings most suspicious for pleural tumor with complex pleural fluid.  Medial left upper lobe atelectasis without obvious tumor. 08/16/2017 PET scan showed multifocal left side pleural hypermetabolic is him and a soft tissue thickening, consistent with pleural metastasis Hyper metabolic soft tissue density within the lingula, suspicious for primary bronchogenic carcinoma.   There is also left adrenal hypermetabolic them is nonspecific and without CT correlate.  Otherwise no evidence of extra thoracic metastatic  disease.  Right nephrolithiasis.  # 08/26/2017 CT biopsy of left lower chest pleura showed positive for adenocarcinoma.   Patient's case was discussed on tumor board on Sep 01, 2017.  Per pathology, there is no enough tissue for foundation one testing or Omniseq.  Patient was referred by pulmonology Dr. Shawna Orleans to me to discuss about management plan.  INTERVAL HISTORY Tamara Burton is a 84 y.o. female who has above history reviewed by me today presents for assessment prior to cycle 6 chemotherapy with maintenance Alimta and immunotherapy.  #Chemotherapy and immunotherapy, reports tolerating well.  She has mild nausea manageable.  No fever or chills or diarrhea. #Blood pressure, she used to take her blood pressure medication and since she started it on chemotherapy, her blood pressure has been normal so she stopped taking all her blood pressure pills.  Today's blood pressure was 160/80 and she is concerned. Eyes any headache, blurry vision.  #Anxiety, takes Paxil 10 mg daily, reports anxiety is well controlled.  Does not need to take Xanax anymore. #Fatigue: reports worsening fatigue. Chronic onset, perisistent, no aggravating or improving factors, no associated symptoms.   #Left lower anterior chest wall achiness, stable.  Reports having intermittent achiness under her left armpit, spontaneous resolve. She continues to be quite active.  Exercise every day.  #Oncology treatment Status post 4 cycles of carbo/Alimta/Keytruda[ based on Keynote 189 study, triplet improved ORR, PFS, and OS]. Starting cycle 5, patient was started on Alimta and Keytruda maintenance.  Review of Systems  Constitutional: Positive for malaise/fatigue. Negative for chills, fever and weight loss.  HENT: Negative for congestion, ear discharge, ear pain, nosebleeds, sinus pain and sore throat.   Eyes: Negative for double vision, photophobia, pain, discharge  and redness.  Respiratory: Negative for cough, hemoptysis,  sputum production, shortness of breath and wheezing.   Cardiovascular: Negative for chest pain, palpitations, orthopnea, claudication and leg swelling.  Gastrointestinal: Negative for abdominal pain, blood in stool, constipation, diarrhea, heartburn, melena, nausea and vomiting.  Genitourinary: Negative for dysuria, flank pain, frequency and hematuria.  Musculoskeletal: Negative for back pain, myalgias and neck pain.  Skin: Negative for itching and rash.  Neurological: Negative for dizziness, tingling, tremors, focal weakness, weakness and headaches.  Endo/Heme/Allergies: Negative for environmental allergies. Does not bruise/bleed easily.  Psychiatric/Behavioral: Negative for depression and hallucinations. The patient is not nervous/anxious.     MEDICAL HISTORY:  Past Medical History:  Diagnosis Date  . Arthritis    "qwhere"  . Carotid artery disease ([place])   . Chronic lower back pain   . Colon polyps    benign per pt   . Epistaxis    with use of nasal sprays   . Headache    "lately; related to HTN" (04/24/2014)  . Hyperlipidemia   . Hypertension   . Hyponatremia    h/o  . Kidney stone   . Lung cancer ([place]) 2003   s/p left lower lobe resection  . Non-obstructive CAD    a. 04/2014 Cath: LM nl, LAD/D1/LCX/RCA min irregs, RPDA/PAV nl.  . Orthostatic hypotension   . OSA on CPAP    "wear it intermittently"  . PONV (postoperative nausea and vomiting)    "when I was younger"  . Renal artery stenosis ([place])    a. 04/2014 Renal angiography: LRA 95p, RRA 40ost. PTA of LRA deferred 2/2 guide cath induced Ao dissection;  01/27 6 x 15 mm Herculink stent to the Left-RA  follows with [facility] Dr. Ronalee Belts  . Sinus headache     SURGICAL HISTORY: Past Surgical History:  Procedure Laterality Date  . APPENDECTOMY    . BILATERAL OOPHORECTOMY Bilateral 2000's  . CARDIAC CATHETERIZATION  04/24/2014  . CATARACT EXTRACTION W/ INTRAOCULAR LENS  IMPLANT, BILATERAL Bilateral   . JOINT REPLACEMENT    .  LEFT HEART CATHETERIZATION WITH CORONARY ANGIOGRAM N/A 04/24/2014   Procedure: LEFT HEART CATHETERIZATION WITH CORONARY ANGIOGRAM;  Surgeon: Wellington Hampshire, MD;  Location: [place] CATH LAB;  Service: Cardiovascular;  Laterality: N/A;  . LUNG LOBECTOMY Left 2003   "lower lobe"  . PERIPHERAL VASCULAR CATHETERIZATION  05/01/2014   Procedure: RENAL INTERVENTION;  Surgeon: Wellington Hampshire, MD; 6 x 15 mm Herculink stent to the Left Renal Artery  . PORTA CATH INSERTION N/A 09/26/2017   Procedure: PORTA CATH INSERTION;  Surgeon: Algernon Huxley, MD;  Location: [place] CV LAB;  Service: Cardiovascular;  Laterality: N/A;  . RENAL ANGIOGRAM  04/24/2014  . RENAL ANGIOGRAM N/A 04/24/2014   Procedure: RENAL ANGIOGRAM;  Surgeon: Wellington Hampshire, MD;  Location: [place] CATH LAB;  Service: Cardiovascular;  Laterality: N/A;  . RENAL ANGIOGRAM Left 05/01/2014   Procedure: RENAL ANGIOGRAM;  Surgeon: Wellington Hampshire, MD;  Location: [place] CATH LAB;  Service: Cardiovascular;  Laterality: Left;  . REPLACEMENT TOTAL KNEE Left   . TOTAL HIP ARTHROPLASTY Right   . TUBAL LIGATION  ~ 1975  . VAGINAL DELIVERY  X 2  . VAGINAL HYSTERECTOMY  1980's    SOCIAL HISTORY: Social History   Socioeconomic History  . Marital status: Widowed    Spouse name: Not on file  . Number of children: Not on file  . Years of education: Not on file  . Highest education level: Not on  file  Occupational History  . Not on file  Social Needs  . Financial resource strain: Not hard at all  . Food insecurity:    Worry: Never true    Inability: Never true  . Transportation needs:    Medical: No    Non-medical: No  Tobacco Use  . Smoking status: Never Smoker  . Smokeless tobacco: Never Used  . Tobacco comment: Father and husband both smokers  Substance and Sexual Activity  . Alcohol use: Yes    Comment: 04/24/2014 "glass of beer or wine q couple weeks"  . Drug use: No  . Sexual activity: Not on file  Lifestyle  . Physical activity:    Days  per week: Not on file    Minutes per session: Not on file  . Stress: Not on file  Relationships  . Social connections:    Talks on phone: Not on file    Gets together: Not on file    Attends religious service: Not on file    Active member of club or organization: Not on file    Attends meetings of clubs or organizations: Not on file    Relationship status: Not on file  . Intimate partner violence:    Fear of current or ex partner: Not on file    Emotionally abused: Not on file    Physically abused: Not on file    Forced sexual activity: Not on file  Other Topics Concern  . Not on file  Social History Narrative   Lives in [place]. Daughter nearby. Used to live in [place]   Normal ADLs as of 02/2017 and no h/o falls    Likes to walk   Lives alone     FAMILY HISTORY: Family History  Problem Relation Age of Onset  . Parkinsonism Mother   . Alzheimer's disease Mother   . Sarcoidosis Brother   . Stroke Maternal Grandmother   . Stomach cancer Paternal Grandmother   . Diabetes Son   . Diabetes Daughter     ALLERGIES:  is allergic to atorvastatin; penicillins; sulfa antibiotics; and amlodipine.  MEDICATIONS:  Current Outpatient Medications  Medication Sig Dispense Refill  . Ascorbic Acid (VITAMIN C CR) 1500 MG TBCR Take 1,500 mg by mouth daily.     Marland Kitchen aspirin 81 MG tablet Take 81 mg by mouth daily.    . Biotin 2500 MCG CAPS Take 1 capsule by mouth daily.    . Cholecalciferol (VITAMIN D3) 5000 units CAPS Take 1 capsule by mouth daily.    . Coenzyme Q10 (CO Q-10) 200 MG CAPS Take 1 capsule by mouth 2 (two) times daily.    Marland Kitchen docusate sodium (COLACE) 100 MG capsule Take 1 capsule (100 mg total) by mouth daily as needed for mild constipation or moderate constipation. Do not take if you have loose stools 30 capsule 2  . folic acid (FOLVITE) 1 MG tablet Take 1 tablet (1 mg total) by mouth daily. 30 tablet 3  . glucosamine-chondroitin 500-400 MG tablet Take 1 tablet by mouth  2 (two) times daily.    . hydrALAZINE (APRESOLINE) 25 MG tablet Take 1 tablet (25 mg total) by mouth 3 (three) times daily. (Patient taking differently: Take 25 mg by mouth 3 (three) times daily as needed. ) 90 tablet 6  . lidocaine-prilocaine (EMLA) cream Apply to affected area once 30 g 3  . Magnesium 500 MG CAPS Take 1 capsule by mouth daily.    . ondansetron (ZOFRAN) 8 MG  tablet TAKE 1 TABLET BY MOUTH TWICE A DAY **START DAY AFTER CHEMO X3DAYS THEN AS NEEDED** 21 tablet 0  . PARoxetine (PAXIL) 10 MG tablet Take 1 tablet (10 mg total) by mouth daily. 30 tablet 1  . prochlorperazine (COMPAZINE) 10 MG tablet Take 1 tablet (10 mg total) by mouth every 6 (six) hours as needed (Nausea or vomiting). 30 tablet 1  . Red Yeast Rice Extract (RED YEAST RICE PO) Take 1,200 mg by mouth 2 (two) times daily.    . traMADol (ULTRAM) 50 MG tablet Take 1 tablet (50 mg total) by mouth daily as needed. For back pain/joint pain (Patient taking differently: Take 50 mg by mouth daily as needed for moderate pain. For back pain/joint pain) 90 tablet 0  . zinc gluconate 50 MG tablet Take 50 mg by mouth daily.     No current facility-administered medications for this visit.      PHYSICAL EXAMINATION: ECOG PERFORMANCE STATUS: 1 - Symptomatic but completely ambulatory Vitals:   01/23/18 0845  BP: (!) 160/80  Pulse: 91  Resp: 12  Temp: (!) 97.4 F (36.3 C)   Filed Weights   01/23/18 0845  Weight: 121 lb 3.2 oz (55 kg)    Physical Exam  Constitutional: She is oriented to person, place, and time. No distress.  HENT:  Head: Normocephalic and atraumatic.  Mouth/Throat: Oropharynx is clear and moist.  Eyes: Pupils are equal, round, and reactive to light. Conjunctivae and EOM are normal. No scleral icterus.  Neck: Normal range of motion. Neck supple.  Cardiovascular: Normal rate, regular rhythm and normal heart sounds.  Pulmonary/Chest: Effort normal and breath sounds normal. No respiratory distress. She has no  wheezes. She has no rales. She exhibits no tenderness.  Decreased breath sounds on the left lower lung base  Abdominal: Soft. Bowel sounds are normal. She exhibits no distension and no mass. There is no tenderness.  Musculoskeletal: Normal range of motion. She exhibits no edema or deformity.  Lymphadenopathy:    She has no cervical adenopathy.  Neurological: She is alert and oriented to person, place, and time. No cranial nerve deficit. Coordination normal.  Skin: Skin is warm and dry. No rash noted. No erythema.  Psychiatric: She has a normal mood and affect. Her behavior is normal. Thought content normal.     LABORATORY DATA:  I have reviewed the data as listed Lab Results  Component Value Date   WBC 5.3 01/02/2018   HGB 10.4 (L) 01/02/2018   HCT 30.6 (L) 01/02/2018   MCV 86.9 01/02/2018   PLT 371 01/02/2018   Recent Labs    11/21/17 0815 12/12/17 0812 01/02/18 0801  NA 139 137 138  K 4.2 4.0 3.8  CL 102 101 102  CO2 25 28 29   GLUCOSE 154* 140* 127*  BUN 14 16 13   CREATININE 0.74 0.71 0.62  CALCIUM 9.4 9.2 9.0  GFRNONAA >60 >60 >60  GFRAA >60 >60 >60  PROT 6.4* 6.4* 6.2*  ALBUMIN 3.6 3.7 3.4*  AST 26 27 24   ALT 15 14 11   ALKPHOS 114 101 114  BILITOT 0.5 0.6 0.6   RADIOGRAPHIC STUDIES: I have personally reviewed the radiological images as listed and agreed with the findings in the report. 12/06/2017 PET scan was independently reviewed by me and discussed with patient and her friends.  PET scan showed notable reduced activity in the pleural metastatic disease on the left.  Reduce lingular mass activity.  There was a loculated left pleural effusion has  moderately enlarged.  Currently no definite adrenal hypermetabolic activity.  Chronic findings include aortic atherosclerosis, coronary atherosclerosis, nonobstructing right nephrolithiasis.  Dense mitral calcification    ASSESSMENT & PLAN:  84 year old female who has a history of left lower lobe primary lung  adenocarcinoma present for evaluation of newly diagnosed 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 1. 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 Malignant neoplasm of left lung, unspecified part of lung ([place])   3. Encounter for antineoplastic chemotherapy   4. Encounter for antineoplastic immunotherapy   5. Anemia in neoplastic disease   6. Neoplastic malignant related fatigue   Cancer Staging Malignant neoplasm of unspecified part of unspecified bronchus or lung ([place]) Staging form: Lung, AJCC 8th Edition - Clinical stage from 09/02/2017: Stage IV (cT2a, cN0, pM1a) - Signed by Earlie Server, MD on 09/02/2017  #Stage IV left lower lobe primary lung adenocarcinoma with pleural involvement. Tolerates maintenance Alimta and Keytruda well. Labs reviewed and discussed with patient.  Counts acceptable we will proceed with maintenance Alimta and Keytruda treatment today.  #Normocytic anemia, hemoglobin worsened compared to last visit.  Likely secondary to chemotherapy. At iron panel.  #Continue folic acid daily.  B12 injection was given 01/02/2018.  Proceed B12 injection at the beginning of December.   #Anxiety, well controlled with low-dose Paxil 10 mg daily.  Continue.   #Fatigue, secondary to chemotherapy and anemia. continue monitor. #Baseline suppressed TSH, borderline low T3.  TSH normalized. . Monitor.   We spent sufficient time to discuss many aspect of care, questions were answered to patient's satisfaction. Total face to face encounter time for this patient visit was 22min. >50% of the time was  spent in counseling and coordination of care.   Return of visit: 3 weeks for maintenance chemotherapy and immunotherapy.  Total face to face encounter time for this patient visit was 25  min. >50% of the time was  spent in counseling and coordination of care.  Earlie Server, MD, PhD Hematology Oncology [facility] at [facility] Pager- [phone] 01/23/2018

## 2018-01-24 ENCOUNTER — Other Ambulatory Visit: Payer: Self-pay

## 2018-01-24 DIAGNOSIS — C3492 Malignant neoplasm of unspecified part of left bronchus or lung: Secondary | ICD-10-CM

## 2018-01-24 LAB — IRON AND TIBC
Iron: 16 ug/dL — ABNORMAL LOW (ref 28–170)
SATURATION RATIOS: 7 % — AB (ref 10.4–31.8)
TIBC: 238 ug/dL — AB (ref 250–450)
UIBC: 222 ug/dL

## 2018-01-24 LAB — FERRITIN: FERRITIN: 633 ng/mL — AB (ref 11–307)

## 2018-02-02 ENCOUNTER — Ambulatory Visit (INDEPENDENT_AMBULATORY_CARE_PROVIDER_SITE_OTHER): Payer: Medicare HMO | Admitting: Vascular Surgery

## 2018-02-02 ENCOUNTER — Encounter (INDEPENDENT_AMBULATORY_CARE_PROVIDER_SITE_OTHER): Payer: Medicare HMO

## 2018-02-06 ENCOUNTER — Encounter (INDEPENDENT_AMBULATORY_CARE_PROVIDER_SITE_OTHER): Payer: Self-pay | Admitting: Vascular Surgery

## 2018-02-06 ENCOUNTER — Ambulatory Visit (INDEPENDENT_AMBULATORY_CARE_PROVIDER_SITE_OTHER): Payer: Medicare HMO

## 2018-02-06 ENCOUNTER — Ambulatory Visit (INDEPENDENT_AMBULATORY_CARE_PROVIDER_SITE_OTHER): Payer: Medicare HMO | Admitting: Vascular Surgery

## 2018-02-06 VITALS — BP 198/89 | HR 96 | Resp 16 | Ht <= 58 in | Wt 119.6 lb

## 2018-02-06 DIAGNOSIS — I6523 Occlusion and stenosis of bilateral carotid arteries: Secondary | ICD-10-CM | POA: Diagnosis not present

## 2018-02-06 DIAGNOSIS — C3490 Malignant neoplasm of unspecified part of unspecified bronchus or lung: Secondary | ICD-10-CM | POA: Diagnosis not present

## 2018-02-06 DIAGNOSIS — Z7982 Long term (current) use of aspirin: Secondary | ICD-10-CM | POA: Diagnosis not present

## 2018-02-06 DIAGNOSIS — E785 Hyperlipidemia, unspecified: Secondary | ICD-10-CM

## 2018-02-06 NOTE — Progress Notes (Signed)
MRN : [id number]  Tamara Burton is a 84 y.o. (Jul 23, 1933) female who presents with chief complaint of  Chief Complaint  Patient presents with  . Follow-up    68yr carotid ultrasound follow up  .  History of Present Illness:   The patient is seen for follow up evaluation of carotid stenosis. The carotid stenosis followed by ultrasound.   The patient denies amaurosis fugax. There is no recent history of TIA symptoms or focal motor deficits. There is no prior documented CVA.  The patient is taking enteric-coated aspirin 81 mg daily.  There is no history of migraine headaches. There is no history of seizures.  The patient has a history of coronary artery disease, no recent episodes of angina or shortness of breath. The patient denies PAD or claudication symptoms. There is a history of hyperlipidemia which is being treated with a statin.    Carotid Duplex done today shows RICA 84-53% and LICA <64%.  No change compared to last study in  02/02/2017  Current Meds  Medication Sig  . Ascorbic Acid (VITAMIN C CR) 1500 MG TBCR Take 1,500 mg by mouth daily.   Marland Kitchen aspirin 81 MG tablet Take 81 mg by mouth daily.  . Biotin 2500 MCG CAPS Take 1 capsule by mouth daily.  . Cholecalciferol (VITAMIN D3) 5000 units CAPS Take 1 capsule by mouth daily.  . Coenzyme Q10 (CO Q-10) 200 MG CAPS Take 1 capsule by mouth 2 (two) times daily.  Marland Kitchen docusate sodium (COLACE) 100 MG capsule Take 1 capsule (100 mg total) by mouth daily as needed for mild constipation or moderate constipation. Do not take if you have loose stools  . folic acid (FOLVITE) 1 MG tablet TAKE 1 TABLET BY MOUTH EVERY DAY  . glucosamine-chondroitin 500-400 MG tablet Take 1 tablet by mouth 2 (two) times daily.  . hydrALAZINE (APRESOLINE) 25 MG tablet Take 1 tablet (25 mg total) by mouth 3 (three) times daily. (Patient taking differently: Take 25 mg by mouth 3 (three) times daily as needed. )  . lidocaine-prilocaine (EMLA) cream Apply to  affected area once  . Magnesium 500 MG CAPS Take 1 capsule by mouth daily.  . ondansetron (ZOFRAN) 8 MG tablet TAKE 1 TABLET BY MOUTH TWICE A DAY **START DAY AFTER CHEMO X3DAYS THEN AS NEEDED**  . PARoxetine (PAXIL) 10 MG tablet Take 1 tablet (10 mg total) by mouth daily.  . prochlorperazine (COMPAZINE) 10 MG tablet Take 1 tablet (10 mg total) by mouth every 6 (six) hours as needed (Nausea or vomiting).  . Red Yeast Rice Extract (RED YEAST RICE PO) Take 1,200 mg by mouth 2 (two) times daily.  . traMADol (ULTRAM) 50 MG tablet Take 1 tablet (50 mg total) by mouth daily as needed. For back pain/joint pain (Patient taking differently: Take 50 mg by mouth daily as needed for moderate pain. For back pain/joint pain)  . zinc gluconate 50 MG tablet Take 50 mg by mouth daily.    Past Medical History:  Diagnosis Date  . Arthritis    "qwhere"  . Carotid artery disease ([place])   . Chronic lower back pain   . Colon polyps    benign per pt   . Epistaxis    with use of nasal sprays   . Headache    "lately; related to HTN" (04/24/2014)  . Hyperlipidemia   . Hypertension   . Hyponatremia    h/o  . Kidney stone   . Lung cancer [facility]) 2003  s/p left lower lobe resection  . Non-obstructive CAD    a. 04/2014 Cath: LM nl, LAD/D1/LCX/RCA min irregs, RPDA/PAV nl.  . Orthostatic hypotension   . OSA on CPAP    "wear it intermittently"  . PONV (postoperative nausea and vomiting)    "when I was younger"  . Renal artery stenosis ([place])    a. 04/2014 Renal angiography: LRA 95p, RRA 40ost. PTA of LRA deferred 2/2 guide cath induced Ao dissection;  01/27 6 x 15 mm Herculink stent to the Left-RA  follows with [facility] Dr. Ronalee Belts  . Sinus headache     Past Surgical History:  Procedure Laterality Date  . APPENDECTOMY    . BILATERAL OOPHORECTOMY Bilateral 2000's  . CARDIAC CATHETERIZATION  04/24/2014  . CATARACT EXTRACTION W/ INTRAOCULAR LENS  IMPLANT, BILATERAL Bilateral   . JOINT REPLACEMENT    . LEFT HEART  CATHETERIZATION WITH CORONARY ANGIOGRAM N/A 04/24/2014   Procedure: LEFT HEART CATHETERIZATION WITH CORONARY ANGIOGRAM;  Surgeon: Wellington Hampshire, MD;  Location: [place] CATH LAB;  Service: Cardiovascular;  Laterality: N/A;  . LUNG LOBECTOMY Left 2003   "lower lobe"  . PERIPHERAL VASCULAR CATHETERIZATION  05/01/2014   Procedure: RENAL INTERVENTION;  Surgeon: Wellington Hampshire, MD; 6 x 15 mm Herculink stent to the Left Renal Artery  . PORTA CATH INSERTION N/A 09/26/2017   Procedure: PORTA CATH INSERTION;  Surgeon: Algernon Huxley, MD;  Location: [place] CV LAB;  Service: Cardiovascular;  Laterality: N/A;  . RENAL ANGIOGRAM  04/24/2014  . RENAL ANGIOGRAM N/A 04/24/2014   Procedure: RENAL ANGIOGRAM;  Surgeon: Wellington Hampshire, MD;  Location: [place] CATH LAB;  Service: Cardiovascular;  Laterality: N/A;  . RENAL ANGIOGRAM Left 05/01/2014   Procedure: RENAL ANGIOGRAM;  Surgeon: Wellington Hampshire, MD;  Location: [place] CATH LAB;  Service: Cardiovascular;  Laterality: Left;  . REPLACEMENT TOTAL KNEE Left   . TOTAL HIP ARTHROPLASTY Right   . TUBAL LIGATION  ~ 1975  . VAGINAL DELIVERY  X 2  . VAGINAL HYSTERECTOMY  1980's    Social History Social History   Tobacco Use  . Smoking status: Never Smoker  . Smokeless tobacco: Never Used  . Tobacco comment: Father and husband both smokers  Substance Use Topics  . Alcohol use: Yes    Comment: 04/24/2014 "glass of beer or wine q couple weeks"  . Drug use: No    Family History Family History  Problem Relation Age of Onset  . Parkinsonism Mother   . Alzheimer's disease Mother   . Sarcoidosis Brother   . Stroke Maternal Grandmother   . Stomach cancer Paternal Grandmother   . Diabetes Son   . Diabetes Daughter     Allergies  Allergen Reactions  . Atorvastatin Other (See Comments)    Muscle weakness  . Penicillins Hives    Has patient had a PCN reaction causing immediate rash, facial/tongue/throat swelling, SOB or lightheadedness with hypotension: Yes Has  patient had a PCN reaction causing severe rash involving mucus membranes or skin necrosis: No Has patient had a PCN reaction that required hospitalization: No Has patient had a PCN reaction occurring within the last 10 years: No If all of the above answers are "NO", then may proceed with Cephalosporin use.  . Sulfa Antibiotics Hives  . Amlodipine     Weakness and confusion     REVIEW OF SYSTEMS (Negative unless checked)  Constitutional: [] Weight loss  [] Fever  [] Chills Cardiac: [] Chest pain   [] Chest pressure   [] Palpitations   []   Shortness of breath when laying flat   [x] Shortness of breath with exertion. Vascular:  [] Pain in legs with walking   [] Pain in legs at rest  [] History of DVT   [] Phlebitis   [] Swelling in legs   [] Varicose veins   [] Non-healing ulcers Pulmonary:   [] Uses home oxygen   [] Productive cough   [] Hemoptysis   [] Wheeze  [] COPD   [] Asthma Neurologic:  [] Dizziness   [] Seizures   [] History of stroke   [] History of TIA  [] Aphasia   [] Vissual changes   [] Weakness or numbness in arm   [] Weakness or numbness in leg Musculoskeletal:   [] Joint swelling   [x] Joint pain   [] Low back pain Hematologic:  [] Easy bruising  [] Easy bleeding   [] Hypercoagulable state   [] Anemic Gastrointestinal:  [] Diarrhea   [] Vomiting  [] Gastroesophageal reflux/heartburn   [] Difficulty swallowing. Genitourinary:  [] Chronic kidney disease   [] Difficult urination  [] Frequent urination   [] Blood in urine Skin:  [] Rashes   [] Ulcers  Psychological:  [] History of anxiety   []  History of major depression.  Physical Examination  Vitals:   02/06/18 1037 02/06/18 1038  BP: (!) 181/76 (!) 198/89  Pulse: 96   Resp: 16   Weight: 119 lb 9.6 oz (54.3 kg)   Height: 4' 3.5" (1.308 m)    Body mass index is 31.7 kg/m. Gen: WD/WN, NAD Head: [place]/AT, No temporalis wasting.  Ear/Nose/Throat: Hearing grossly intact, nares w/o erythema or drainage Eyes: PER, EOMI, sclera nonicteric.  Neck: Supple, no large masses.     Pulmonary:  Good air movement, no audible wheezing bilaterally, no use of accessory muscles.  Cardiac: RRR, no JVD Vascular: right carotid bruit Vessel Right Left  Radial Palpable Palpable  Brachial Palpable Palpable  Carotid Palpable Palpable  Gastrointestinal: Non-distended. No guarding/no peritoneal signs.  Musculoskeletal: M/S 5/5 throughout.  No deformity or atrophy.  Neurologic: CN 2-12 intact. Symmetrical.  Speech is fluent. Motor exam as listed above. Psychiatric: Judgment intact, Mood & affect appropriate for pt's clinical situation. Dermatologic: No rashes or ulcers noted.  No changes consistent with cellulitis. Lymph : No lichenification or skin changes of chronic lymphedema.  CBC Lab Results  Component Value Date   WBC 5.6 01/23/2018   HGB 9.1 (L) 01/23/2018   HCT 28.2 (L) 01/23/2018   MCV 87.0 01/23/2018   PLT 393 01/23/2018    BMET    Component Value Date/Time   NA 140 01/23/2018 0821   NA 143 07/15/2014 1427   NA 138 04/19/2014 1115   K 3.8 01/23/2018 0821   K 3.6 04/19/2014 1115   CL 104 01/23/2018 0821   CL 100 04/19/2014 1115   CO2 28 01/23/2018 0821   CO2 31 04/19/2014 1115   GLUCOSE 138 (H) 01/23/2018 0821   GLUCOSE 99 04/19/2014 1115   BUN 14 01/23/2018 0821   BUN 14 07/15/2014 1427   BUN 18 04/19/2014 1115   CREATININE 0.67 01/23/2018 0821   CREATININE 0.91 04/19/2014 1115   CALCIUM 9.1 01/23/2018 0821   CALCIUM 9.0 04/19/2014 1115   GFRNONAA >60 01/23/2018 0821   GFRNONAA >60 04/19/2014 1115   GFRAA >60 01/23/2018 0821   GFRAA >60 04/19/2014 1115   Estimated Creatinine Clearance: 31.9 mL/min (by C-G formula based on SCr of 0.67 mg/dL).  COAG Lab Results  Component Value Date   INR 0.89 08/26/2017   INR 0.95 05/01/2014   INR 0.9 04/19/2014    Radiology No results found.   Assessment/Plan 1. Bilateral carotid artery stenosis Recommend:  Given the patient's asymptomatic subcritical stenosis no further invasive testing or  surgery at this time.  Carotid Duplex done today shows RICA 80-99% and LICA <83%.  No change compared to last study in  02/02/2017  Continue antiplatelet therapy as prescribed Continue management of CAD, HTN and Hyperlipidemia Healthy heart diet,  encouraged exercise at least 4 times per week Follow up in 12 months with duplex ultrasound and physical exam   - VAS US CAROTID; Future  2. Hyperlipidemia, unspecified hyperlipidemia type Continue statin as ordered and reviewed, no changes at this time   3. Malignant neoplasm of unspecified part of unspecified bronchus or lung [facility]) Continue treatment as per Oncology Service   Hortencia Pilar, MD  02/06/2018 10:49 AM

## 2018-02-09 DIAGNOSIS — L578 Other skin changes due to chronic exposure to nonionizing radiation: Secondary | ICD-10-CM | POA: Diagnosis not present

## 2018-02-09 DIAGNOSIS — L820 Inflamed seborrheic keratosis: Secondary | ICD-10-CM | POA: Diagnosis not present

## 2018-02-09 DIAGNOSIS — L821 Other seborrheic keratosis: Secondary | ICD-10-CM | POA: Diagnosis not present

## 2018-02-09 DIAGNOSIS — Z1283 Encounter for screening for malignant neoplasm of skin: Secondary | ICD-10-CM | POA: Diagnosis not present

## 2018-02-09 DIAGNOSIS — R202 Paresthesia of skin: Secondary | ICD-10-CM | POA: Diagnosis not present

## 2018-02-13 ENCOUNTER — Inpatient Hospital Stay: Payer: Medicare HMO

## 2018-02-13 ENCOUNTER — Encounter: Payer: Self-pay | Admitting: Oncology

## 2018-02-13 ENCOUNTER — Other Ambulatory Visit: Payer: Self-pay

## 2018-02-13 ENCOUNTER — Inpatient Hospital Stay: Payer: Medicare HMO | Attending: Oncology

## 2018-02-13 ENCOUNTER — Inpatient Hospital Stay (HOSPITAL_BASED_OUTPATIENT_CLINIC_OR_DEPARTMENT_OTHER): Payer: Medicare HMO | Admitting: Oncology

## 2018-02-13 VITALS — BP 144/79 | HR 92 | Temp 96.7°F | Resp 18 | Wt 120.3 lb

## 2018-02-13 DIAGNOSIS — E785 Hyperlipidemia, unspecified: Secondary | ICD-10-CM | POA: Diagnosis not present

## 2018-02-13 DIAGNOSIS — Z79899 Other long term (current) drug therapy: Secondary | ICD-10-CM | POA: Insufficient documentation

## 2018-02-13 DIAGNOSIS — M129 Arthropathy, unspecified: Secondary | ICD-10-CM | POA: Diagnosis not present

## 2018-02-13 DIAGNOSIS — Z800 Family history of malignant neoplasm of digestive organs: Secondary | ICD-10-CM | POA: Diagnosis not present

## 2018-02-13 DIAGNOSIS — I10 Essential (primary) hypertension: Secondary | ICD-10-CM | POA: Insufficient documentation

## 2018-02-13 DIAGNOSIS — D6481 Anemia due to antineoplastic chemotherapy: Secondary | ICD-10-CM

## 2018-02-13 DIAGNOSIS — C3432 Malignant neoplasm of lower lobe, left bronchus or lung: Secondary | ICD-10-CM | POA: Diagnosis not present

## 2018-02-13 DIAGNOSIS — N200 Calculus of kidney: Secondary | ICD-10-CM | POA: Insufficient documentation

## 2018-02-13 DIAGNOSIS — Z86010 Personal history of colonic polyps: Secondary | ICD-10-CM | POA: Diagnosis not present

## 2018-02-13 DIAGNOSIS — Z5111 Encounter for antineoplastic chemotherapy: Secondary | ICD-10-CM | POA: Insufficient documentation

## 2018-02-13 DIAGNOSIS — T451X5A Adverse effect of antineoplastic and immunosuppressive drugs, initial encounter: Secondary | ICD-10-CM | POA: Diagnosis not present

## 2018-02-13 DIAGNOSIS — F419 Anxiety disorder, unspecified: Secondary | ICD-10-CM | POA: Insufficient documentation

## 2018-02-13 DIAGNOSIS — C3490 Malignant neoplasm of unspecified part of unspecified bronchus or lung: Secondary | ICD-10-CM

## 2018-02-13 DIAGNOSIS — G8929 Other chronic pain: Secondary | ICD-10-CM | POA: Insufficient documentation

## 2018-02-13 DIAGNOSIS — R531 Weakness: Secondary | ICD-10-CM | POA: Insufficient documentation

## 2018-02-13 DIAGNOSIS — I2510 Atherosclerotic heart disease of native coronary artery without angina pectoris: Secondary | ICD-10-CM

## 2018-02-13 DIAGNOSIS — R5383 Other fatigue: Secondary | ICD-10-CM | POA: Insufficient documentation

## 2018-02-13 DIAGNOSIS — R69 Illness, unspecified: Secondary | ICD-10-CM | POA: Diagnosis not present

## 2018-02-13 DIAGNOSIS — Z7982 Long term (current) use of aspirin: Secondary | ICD-10-CM | POA: Diagnosis not present

## 2018-02-13 DIAGNOSIS — C782 Secondary malignant neoplasm of pleura: Secondary | ICD-10-CM | POA: Diagnosis not present

## 2018-02-13 DIAGNOSIS — M545 Low back pain: Secondary | ICD-10-CM

## 2018-02-13 DIAGNOSIS — I701 Atherosclerosis of renal artery: Secondary | ICD-10-CM | POA: Insufficient documentation

## 2018-02-13 DIAGNOSIS — Z5112 Encounter for antineoplastic immunotherapy: Secondary | ICD-10-CM | POA: Diagnosis not present

## 2018-02-13 DIAGNOSIS — C3492 Malignant neoplasm of unspecified part of left bronchus or lung: Secondary | ICD-10-CM

## 2018-02-13 LAB — CBC WITH DIFFERENTIAL/PLATELET
Abs Immature Granulocytes: 0.03 10*3/uL (ref 0.00–0.07)
BASOS ABS: 0.1 10*3/uL (ref 0.0–0.1)
BASOS PCT: 1 %
Eosinophils Absolute: 0.1 10*3/uL (ref 0.0–0.5)
Eosinophils Relative: 2 %
HEMATOCRIT: 30 % — AB (ref 36.0–46.0)
Hemoglobin: 9.4 g/dL — ABNORMAL LOW (ref 12.0–15.0)
IMMATURE GRANULOCYTES: 1 %
LYMPHS ABS: 0.9 10*3/uL (ref 0.7–4.0)
Lymphocytes Relative: 14 %
MCH: 27.1 pg (ref 26.0–34.0)
MCHC: 31.3 g/dL (ref 30.0–36.0)
MCV: 86.5 fL (ref 80.0–100.0)
Monocytes Absolute: 0.9 10*3/uL (ref 0.1–1.0)
Monocytes Relative: 13 %
NEUTROS PCT: 69 %
NRBC: 0 % (ref 0.0–0.2)
Neutro Abs: 4.6 10*3/uL (ref 1.7–7.7)
PLATELETS: 394 10*3/uL (ref 150–400)
RBC: 3.47 MIL/uL — ABNORMAL LOW (ref 3.87–5.11)
RDW: 14.4 % (ref 11.5–15.5)
WBC: 6.6 10*3/uL (ref 4.0–10.5)

## 2018-02-13 LAB — COMPREHENSIVE METABOLIC PANEL
ALT: 12 U/L (ref 0–44)
ANION GAP: 9 (ref 5–15)
AST: 25 U/L (ref 15–41)
Albumin: 3.2 g/dL — ABNORMAL LOW (ref 3.5–5.0)
Alkaline Phosphatase: 76 U/L (ref 38–126)
BILIRUBIN TOTAL: 0.2 mg/dL — AB (ref 0.3–1.2)
BUN: 15 mg/dL (ref 8–23)
CALCIUM: 8.9 mg/dL (ref 8.9–10.3)
CO2: 27 mmol/L (ref 22–32)
Chloride: 99 mmol/L (ref 98–111)
Creatinine, Ser: 0.86 mg/dL (ref 0.44–1.00)
GFR calc non Af Amer: >60 mL/min (ref >60)
Glucose, Bld: 135 mg/dL — ABNORMAL HIGH (ref 70–99)
Potassium: 3.7 mmol/L (ref 3.5–5.1)
Sodium: 135 mmol/L (ref 135–145)
TOTAL PROTEIN: 6.4 g/dL — AB (ref 6.5–8.1)

## 2018-02-13 LAB — TSH: TSH: 1.402 u[IU]/mL (ref 0.350–4.500)

## 2018-02-13 MED ORDER — ONDANSETRON HCL 4 MG/2ML IJ SOLN
8.0000 mg | Freq: Once | INTRAMUSCULAR | Status: AC
  Administered 2018-02-13: 8 mg via INTRAVENOUS
  Filled 2018-02-13: qty 4

## 2018-02-13 MED ORDER — SODIUM CHLORIDE 0.9 % IV SOLN
Freq: Once | INTRAVENOUS | Status: AC
  Filled 2018-02-13: qty 250

## 2018-02-13 MED ORDER — SODIUM CHLORIDE 0.9 % IV SOLN
200.0000 mg | Freq: Once | INTRAVENOUS | Status: AC
  Administered 2018-02-13: 200 mg via INTRAVENOUS
  Filled 2018-02-13: qty 8

## 2018-02-13 MED ORDER — DEXAMETHASONE SODIUM PHOSPHATE 10 MG/ML IJ SOLN
10.0000 mg | Freq: Once | INTRAMUSCULAR | Status: AC
  Administered 2018-02-13: 10 mg via INTRAVENOUS
  Filled 2018-02-13: qty 1

## 2018-02-13 MED ORDER — SODIUM CHLORIDE 0.9 % IV SOLN
700.0000 mg | Freq: Once | INTRAVENOUS | Status: AC
  Administered 2018-02-13: 700 mg via INTRAVENOUS
  Filled 2018-02-13: qty 20

## 2018-02-13 MED ORDER — HEPARIN SOD (PORK) LOCK FLUSH 100 UNIT/ML IV SOLN
500.0000 [IU] | Freq: Once | INTRAVENOUS | Status: AC
  Administered 2018-02-13: 500 [IU] via INTRAVENOUS
  Filled 2018-02-13: qty 5

## 2018-02-13 MED ORDER — SODIUM CHLORIDE 0.9% FLUSH
10.0000 mL | Freq: Once | INTRAVENOUS | Status: AC
  Administered 2018-02-13: 10 mL via INTRAVENOUS
  Filled 2018-02-13: qty 10

## 2018-02-13 MED ORDER — SODIUM CHLORIDE 0.9 % IV SOLN
Freq: Once | INTRAVENOUS | Status: AC
  Administered 2018-02-13: 14:00:00 via INTRAVENOUS
  Filled 2018-02-13: qty 250

## 2018-02-13 NOTE — Progress Notes (Signed)
Hematology/Oncology follow up note [facility] [phone] [phone]   Patient Care Team: McLean-Scocuzza, Nino Glow, MD as PCP - General (Internal Medicine) Wellington Hampshire, MD as Consulting Physician (Cardiology) Telford Nab, RN as Registered Nurse  REFERRING PROVIDER: [place] VISIT  follow-up for assessment after chemotherapy treatment for stage IV lung adenocarcinoma. HISTORY OF PRESENTING ILLNESS:  Tamara Burton is a  84 y.o.  female with PMH listed below who was referred to me for evaluation of lung cancer.  Patient reports a remote history of lung cancer in 2003 and status post left lower lobectomy.  We obtained medical  records from [facility] in [place].  Reviewed pathology results.  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  Perihilar lymph node negative.  Peri-bronchial lymph node negative. Patient is accompanied by her friend who is her house health power of attorney Patient has noticed mild chronic left side chest wall pain which she described as a stitch, for about 6 months to a year.  Pain is very mild and she takes Tylenol or tramadol as needed.  Pain got little worse early this year. 07/15/2017 CT chest with contrast showed new left pleural process since prior CT from 2016.  Findings most suspicious for pleural tumor with complex pleural fluid.  Medial left upper lobe atelectasis without obvious tumor. 08/16/2017 PET scan showed multifocal left side pleural hypermetabolic is him and a soft tissue thickening, consistent with pleural metastasis Hyper metabolic soft tissue density within the lingula, suspicious for primary bronchogenic carcinoma.   There is also left adrenal hypermetabolic them is nonspecific and without CT correlate.  Otherwise no evidence of extra thoracic metastatic  disease.  Right nephrolithiasis.  # 08/26/2017 CT biopsy of left lower chest pleura showed positive for adenocarcinoma.   Patient's case was discussed on tumor board on Sep 01, 2017.  Per pathology, there is no enough tissue for foundation one testing or Omniseq.  Patient was referred by pulmonology Dr. Shawna Orleans to me to discuss about management plan.  INTERVAL HISTORY Tamara Burton is a 84 y.o. female who has above history reviewed by me today presents for assessment prior to cycle 7 chemotherapy with maintenance Alimta and immunotherapy.   # Chemotherapy and immunotherapy. Tolerates well with manageble nausea. No fever or chills, or diarrhea.  # Fatigue, chronic, intermittent, she reports "having good days and bad days".   # Anxiety, takes Paxil 10mg  daily, symptom is well controlled.  # left anterior chest wall aches. Stable.   She continues to be active. Exercise daily.   #Oncology treatment Status post 4 cycles of carbo/Alimta/Keytruda [based on Keynote 189 study, triplet improved ORR, PFS, and OS]. Starting cycle 5, patient was started on Alimta and Keytruda maintenance.  Review of Systems  Constitutional: Positive for malaise/fatigue. Negative for chills, fever and weight loss.  HENT: Negative for congestion, ear discharge, ear pain, nosebleeds, sinus pain and sore throat.   Eyes: Negative for double vision, photophobia, pain, discharge and redness.  Respiratory: Negative for cough, hemoptysis, sputum production, shortness of breath and wheezing.   Cardiovascular: Negative for chest pain, palpitations, orthopnea, claudication and leg swelling.  Gastrointestinal: Negative for abdominal pain, blood in stool, constipation, diarrhea, heartburn, melena, nausea and vomiting.  Genitourinary: Negative for dysuria, flank pain, frequency and hematuria.  Musculoskeletal: Negative for back pain, myalgias and neck pain.  Skin: Negative for itching and rash.  Neurological: Negative for  dizziness, tingling, tremors, focal weakness, weakness and headaches.  Endo/Heme/Allergies: Negative for environmental allergies. Does not bruise/bleed easily.  Psychiatric/Behavioral: Negative for depression and hallucinations. The patient is not nervous/anxious.     MEDICAL HISTORY:  Past Medical History:  Diagnosis Date  . Arthritis    "qwhere"  . Carotid artery disease ([place])   . Chronic lower back pain   . Colon polyps    benign per pt   . Epistaxis    with use of nasal sprays   . Headache    "lately; related to HTN" (04/24/2014)  . Hyperlipidemia   . Hypertension   . Hyponatremia    h/o  . Kidney stone   . Lung cancer ([place]) 2003   s/p left lower lobe resection  . Non-obstructive CAD    a. 04/2014 Cath: LM nl, LAD/D1/LCX/RCA min irregs, RPDA/PAV nl.  . Orthostatic hypotension   . OSA on CPAP    "wear it intermittently"  . PONV (postoperative nausea and vomiting)    "when I was younger"  . Renal artery stenosis ([place])    a. 04/2014 Renal angiography: LRA 95p, RRA 40ost. PTA of LRA deferred 2/2 guide cath induced Ao dissection;  01/27 6 x 15 mm Herculink stent to the Left-RA  follows with [facility] Dr. Ronalee Belts  . Sinus headache     SURGICAL HISTORY: Past Surgical History:  Procedure Laterality Date  . APPENDECTOMY    . BILATERAL OOPHORECTOMY Bilateral 2000's  . CARDIAC CATHETERIZATION  04/24/2014  . CATARACT EXTRACTION W/ INTRAOCULAR LENS  IMPLANT, BILATERAL Bilateral   . JOINT REPLACEMENT    . LEFT HEART CATHETERIZATION WITH CORONARY ANGIOGRAM N/A 04/24/2014   Procedure: LEFT HEART CATHETERIZATION WITH CORONARY ANGIOGRAM;  Surgeon: Wellington Hampshire, MD;  Location: [facility] CATH LAB;  Service: Cardiovascular;  Laterality: N/A;  . LUNG LOBECTOMY Left 2003   "lower lobe"  . PERIPHERAL VASCULAR CATHETERIZATION  05/01/2014   Procedure: RENAL INTERVENTION;  Surgeon: Wellington Hampshire, MD; 6 x 15 mm Herculink stent to the Left Renal Artery  . PORTA CATH INSERTION N/A 09/26/2017    Procedure: PORTA CATH INSERTION;  Surgeon: Algernon Huxley, MD;  Location: [facility] CV LAB;  Service: Cardiovascular;  Laterality: N/A;  . RENAL ANGIOGRAM  04/24/2014  . RENAL ANGIOGRAM N/A 04/24/2014   Procedure: RENAL ANGIOGRAM;  Surgeon: Wellington Hampshire, MD;  Location: [facility];  Service: Cardiovascular;  Laterality: N/A;  . RENAL ANGIOGRAM Left 05/01/2014   Procedure: RENAL ANGIOGRAM;  Surgeon: Wellington Hampshire, MD;  Location: [facility] CATH LAB;  Service: Cardiovascular;  Laterality: Left;  . REPLACEMENT TOTAL KNEE Left   . TOTAL HIP ARTHROPLASTY Right   . TUBAL LIGATION  ~ 1975  . VAGINAL DELIVERY  X 2  . VAGINAL HYSTERECTOMY  1980's    SOCIAL HISTORY: Social History   Socioeconomic History  . Marital status: Widowed    Spouse name: Not on file  . Number of children: Not on file  . Years of education: Not on file  . Highest education level: Not on file  Occupational History  . Not on file  Social Needs  . Financial resource strain: Not hard at all  . Food insecurity:    Worry: Never true    Inability: Never true  . Transportation needs:    Medical: No    Non-medical: No  Tobacco Use  . Smoking status: Never Smoker  . Smokeless tobacco: Never Used  . Tobacco comment: Father and  husband both smokers  Substance and Sexual Activity  . Alcohol use: Yes    Comment: 04/24/2014 "glass of beer or wine q couple weeks"  . Drug use: No  . Sexual activity: Not on file  Lifestyle  . Physical activity:    Days per week: Not on file    Minutes per session: Not on file  . Stress: Not on file  Relationships  . Social connections:    Talks on phone: Not on file    Gets together: Not on file    Attends religious service: Not on file    Active member of club or organization: Not on file    Attends meetings of clubs or organizations: Not on file    Relationship status: Not on file  . Intimate partner violence:    Fear of current or ex partner: Not on file    Emotionally abused: Not  on file    Physically abused: Not on file    Forced sexual activity: Not on file  Other Topics Concern  . Not on file  Social History Narrative   Lives in [place]. Daughter nearby. Used to live in [place]   Normal ADLs as of 02/2017 and no h/o falls    Likes to walk   Lives alone     FAMILY HISTORY: Family History  Problem Relation Age of Onset  . Parkinsonism Mother   . Alzheimer's disease Mother   . Sarcoidosis Brother   . Stroke Maternal Grandmother   . Stomach cancer Paternal Grandmother   . Diabetes Son   . Diabetes Daughter     ALLERGIES:  is allergic to atorvastatin; penicillins; sulfa antibiotics; and amlodipine.  MEDICATIONS:  Current Outpatient Medications  Medication Sig Dispense Refill  . Ascorbic Acid (VITAMIN C CR) 1500 MG TBCR Take 1,500 mg by mouth daily.     Marland Kitchen aspirin 81 MG tablet Take 81 mg by mouth daily.    . Biotin 2500 MCG CAPS Take 1 capsule by mouth daily.    . Cholecalciferol (VITAMIN D3) 5000 units CAPS Take 1 capsule by mouth daily.    . Coenzyme Q10 (CO Q-10) 200 MG CAPS Take 1 capsule by mouth 2 (two) times daily.    Marland Kitchen docusate sodium (COLACE) 100 MG capsule Take 1 capsule (100 mg total) by mouth daily as needed for mild constipation or moderate constipation. Do not take if you have loose stools 30 capsule 2  . folic acid (FOLVITE) 1 MG tablet TAKE 1 TABLET BY MOUTH EVERY DAY 30 tablet 3  . glucosamine-chondroitin 500-400 MG tablet Take 1 tablet by mouth 2 (two) times daily.    . hydrALAZINE (APRESOLINE) 25 MG tablet Take 1 tablet (25 mg total) by mouth 3 (three) times daily. (Patient taking differently: Take 25 mg by mouth 3 (three) times daily as needed. ) 90 tablet 6  . lidocaine-prilocaine (EMLA) cream Apply to affected area once 30 g 3  . Magnesium 500 MG CAPS Take 1 capsule by mouth daily.    . ondansetron (ZOFRAN) 8 MG tablet TAKE 1 TABLET BY MOUTH TWICE A DAY **START DAY AFTER CHEMO X3DAYS THEN AS NEEDED** 21 tablet 0  .  PARoxetine (PAXIL) 10 MG tablet Take 1 tablet (10 mg total) by mouth daily. 30 tablet 1  . prochlorperazine (COMPAZINE) 10 MG tablet Take 1 tablet (10 mg total) by mouth every 6 (six) hours as needed (Nausea or vomiting). 30 tablet 1  . Red Yeast Rice Extract (RED YEAST  RICE PO) Take 1,200 mg by mouth 2 (two) times daily.    . traMADol (ULTRAM) 50 MG tablet Take 1 tablet (50 mg total) by mouth daily as needed. For back pain/joint pain (Patient taking differently: Take 50 mg by mouth daily as needed for moderate pain. For back pain/joint pain) 90 tablet 0  . zinc gluconate 50 MG tablet Take 50 mg by mouth daily.     No current facility-administered medications for this visit.      PHYSICAL EXAMINATION: ECOG PERFORMANCE STATUS: 1 - Symptomatic but completely ambulatory Vitals:   02/13/18 1330  BP: (!) 144/79  Pulse: 92  Resp: 18  Temp: (!) 96.7 F (35.9 C)  SpO2: 97%   Filed Weights   02/13/18 1330  Weight: 120 lb 4.8 oz (54.6 kg)    Physical Exam  Constitutional: She is oriented to person, place, and time. No distress.  HENT:  Head: Normocephalic and atraumatic.  Mouth/Throat: Oropharynx is clear and moist.  Eyes: Pupils are equal, round, and reactive to light. Conjunctivae and EOM are normal. No scleral icterus.  Neck: Normal range of motion. Neck supple.  Cardiovascular: Normal rate, regular rhythm and normal heart sounds.  Pulmonary/Chest: Effort normal and breath sounds normal. No respiratory distress. She has no wheezes. She has no rales. She exhibits no tenderness.  Decreased breath sounds on the left lower lung base  Abdominal: Soft. Bowel sounds are normal. She exhibits no distension and no mass. There is no tenderness.  Musculoskeletal: Normal range of motion. She exhibits no edema or deformity.  Lymphadenopathy:    She has no cervical adenopathy.  Neurological: She is alert and oriented to person, place, and time. No cranial nerve deficit. Coordination normal.  Skin:  Skin is warm and dry. No rash noted. No erythema.  Psychiatric: She has a normal mood and affect. Her behavior is normal. Thought content normal.     LABORATORY DATA:  I have reviewed the data as listed Lab Results  Component Value Date   WBC 6.6 02/13/2018   HGB 9.4 (L) 02/13/2018   HCT 30.0 (L) 02/13/2018   MCV 86.5 02/13/2018   PLT 394 02/13/2018   Recent Labs    01/02/18 0801 01/23/18 0821 02/13/18 1307  NA 138 140 135  K 3.8 3.8 3.7  CL 102 104 99  CO2 29 28 27   GLUCOSE 127* 138* 135*  BUN 13 14 15   CREATININE 0.62 0.67 0.86  CALCIUM 9.0 9.1 8.9  GFRNONAA >60 >60 >60  GFRAA >60 >60 >60  PROT 6.2* 6.4* 6.4*  ALBUMIN 3.4* 3.1* 3.2*  AST 24 25 25   ALT 11 15 12   ALKPHOS 114 93 76  BILITOT 0.6 0.3 0.2*   RADIOGRAPHIC STUDIES: I have personally reviewed the radiological images as listed and agreed with the findings in the report. 12/06/2017 PET scan was independently reviewed by me and discussed with patient and her friends.  PET scan showed notable reduced activity in the pleural metastatic disease on the left.  Reduce lingular mass activity.  There was a loculated left pleural effusion has moderately enlarged.  Currently no definite adrenal hypermetabolic activity.  Chronic findings include aortic atherosclerosis, coronary atherosclerosis, nonobstructing right nephrolithiasis.  Dense mitral calcification    ASSESSMENT & PLAN:  84 year old female who has a history of left lower lobe primary lung adenocarcinoma present for evaluation of newly diagnosed 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 1. Malignant neoplasm of left lung, unspecified part of lung ([place])  2. Encounter for antineoplastic chemotherapy   3. Encounter for antineoplastic immunotherapy   4. Pleural metastasis ([place])   Cancer Staging Malignant neoplasm of unspecified part of unspecified bronchus or lung ([place]) Staging form: Lung, AJCC 8th Edition - Clinical stage from 09/02/2017: Stage  IV (cT2a, cN0, pM1a) - Signed by Earlie Server, MD on 09/02/2017  #Stage IV left lower lobe primary lung adenocarcinoma with pleural involvement. Tolerates maintenance Alimta and keytruda well.  Labs reviewed and discussed with patient. Counts are acceptable to proceed with today's  Alimta and Keytruda treatment today. Continue Folic acid daily. B12 injection every 9 weeks. Next dose due in December.  Obtain CT chest wo contrast prior to next visit.   #Normocytic anemia, due to chemotherapy. Hemoglobin stable.   #Anxiety, well controlled continue Paxil 10 mg daily.    #Fatigue, secondary to chemotherapy and anemia.stable. Monitor  We spent sufficient time to discuss many aspect of care, questions were answered to patient's satisfaction.  Return of visit: 3 weeks for maintenance chemotherapy and immunotherapy. Total face to face encounter time for this patient visit was 25 min. >50% of the time was  spent in counseling and coordination of care.  Earlie Server, MD, PhD Hematology Oncology [facility] Pager- [phone] 02/13/2018

## 2018-02-13 NOTE — Progress Notes (Signed)
Patient here for follow up. No concerns voiced.  °

## 2018-02-15 ENCOUNTER — Encounter: Payer: Self-pay | Admitting: Internal Medicine

## 2018-02-15 ENCOUNTER — Ambulatory Visit (INDEPENDENT_AMBULATORY_CARE_PROVIDER_SITE_OTHER): Payer: Medicare HMO | Admitting: Internal Medicine

## 2018-02-15 VITALS — BP 132/62 | HR 88 | Temp 98.6°F | Ht <= 58 in | Wt 121.6 lb

## 2018-02-15 DIAGNOSIS — C3492 Malignant neoplasm of unspecified part of left bronchus or lung: Secondary | ICD-10-CM

## 2018-02-15 DIAGNOSIS — E785 Hyperlipidemia, unspecified: Secondary | ICD-10-CM

## 2018-02-15 DIAGNOSIS — Z85118 Personal history of other malignant neoplasm of bronchus and lung: Secondary | ICD-10-CM | POA: Diagnosis not present

## 2018-02-15 DIAGNOSIS — R3915 Urgency of urination: Secondary | ICD-10-CM

## 2018-02-15 DIAGNOSIS — C3490 Malignant neoplasm of unspecified part of unspecified bronchus or lung: Secondary | ICD-10-CM

## 2018-02-15 DIAGNOSIS — R739 Hyperglycemia, unspecified: Secondary | ICD-10-CM

## 2018-02-15 DIAGNOSIS — I701 Atherosclerosis of renal artery: Secondary | ICD-10-CM

## 2018-02-15 DIAGNOSIS — D509 Iron deficiency anemia, unspecified: Secondary | ICD-10-CM

## 2018-02-15 DIAGNOSIS — E559 Vitamin D deficiency, unspecified: Secondary | ICD-10-CM

## 2018-02-15 DIAGNOSIS — N200 Calculus of kidney: Secondary | ICD-10-CM

## 2018-02-15 DIAGNOSIS — F419 Anxiety disorder, unspecified: Secondary | ICD-10-CM

## 2018-02-15 DIAGNOSIS — M8580 Other specified disorders of bone density and structure, unspecified site: Secondary | ICD-10-CM | POA: Diagnosis not present

## 2018-02-15 DIAGNOSIS — E2839 Other primary ovarian failure: Secondary | ICD-10-CM | POA: Diagnosis not present

## 2018-02-15 DIAGNOSIS — C782 Secondary malignant neoplasm of pleura: Secondary | ICD-10-CM

## 2018-02-15 DIAGNOSIS — R319 Hematuria, unspecified: Secondary | ICD-10-CM

## 2018-02-15 DIAGNOSIS — E538 Deficiency of other specified B group vitamins: Secondary | ICD-10-CM

## 2018-02-15 DIAGNOSIS — I150 Renovascular hypertension: Secondary | ICD-10-CM

## 2018-02-15 DIAGNOSIS — R69 Illness, unspecified: Secondary | ICD-10-CM | POA: Diagnosis not present

## 2018-02-15 NOTE — Patient Instructions (Addendum)
Premier protein shake  Tumeric can help with joint pain  Calcium carbonate 600 mg 1x per day  Vitamin D 2000-5000 D3 IU daily   We ordered a bone density for you  Schedule fasting labs asap fast x 12 hours only water and medication    Iron Deficiency Anemia, Adult Iron deficiency anemia is a condition in which the concentration of red blood cells or hemoglobin in the blood is below normal because of too little iron. Hemoglobin is a substance in red blood cells that carries oxygen to the body's tissues. When the concentration of red blood cells or hemoglobin is too low, not enough oxygen reaches these tissues. Iron deficiency anemia is usually long-lasting (chronic) and it develops over time. It may or may not cause symptoms. It is a common type of anemia. What are the causes? This condition may be caused by:  Not enough iron in the diet.  Blood loss caused by bleeding in the intestine.  Blood loss from a gastrointestinal condition like Crohn disease.  Frequent blood draws, such as from blood donation.  Abnormal absorption in the gut.  Heavy menstrual periods in women.  Cancers of the gastrointestinal system, such as colon cancer.  What are the signs or symptoms? Symptoms of this condition may include:  Fatigue.  Headache.  Pale skin, lips, and nail beds.  Poor appetite.  Weakness.  Shortness of breath.  Dizziness.  Cold hands and feet.  Fast or irregular heartbeat.  Irritability. This is more common in severe anemia.  Rapid breathing. This is more common in severe anemia.  Mild anemia may not cause any symptoms. How is this diagnosed? This condition is diagnosed based on:  Your medical history.  A physical exam.  Blood tests.  You may have additional tests to find the underlying cause of your anemia, such as:  Testing for blood in the stool (fecal occult blood test).  A procedure to see inside your colon and rectum (colonoscopy).  A procedure to  see inside your esophagus and stomach (endoscopy).  A test in which cells are removed from bone marrow (bone marrow aspiration) or fluid is removed from the bone marrow to be examined (biopsy). This is rarely needed.  How is this treated? This condition is treated by correcting the cause of your iron deficiency. Treatment may involve:  Adding iron-rich foods to your diet.  Taking iron supplements. If you are pregnant or breastfeeding, you may need to take extra iron because your normal diet usually does not provide the amount of iron that you need.  Increasing vitamin C intake. Vitamin C helps your body absorb iron. Your health care provider may recommend that you take iron supplements along with a glass of orange juice or a vitamin C supplement.  Medicines to make heavy menstrual flow lighter.  Surgery.  You may need repeat blood tests to determine whether treatment is working. Depending on the underlying cause, the anemia should be corrected within 2 months of starting treatment. If the treatment does not seem to be working, you may need more testing. Follow these instructions at home: Medicines  Take over-the-counter and prescription medicines only as told by your health care provider. This includes iron supplements and vitamins.  If you cannot tolerate taking iron supplements by mouth, talk with your health care provider about taking them through a vein (intravenously) or an injection into a muscle.  For the best iron absorption, you should take iron supplements when your stomach is empty. If you  cannot tolerate them on an empty stomach, you may need to take them with food.  Do not drink milk or take antacids at the same time as your iron supplements. Milk and antacids may interfere with iron absorption.  Iron supplements can cause constipation. To prevent constipation, include fiber in your diet as told by your health care provider. A stool softener may also be recommended. Eating  and drinking  Talk with your health care provider before changing your diet. He or she may recommend that you eat foods that contain a lot of iron, such as: ? Liver. ? Low-fat (lean) beef. ? Breads and cereals that have iron added to them (are fortified). ? Eggs. ? Dried fruit. ? Dark green, leafy vegetables.  To help your body use the iron from iron-rich foods, eat those foods at the same time as fresh fruits and vegetables that are high in vitamin C. Foods that are high in vitamin C include: ? Oranges. ? Peppers. ? Tomatoes. ? Mangoes.  Drinkenoughfluid to keep your urine clear or pale yellow. General instructions  Return to your normal activities as told by your health care provider. Ask your health care provider what activities are safe for you.  Practice good hygiene. Anemia can make you more prone to illness and infection.  Keep all follow-up visits as told by your health care provider. This is important. Contact a health care provider if:  You feel nauseous or you vomit.  You feel weak.  You have unexplained sweating.  You develop symptoms of constipation, such as: ? Having fewer than three bowel movements a week. ? Straining to have a bowel movement. ? Having stools that are hard, dry, or larger than normal. ? Feeling full or bloated. ? Pain in the lower abdomen. ? Not feeling relief after having a bowel movement. Get help right away if:  You faint. If this happens, do not drive yourself to the hospital. Call your local emergency services (911 in the [place]  You have chest pain.  You have shortness of breath that: ? Is severe. ? Gets worse with physical activity.  You have a rapid heartbeat.  You become light-headed when getting up from a sitting or lying down position. This information is not intended to replace advice given to you by your health care provider. Make sure you discuss any questions you have with your health care provider. Document  Released: 03/19/2000 Document Revised: 12/10/2015 Document Reviewed: 12/10/2015 Elsevier Interactive Patient Education  2018 [employer].

## 2018-02-15 NOTE — Progress Notes (Signed)
Chief Complaint  Patient presents with  . Follow-up   Follow up  1. HTN s/p left renal stent - controlled w/o medications she is not taking hydralazine 25 tid prn and stopped other meds paxil 10 mg is helping 2. Lung cancer IV recurrent in left upper lobe s/p left lower lobe lung removal. Weight is stable, appetite is down but still trying to eat. F/u H/o on Alimta and Keytruda denies nausea but she has lost some hair and decided to shave her hair off. PET sch 12/06/17 pleural effusion left upper lobe cancer 3. HLD. She stopped zetia 10 mg due to thinking it was causing her legs to ache. lipitor in the past caused muscle weakness  4. Anxiety on paxil 10 mg qd and xanax 0.25 1/2 pill qd prn  5. Iron def anemia being monitored by H/o cant tolerate oral iron due to constipation h/h 9.9/30.0   Review of Systems  Constitutional: Negative for weight loss.  HENT: Negative for hearing loss.   Eyes: Negative for blurred vision.  Respiratory: Negative for shortness of breath.   Cardiovascular: Negative for chest pain.  Gastrointestinal: Positive for constipation.       Reduced appetite   Musculoskeletal: Negative for falls.  Skin: Negative for rash.  Neurological: Negative for headaches.  Psychiatric/Behavioral: Negative for depression and memory loss.   Past Medical History:  Diagnosis Date  . Arthritis    "qwhere"  . Carotid artery disease ([place])   . Chronic lower back pain   . Colon polyps    benign per pt   . Epistaxis    with use of nasal sprays   . Headache    "lately; related to HTN" (04/24/2014)  . Hyperlipidemia   . Hypertension   . Hyponatremia    h/o  . Kidney stone   . Lung cancer ([place]) 2003   s/p left lower lobe resection  . Non-obstructive CAD    a. 04/2014 Cath: LM nl, LAD/D1/LCX/RCA min irregs, RPDA/PAV nl.  . Orthostatic hypotension   . OSA on CPAP    "wear it intermittently"  . PONV (postoperative nausea and vomiting)    "when I was younger"  . Renal artery  stenosis ([place])    a. 04/2014 Renal angiography: LRA 95p, RRA 40ost. PTA of LRA deferred 2/2 guide cath induced Ao dissection;  01/27 6 x 15 mm Herculink stent to the Left-RA  follows with [facility] Dr. Ronalee Belts  . Sinus headache    Past Surgical History:  Procedure Laterality Date  . APPENDECTOMY    . BILATERAL OOPHORECTOMY Bilateral 2000's  . CARDIAC CATHETERIZATION  04/24/2014  . CATARACT EXTRACTION W/ INTRAOCULAR LENS  IMPLANT, BILATERAL Bilateral   . JOINT REPLACEMENT    . LEFT HEART CATHETERIZATION WITH CORONARY ANGIOGRAM N/A 04/24/2014   Procedure: LEFT HEART CATHETERIZATION WITH CORONARY ANGIOGRAM;  Surgeon: Wellington Hampshire, MD;  Location: [facility];  Service: Cardiovascular;  Laterality: N/A;  . LUNG LOBECTOMY Left 2003   "lower lobe"  . PERIPHERAL VASCULAR CATHETERIZATION  05/01/2014   Procedure: RENAL INTERVENTION;  Surgeon: Wellington Hampshire, MD; 6 x 15 mm Herculink stent to the Left Renal Artery  . PORTA CATH INSERTION N/A 09/26/2017   Procedure: PORTA CATH INSERTION;  Surgeon: Algernon Huxley, MD;  Location: [facility];  Service: Cardiovascular;  Laterality: N/A;  . RENAL ANGIOGRAM  04/24/2014  . RENAL ANGIOGRAM N/A 04/24/2014   Procedure: RENAL ANGIOGRAM;  Surgeon: Wellington Hampshire, MD;  Location: [facility];  Service: Cardiovascular;  Laterality: N/A;  . RENAL ANGIOGRAM Left 05/01/2014   Procedure: RENAL ANGIOGRAM;  Surgeon: Wellington Hampshire, MD;  Location: [facility];  Service: Cardiovascular;  Laterality: Left;  . REPLACEMENT TOTAL KNEE Left   . TOTAL HIP ARTHROPLASTY Right   . TUBAL LIGATION  ~ 1975  . VAGINAL DELIVERY  X 2  . VAGINAL HYSTERECTOMY  1980's   Family History  Problem Relation Age of Onset  . Parkinsonism Mother   . Alzheimer's disease Mother   . Sarcoidosis Brother   . Stroke Maternal Grandmother   . Stomach cancer Paternal Grandmother   . Diabetes Son   . Diabetes Daughter    Social History   Socioeconomic History  . Marital status: Widowed     Spouse name: Not on file  . Number of children: Not on file  . Years of education: Not on file  . Highest education level: Not on file  Occupational History  . Not on file  Social Needs  . Financial resource strain: Not hard at all  . Food insecurity:    Worry: Never true    Inability: Never true  . Transportation needs:    Medical: No    Non-medical: No  Tobacco Use  . Smoking status: Never Smoker  . Smokeless tobacco: Never Used  . Tobacco comment: Father and husband both smokers  Substance and Sexual Activity  . Alcohol use: Yes    Comment: 04/24/2014 "glass of beer or wine q couple weeks"  . Drug use: No  . Sexual activity: Not on file  Lifestyle  . Physical activity:    Days per week: Not on file    Minutes per session: Not on file  . Stress: Not on file  Relationships  . Social connections:    Talks on phone: Not on file    Gets together: Not on file    Attends religious service: Not on file    Active member of club or organization: Not on file    Attends meetings of clubs or organizations: Not on file    Relationship status: Not on file  . Intimate partner violence:    Fear of current or ex partner: Not on file    Emotionally abused: Not on file    Physically abused: Not on file    Forced sexual activity: Not on file  Other Topics Concern  . Not on file  Social History Narrative   Lives in [place]. Daughter nearby. Used to live in [place]   Normal ADLs as of 02/2017 and no h/o falls    Likes to walk   Lives alone    Current Meds  Medication Sig  . Ascorbic Acid (VITAMIN C CR) 1500 MG TBCR Take 1,500 mg by mouth daily.   Marland Kitchen aspirin 81 MG tablet Take 81 mg by mouth daily.  . Biotin 2500 MCG CAPS Take 1 capsule by mouth daily.  . Cholecalciferol (VITAMIN D3) 5000 units CAPS Take 1 capsule by mouth daily.  . Coenzyme Q10 (CO Q-10) 200 MG CAPS Take 1 capsule by mouth 2 (two) times daily.  Marland Kitchen docusate sodium (COLACE) 100 MG capsule Take 1 capsule (100  mg total) by mouth daily as needed for mild constipation or moderate constipation. Do not take if you have loose stools  . folic acid (FOLVITE) 1 MG tablet TAKE 1 TABLET BY MOUTH EVERY DAY  . glucosamine-chondroitin 500-400 MG tablet Take 1 tablet by mouth 2 (two) times daily.  Marland Kitchen  hydrALAZINE (APRESOLINE) 25 MG tablet Take 1 tablet (25 mg total) by mouth 3 (three) times daily. (Patient taking differently: Take 25 mg by mouth 3 (three) times daily as needed. )  . lidocaine-prilocaine (EMLA) cream Apply to affected area once  . Magnesium 500 MG CAPS Take 1 capsule by mouth daily.  . ondansetron (ZOFRAN) 8 MG tablet TAKE 1 TABLET BY MOUTH TWICE A DAY **START DAY AFTER CHEMO X3DAYS THEN AS NEEDED**  . PARoxetine (PAXIL) 10 MG tablet Take 1 tablet (10 mg total) by mouth daily.  . prochlorperazine (COMPAZINE) 10 MG tablet Take 1 tablet (10 mg total) by mouth every 6 (six) hours as needed (Nausea or vomiting).  . Red Yeast Rice Extract (RED YEAST RICE PO) Take 1,200 mg by mouth 2 (two) times daily.  . traMADol (ULTRAM) 50 MG tablet Take 1 tablet (50 mg total) by mouth daily as needed. For back pain/joint pain (Patient taking differently: Take 50 mg by mouth daily as needed for moderate pain. For back pain/joint pain)  . zinc gluconate 50 MG tablet Take 50 mg by mouth daily.   Allergies  Allergen Reactions  . Atorvastatin Other (See Comments)    Muscle weakness  . Penicillins Hives    Has patient had a PCN reaction causing immediate rash, facial/tongue/throat swelling, SOB or lightheadedness with hypotension: Yes Has patient had a PCN reaction causing severe rash involving mucus membranes or skin necrosis: No Has patient had a PCN reaction that required hospitalization: No Has patient had a PCN reaction occurring within the last 10 years: No If all of the above answers are "NO", then may proceed with Cephalosporin use.  . Sulfa Antibiotics Hives  . Amlodipine     Weakness and confusion   Recent  Results (from the past 2160 hour(s))  Comprehensive metabolic panel     Status: Abnormal   Collection Time: 11/21/17  8:15 AM  Result Value Ref Range   Sodium 139 135 - 145 mmol/L   Potassium 4.2 3.5 - 5.1 mmol/L   Chloride 102 98 - 111 mmol/L   CO2 25 22 - 32 mmol/L   Glucose, Bld 154 (H) 70 - 99 mg/dL   BUN 14 8 - 23 mg/dL   Creatinine, Ser 0.74 0.44 - 1.00 mg/dL   Calcium 9.4 8.9 - 10.3 mg/dL   Total Protein 6.4 (L) 6.5 - 8.1 g/dL   Albumin 3.6 3.5 - 5.0 g/dL   AST 26 15 - 41 U/L   ALT 15 0 - 44 U/L   Alkaline Phosphatase 114 38 - 126 U/L   Total Bilirubin 0.5 0.3 - 1.2 mg/dL   GFR calc non Af Amer >60 >60 mL/min   GFR calc Af Amer >60 >60 mL/min    Comment: (NOTE) The eGFR has been calculated using the CKD EPI equation. This calculation has not been validated in all clinical situations. eGFR's persistently <60 mL/min signify possible Chronic Kidney Disease.    Anion gap 12 5 - 15    Comment: Performed at [facility], [address] [place], [place] [address]  CBC with Differential     Status: Abnormal   Collection Time: 11/21/17  8:15 AM  Result Value Ref Range   WBC 9.7 3.6 - 11.0 K/uL   RBC 3.66 (L) 3.80 - 5.20 MIL/uL   Hemoglobin 11.1 (L) 12.0 - 16.0 g/dL   HCT 32.2 (L) 35.0 - 47.0 %   MCV 88.0 80.0 - 100.0 fL   MCH 30.2 26.0 -  34.0 pg   MCHC 34.3 32.0 - 36.0 g/dL   RDW 13.4 11.5 - 14.5 %   Platelets 382 150 - 440 K/uL   Neutrophils Relative % 83 %   Neutro Abs 8.1 (H) 1.4 - 6.5 K/uL   Lymphocytes Relative 8 %   Lymphs Abs 0.7 (L) 1.0 - 3.6 K/uL   Monocytes Relative 9 %   Monocytes Absolute 0.9 0.2 - 0.9 K/uL   Eosinophils Relative 0 %   Eosinophils Absolute 0.0 0 - 0.7 K/uL   Basophils Relative 0 %   Basophils Absolute 0.0 0 - 0.1 K/uL    Comment: Performed at [facility], [address] [place], [place] [address]  Glucose, capillary     Status: Abnormal   Collection Time: 12/06/17  9:12 AM  Result Value Ref Range   Glucose-Capillary 101  (H) 70 - 99 mg/dL  T3     Status: Abnormal   Collection Time: 12/12/17  8:12 AM  Result Value Ref Range   T3, Total 68 (L) 71 - 180 ng/dL    Comment: (NOTE) Performed At: [facility] [place], [place] [id number] Rush Farmer MD AY:[id number]   TSH     Status: Abnormal   Collection Time: 12/12/17  8:12 AM  Result Value Ref Range   TSH 0.293 (L) 0.350 - 4.500 uIU/mL    Comment: Performed by a 3rd Generation assay with a functional sensitivity of <=0.01 uIU/mL. Performed at [facility], [address] [place], [place] [address]   Comprehensive metabolic panel     Status: Abnormal   Collection Time: 12/12/17  8:12 AM  Result Value Ref Range   Sodium 137 135 - 145 mmol/L   Potassium 4.0 3.5 - 5.1 mmol/L   Chloride 101 98 - 111 mmol/L   CO2 28 22 - 32 mmol/L   Glucose, Bld 140 (H) 70 - 99 mg/dL   BUN 16 8 - 23 mg/dL   Creatinine, Ser 0.71 0.44 - 1.00 mg/dL   Calcium 9.2 8.9 - 10.3 mg/dL   Total Protein 6.4 (L) 6.5 - 8.1 g/dL   Albumin 3.7 3.5 - 5.0 g/dL   AST 27 15 - 41 U/L   ALT 14 0 - 44 U/L   Alkaline Phosphatase 101 38 - 126 U/L   Total Bilirubin 0.6 0.3 - 1.2 mg/dL   GFR calc non Af Amer >60 >60 mL/min   GFR calc Af Amer >60 >60 mL/min    Comment: (NOTE) The eGFR has been calculated using the CKD EPI equation. This calculation has not been validated in all clinical situations. eGFR's persistently <60 mL/min signify possible Chronic Kidney Disease.    Anion gap 8 5 - 15    Comment: Performed at [facility], [address] [place], [place] [address]  CBC with Differential     Status: Abnormal   Collection Time: 12/12/17  8:12 AM  Result Value Ref Range   WBC 9.3 3.6 - 11.0 K/uL   RBC 3.55 (L) 3.80 - 5.20 MIL/uL   Hemoglobin 10.6 (L) 12.0 - 16.0 g/dL   HCT 31.0 (L) 35.0 - 47.0 %   MCV 87.3 80.0 - 100.0 fL   MCH 29.8 26.0 - 34.0 pg   MCHC 34.1 32.0 - 36.0 g/dL   RDW 14.2 11.5 - 14.5 %   Platelets 388 150 - 440 K/uL    Neutrophils Relative % 77 %   Neutro Abs 7.2 (H) 1.4 - 6.5 K/uL  Lymphocytes Relative 10 %   Lymphs Abs 0.9 (L) 1.0 - 3.6 K/uL   Monocytes Relative 13 %   Monocytes Absolute 1.2 (H) 0.2 - 0.9 K/uL   Eosinophils Relative 0 %   Eosinophils Absolute 0.0 0 - 0.7 K/uL   Basophils Relative 0 %   Basophils Absolute 0.0 0 - 0.1 K/uL    Comment: Performed at [facility], [address] [place], [place] [address]  CBC with Differential     Status: Abnormal   Collection Time: 01/02/18  8:01 AM  Result Value Ref Range   WBC 5.3 3.6 - 11.0 K/uL   RBC 3.52 (L) 3.80 - 5.20 MIL/uL   Hemoglobin 10.4 (L) 12.0 - 16.0 g/dL   HCT 30.6 (L) 35.0 - 47.0 %   MCV 86.9 80.0 - 100.0 fL   MCH 29.5 26.0 - 34.0 pg   MCHC 34.0 32.0 - 36.0 g/dL   RDW 14.8 (H) 11.5 - 14.5 %   Platelets 371 150 - 440 K/uL   Neutrophils Relative % 66 %   Neutro Abs 3.5 1.4 - 6.5 K/uL   Lymphocytes Relative 19 %   Lymphs Abs 1.0 1.0 - 3.6 K/uL   Monocytes Relative 13 %   Monocytes Absolute 0.7 0.2 - 0.9 K/uL   Eosinophils Relative 1 %   Eosinophils Absolute 0.0 0 - 0.7 K/uL   Basophils Relative 1 %   Basophils Absolute 0.0 0 - 0.1 K/uL    Comment: Performed at [facility], [address] [place], [place] [address]  TSH     Status: None   Collection Time: 01/02/18  8:01 AM  Result Value Ref Range   TSH 0.875 0.350 - 4.500 uIU/mL    Comment: Performed by a 3rd Generation assay with a functional sensitivity of <=0.01 uIU/mL. Performed at [facility], [address] [place], [place] [address]   Comprehensive metabolic panel     Status: Abnormal   Collection Time: 01/02/18  8:01 AM  Result Value Ref Range   Sodium 138 135 - 145 mmol/L   Potassium 3.8 3.5 - 5.1 mmol/L   Chloride 102 98 - 111 mmol/L   CO2 29 22 - 32 mmol/L   Glucose, Bld 127 (H) 70 - 99 mg/dL   BUN 13 8 - 23 mg/dL   Creatinine, Ser 0.62 0.44 - 1.00 mg/dL   Calcium 9.0 8.9 - 10.3 mg/dL   Total Protein 6.2 (L) 6.5 - 8.1 g/dL    Albumin 3.4 (L) 3.5 - 5.0 g/dL   AST 24 15 - 41 U/L   ALT 11 0 - 44 U/L   Alkaline Phosphatase 114 38 - 126 U/L   Total Bilirubin 0.6 0.3 - 1.2 mg/dL   GFR calc non Af Amer >60 >60 mL/min   GFR calc Af Amer >60 >60 mL/min    Comment: (NOTE) The eGFR has been calculated using the CKD EPI equation. This calculation has not been validated in all clinical situations. eGFR's persistently <60 mL/min signify possible Chronic Kidney Disease.    Anion gap 7 5 - 15    Comment: Performed at [facility], [address] [place], [place] [address]  Comprehensive metabolic panel     Status: Abnormal   Collection Time: 01/23/18  8:21 AM  Result Value Ref Range   Sodium 140 135 - 145 mmol/L   Potassium 3.8 3.5 - 5.1 mmol/L   Chloride 104 98 - 111 mmol/L   CO2 28 22 -  32 mmol/L   Glucose, Bld 138 (H) 70 - 99 mg/dL   BUN 14 8 - 23 mg/dL   Creatinine, Ser 0.67 0.44 - 1.00 mg/dL   Calcium 9.1 8.9 - 10.3 mg/dL   Total Protein 6.4 (L) 6.5 - 8.1 g/dL   Albumin 3.1 (L) 3.5 - 5.0 g/dL   AST 25 15 - 41 U/L   ALT 15 0 - 44 U/L   Alkaline Phosphatase 93 38 - 126 U/L   Total Bilirubin 0.3 0.3 - 1.2 mg/dL   GFR calc non Af Amer >60 >60 mL/min   GFR calc Af Amer >60 >60 mL/min    Comment: (NOTE) The eGFR has been calculated using the CKD EPI equation. This calculation has not been validated in all clinical situations. eGFR's persistently <60 mL/min signify possible Chronic Kidney Disease.    Anion gap 8 5 - 15    Comment: Performed at [facility], [address] [place], [place] [address]  CBC with Differential     Status: Abnormal   Collection Time: 01/23/18  8:21 AM  Result Value Ref Range   WBC 5.6 4.0 - 10.5 K/uL   RBC 3.24 (L) 3.87 - 5.11 MIL/uL   Hemoglobin 9.1 (L) 12.0 - 15.0 g/dL   HCT 28.2 (L) 36.0 - 46.0 %   MCV 87.0 80.0 - 100.0 fL   MCH 28.1 26.0 - 34.0 pg   MCHC 32.3 30.0 - 36.0 g/dL   RDW 14.0 11.5 - 15.5 %   Platelets 393 150 - 400 K/uL   nRBC 0.0 0.0 - 0.2 %    Neutrophils Relative % 72 %   Neutro Abs 4.0 1.7 - 7.7 K/uL   Lymphocytes Relative 13 %   Lymphs Abs 0.8 0.7 - 4.0 K/uL   Monocytes Relative 13 %   Monocytes Absolute 0.8 0.1 - 1.0 K/uL   Eosinophils Relative 1 %   Eosinophils Absolute 0.1 0.0 - 0.5 K/uL   Basophils Relative 1 %   Basophils Absolute 0.0 0.0 - 0.1 K/uL   Immature Granulocytes 0 %   Abs Immature Granulocytes 0.02 0.00 - 0.07 K/uL    Comment: Performed at [facility], [address] [place], [place] [address]  TSH     Status: None   Collection Time: 01/23/18  8:21 AM  Result Value Ref Range   TSH 0.997 0.350 - 4.500 uIU/mL    Comment: Performed by a 3rd Generation assay with a functional sensitivity of <=0.01 uIU/mL. Performed at [facility], [address] [place], [place] [address]   Ferritin     Status: Abnormal   Collection Time: 01/23/18  8:32 AM  Result Value Ref Range   Ferritin 633 (H) 11 - 307 ng/mL    Comment: Performed at [facility], [address] [place], [place] [address]  Iron and TIBC     Status: Abnormal   Collection Time: 01/23/18  8:32 AM  Result Value Ref Range   Iron 16 (L) 28 - 170 ug/dL   TIBC 238 (L) 250 - 450 ug/dL   Saturation Ratios 7 (L) 10.4 - 31.8 %   UIBC 222 ug/dL    Comment: Performed at [facility], [address] [place], [place] [address]  Comprehensive metabolic panel     Status: Abnormal   Collection Time: 02/13/18  1:07 PM  Result Value Ref Range   Sodium 135 135 - 145 mmol/L   Potassium 3.7 3.5 - 5.1 mmol/L  Chloride 99 98 - 111 mmol/L   CO2 27 22 - 32 mmol/L   Glucose, Bld 135 (H) 70 - 99 mg/dL   BUN 15 8 - 23 mg/dL   Creatinine, Ser 0.86 0.44 - 1.00 mg/dL   Calcium 8.9 8.9 - 10.3 mg/dL   Total Protein 6.4 (L) 6.5 - 8.1 g/dL   Albumin 3.2 (L) 3.5 - 5.0 g/dL   AST 25 15 - 41 U/L   ALT 12 0 - 44 U/L   Alkaline Phosphatase 76 38 - 126 U/L   Total Bilirubin 0.2 (L) 0.3 - 1.2 mg/dL   GFR calc non Af Amer >60 >60 mL/min    GFR calc Af Amer >60 >60 mL/min    Comment: (NOTE) The eGFR has been calculated using the CKD EPI equation. This calculation has not been validated in all clinical situations. eGFR's persistently <60 mL/min signify possible Chronic Kidney Disease.    Anion gap 9 5 - 15    Comment: Performed at [facility], [address] [place], [place] [address]  CBC with Differential     Status: Abnormal   Collection Time: 02/13/18  1:07 PM  Result Value Ref Range   WBC 6.6 4.0 - 10.5 K/uL   RBC 3.47 (L) 3.87 - 5.11 MIL/uL   Hemoglobin 9.4 (L) 12.0 - 15.0 g/dL   HCT 30.0 (L) 36.0 - 46.0 %   MCV 86.5 80.0 - 100.0 fL   MCH 27.1 26.0 - 34.0 pg   MCHC 31.3 30.0 - 36.0 g/dL   RDW 14.4 11.5 - 15.5 %   Platelets 394 150 - 400 K/uL   nRBC 0.0 0.0 - 0.2 %   Neutrophils Relative % 69 %   Neutro Abs 4.6 1.7 - 7.7 K/uL   Lymphocytes Relative 14 %   Lymphs Abs 0.9 0.7 - 4.0 K/uL   Monocytes Relative 13 %   Monocytes Absolute 0.9 0.1 - 1.0 K/uL   Eosinophils Relative 2 %   Eosinophils Absolute 0.1 0.0 - 0.5 K/uL   Basophils Relative 1 %   Basophils Absolute 0.1 0.0 - 0.1 K/uL   Immature Granulocytes 1 %   Abs Immature Granulocytes 0.03 0.00 - 0.07 K/uL    Comment: Performed at [facility], [address] [place], [place] [address]  TSH     Status: None   Collection Time: 02/13/18  1:07 PM  Result Value Ref Range   TSH 1.402 0.350 - 4.500 uIU/mL    Comment: Performed by a 3rd Generation assay with a functional sensitivity of <=0.01 uIU/mL. Performed at [facility], [address] [place], [place] [address]    Objective  Body mass index is 32.23 kg/m. Wt Readings from Last 3 Encounters:  02/15/18 121 lb 9.6 oz (55.2 kg)  02/13/18 120 lb 4.8 oz (54.6 kg)  02/06/18 119 lb 9.6 oz (54.3 kg)   Temp Readings from Last 3 Encounters:  02/15/18 98.6 F (37 C) (Oral)  02/13/18 (!) 96.7 F (35.9 C)  01/23/18 (!) 97.4 F (36.3 C) (Tympanic)   BP Readings from Last 3  Encounters:  02/15/18 132/62  02/13/18 (!) 144/79  02/06/18 (!) 198/89   Pulse Readings from Last 3 Encounters:  02/15/18 88  02/13/18 92  02/06/18 96    Physical Exam  Constitutional: She is oriented to person, place, and time. Vital signs are normal. She appears well-developed and well-nourished. She is cooperative.  HENT:  Head: Normocephalic and atraumatic.  Mouth/Throat: Oropharynx is  clear and moist and mucous membranes are normal.  Eyes: Pupils are equal, round, and reactive to light. Conjunctivae are normal.  Cardiovascular: Normal rate, regular rhythm and normal heart sounds.  Pulmonary/Chest: Effort normal and breath sounds normal.  Neurological: She is alert and oriented to person, place, and time. Gait normal.  Skin: Skin is warm, dry and intact.     Psychiatric: She has a normal mood and affect. Her speech is normal and behavior is normal. Judgment and thought content normal. Cognition and memory are normal.  Nursing note and vitals reviewed.   Assessment   1. HTN controlled w/o meds  -s/p left renal artery stent with h/o RAS 2. Lung cancer with pleural mets IV, recurrently left upper lobe s/p left lower lobe resection on Alimta, Keytruda  3. HLD 4. Anxiety  5. Iron def anemia H/H 9.9/30.0  6. HM Plan   1. Prn hydralazine 25 tid prn but pt is not taking due to BP controlled w/o medications  2.f/u H/O #2 and #5 CT chest sch 02/04/18 3. Repeat fasting labs upcoming  4. paxil is helping with prn xanax 0.25 1/2 tab prn  5. See #2  H/o to f/u and monitor  6.  UTD vaccines consider shingrix in future  Flu shot had 12/26/17  Labs today CMET, CBC, anemia panel, T[facility] reviewed 02/13/18  Check UA and culture today h/o hematuria, kidney stone nonobstructing on the right and urgency of urination   Return for fasting labs A1C, lipid, B12 and vitamin D   cologaurd neg 1/15/19repeat in 3 years   Out of age window pap, mammo declines for now PET neg  12/06/17  DEXA T score neck -1.5 to -2.4 06/2010 may want to repeat in future ordered repeat DEXA  CT chest upcoming per H/o sch 02/24/18  Dermatology saw recently 01/2018/02/2018 with LN2 to left chest   Cardiology Dr. Fletcher Anon  Provider: Dr. Olivia Mackie McLean-Scocuzza-Internal Medicine

## 2018-02-15 NOTE — Progress Notes (Signed)
Pre visit review using our clinic review tool, if applicable. No additional management support is needed unless otherwise documented below in the visit note. 

## 2018-02-16 LAB — URINALYSIS, ROUTINE W REFLEX MICROSCOPIC
BILIRUBIN URINE: NEGATIVE
Glucose, UA: NEGATIVE
HGB URINE DIPSTICK: NEGATIVE
KETONES UR: NEGATIVE
Leukocytes, UA: NEGATIVE
NITRITE: NEGATIVE
Protein, ur: NEGATIVE
SPECIFIC GRAVITY, URINE: 1.013 (ref 1.001–1.03)
pH: 6 (ref 5.0–8.0)

## 2018-02-16 LAB — URINE CULTURE
MICRO NUMBER:: 91367081
RESULT: NO GROWTH
SPECIMEN QUALITY:: ADEQUATE

## 2018-02-17 ENCOUNTER — Ambulatory Visit (INDEPENDENT_AMBULATORY_CARE_PROVIDER_SITE_OTHER): Payer: Medicare HMO

## 2018-02-17 DIAGNOSIS — I701 Atherosclerosis of renal artery: Secondary | ICD-10-CM

## 2018-02-22 ENCOUNTER — Other Ambulatory Visit (INDEPENDENT_AMBULATORY_CARE_PROVIDER_SITE_OTHER): Payer: Medicare HMO

## 2018-02-22 DIAGNOSIS — E785 Hyperlipidemia, unspecified: Secondary | ICD-10-CM

## 2018-02-22 DIAGNOSIS — R739 Hyperglycemia, unspecified: Secondary | ICD-10-CM

## 2018-02-22 DIAGNOSIS — E538 Deficiency of other specified B group vitamins: Secondary | ICD-10-CM | POA: Diagnosis not present

## 2018-02-22 DIAGNOSIS — E559 Vitamin D deficiency, unspecified: Secondary | ICD-10-CM

## 2018-02-22 LAB — LIPID PANEL
CHOL/HDL RATIO: 3
Cholesterol: 194 mg/dL (ref 0–200)
HDL: 55.9 mg/dL (ref >39.00)
LDL CALC: 123 mg/dL — AB (ref 0–99)
NonHDL: 138.42
Triglycerides: 77 mg/dL (ref 0.0–149.0)
VLDL: 15.4 mg/dL (ref 0.0–40.0)

## 2018-02-22 LAB — VITAMIN B12: Vitamin B-12: 858 pg/mL (ref 211–911)

## 2018-02-22 LAB — VITAMIN D 25 HYDROXY (VIT D DEFICIENCY, FRACTURES): VITD: 87.62 ng/mL (ref 30.00–100.00)

## 2018-02-22 LAB — HEMOGLOBIN A1C: Hgb A1c MFr Bld: 6.1 % (ref 4.6–6.5)

## 2018-02-23 ENCOUNTER — Other Ambulatory Visit: Payer: Self-pay | Admitting: Oncology

## 2018-02-23 DIAGNOSIS — C3490 Malignant neoplasm of unspecified part of unspecified bronchus or lung: Secondary | ICD-10-CM

## 2018-02-24 ENCOUNTER — Ambulatory Visit
Admission: RE | Admit: 2018-02-24 | Discharge: 2018-02-24 | Disposition: A | Discharge status: Home / Self Care | Payer: Medicare HMO | Source: Ambulatory Visit | Attending: Oncology | Admitting: Oncology

## 2018-02-24 DIAGNOSIS — C3492 Malignant neoplasm of unspecified part of left bronchus or lung: Secondary | ICD-10-CM | POA: Insufficient documentation

## 2018-02-24 DIAGNOSIS — C3490 Malignant neoplasm of unspecified part of unspecified bronchus or lung: Secondary | ICD-10-CM | POA: Diagnosis not present

## 2018-02-27 ENCOUNTER — Telehealth: Payer: Self-pay | Admitting: *Deleted

## 2018-02-27 NOTE — Telephone Encounter (Signed)
Left a message for the patient to call back.  

## 2018-02-27 NOTE — Telephone Encounter (Signed)
Patient returning our call  Please call back

## 2018-02-27 NOTE — Telephone Encounter (Signed)
Spoke with patient on phone about renal ultrasound results that were taken on 02/17/18.  Per note from Ailene Rud, MD "renal artery duplex showed patent left renal artery stent." Pt verbalized understanding and was encouraged to call with any further questions or concerns.

## 2018-02-27 NOTE — Telephone Encounter (Signed)
-----   Message from Wellington Hampshire, MD sent at 02/23/2018  1:25 PM EST ----- Inform patient that renal artery duplex showed patent left renal artery stent.

## 2018-03-06 ENCOUNTER — Inpatient Hospital Stay (HOSPITAL_BASED_OUTPATIENT_CLINIC_OR_DEPARTMENT_OTHER): Payer: Medicare HMO | Admitting: Oncology

## 2018-03-06 ENCOUNTER — Encounter: Payer: Self-pay | Admitting: Oncology

## 2018-03-06 ENCOUNTER — Inpatient Hospital Stay: Payer: Medicare HMO

## 2018-03-06 ENCOUNTER — Inpatient Hospital Stay: Payer: Medicare HMO | Attending: Oncology

## 2018-03-06 ENCOUNTER — Other Ambulatory Visit: Payer: Self-pay

## 2018-03-06 VITALS — BP 155/83 | HR 97 | Temp 97.9°F | Resp 18 | Wt 119.5 lb

## 2018-03-06 DIAGNOSIS — R531 Weakness: Secondary | ICD-10-CM

## 2018-03-06 DIAGNOSIS — M129 Arthropathy, unspecified: Secondary | ICD-10-CM | POA: Insufficient documentation

## 2018-03-06 DIAGNOSIS — I2510 Atherosclerotic heart disease of native coronary artery without angina pectoris: Secondary | ICD-10-CM | POA: Insufficient documentation

## 2018-03-06 DIAGNOSIS — Z79899 Other long term (current) drug therapy: Secondary | ICD-10-CM | POA: Insufficient documentation

## 2018-03-06 DIAGNOSIS — E785 Hyperlipidemia, unspecified: Secondary | ICD-10-CM | POA: Diagnosis not present

## 2018-03-06 DIAGNOSIS — C3490 Malignant neoplasm of unspecified part of unspecified bronchus or lung: Secondary | ICD-10-CM

## 2018-03-06 DIAGNOSIS — T451X5A Adverse effect of antineoplastic and immunosuppressive drugs, initial encounter: Secondary | ICD-10-CM

## 2018-03-06 DIAGNOSIS — M545 Low back pain: Secondary | ICD-10-CM

## 2018-03-06 DIAGNOSIS — R5383 Other fatigue: Secondary | ICD-10-CM

## 2018-03-06 DIAGNOSIS — C782 Secondary malignant neoplasm of pleura: Secondary | ICD-10-CM | POA: Diagnosis not present

## 2018-03-06 DIAGNOSIS — Z87442 Personal history of urinary calculi: Secondary | ICD-10-CM | POA: Diagnosis not present

## 2018-03-06 DIAGNOSIS — Z5111 Encounter for antineoplastic chemotherapy: Secondary | ICD-10-CM | POA: Diagnosis not present

## 2018-03-06 DIAGNOSIS — D6481 Anemia due to antineoplastic chemotherapy: Secondary | ICD-10-CM

## 2018-03-06 DIAGNOSIS — I10 Essential (primary) hypertension: Secondary | ICD-10-CM | POA: Insufficient documentation

## 2018-03-06 DIAGNOSIS — G4700 Insomnia, unspecified: Secondary | ICD-10-CM | POA: Insufficient documentation

## 2018-03-06 DIAGNOSIS — Z7982 Long term (current) use of aspirin: Secondary | ICD-10-CM | POA: Insufficient documentation

## 2018-03-06 DIAGNOSIS — C3432 Malignant neoplasm of lower lobe, left bronchus or lung: Secondary | ICD-10-CM | POA: Insufficient documentation

## 2018-03-06 DIAGNOSIS — Z5112 Encounter for antineoplastic immunotherapy: Secondary | ICD-10-CM | POA: Insufficient documentation

## 2018-03-06 DIAGNOSIS — Z8742 Personal history of other diseases of the female genital tract: Secondary | ICD-10-CM

## 2018-03-06 DIAGNOSIS — C3492 Malignant neoplasm of unspecified part of left bronchus or lung: Secondary | ICD-10-CM

## 2018-03-06 DIAGNOSIS — K5900 Constipation, unspecified: Secondary | ICD-10-CM | POA: Insufficient documentation

## 2018-03-06 LAB — CBC WITH DIFFERENTIAL/PLATELET
Abs Immature Granulocytes: 0.03 10*3/uL (ref 0.00–0.07)
Basophils Absolute: 0.1 10*3/uL (ref 0.0–0.1)
Basophils Relative: 1 %
EOS ABS: 0.1 10*3/uL (ref 0.0–0.5)
EOS PCT: 2 %
HCT: 31.7 % — ABNORMAL LOW (ref 36.0–46.0)
Hemoglobin: 9.6 g/dL — ABNORMAL LOW (ref 12.0–15.0)
IMMATURE GRANULOCYTES: 0 %
LYMPHS PCT: 16 %
Lymphs Abs: 1.2 10*3/uL (ref 0.7–4.0)
MCH: 25.4 pg — ABNORMAL LOW (ref 26.0–34.0)
MCHC: 30.3 g/dL (ref 30.0–36.0)
MCV: 83.9 fL (ref 80.0–100.0)
Monocytes Absolute: 0.9 10*3/uL (ref 0.1–1.0)
Monocytes Relative: 12 %
NEUTROS PCT: 69 %
NRBC: 0 % (ref 0.0–0.2)
Neutro Abs: 4.9 10*3/uL (ref 1.7–7.7)
Platelets: 398 10*3/uL (ref 150–400)
RBC: 3.78 MIL/uL — AB (ref 3.87–5.11)
RDW: 15.1 % (ref 11.5–15.5)
WBC: 7.1 10*3/uL (ref 4.0–10.5)

## 2018-03-06 LAB — COMPREHENSIVE METABOLIC PANEL
ALBUMIN: 3.3 g/dL — AB (ref 3.5–5.0)
ALK PHOS: 101 U/L (ref 38–126)
ALT: 15 U/L (ref 0–44)
AST: 27 U/L (ref 15–41)
Anion gap: 9 (ref 5–15)
BILIRUBIN TOTAL: 0.3 mg/dL (ref 0.3–1.2)
BUN: 16 mg/dL (ref 8–23)
CALCIUM: 9.3 mg/dL (ref 8.9–10.3)
CO2: 27 mmol/L (ref 22–32)
CREATININE: 0.71 mg/dL (ref 0.44–1.00)
Chloride: 99 mmol/L (ref 98–111)
GFR calc Af Amer: >60 mL/min (ref >60)
GFR calc non Af Amer: >60 mL/min (ref >60)
Glucose, Bld: 137 mg/dL — ABNORMAL HIGH (ref 70–99)
POTASSIUM: 3.8 mmol/L (ref 3.5–5.1)
Sodium: 135 mmol/L (ref 135–145)
Total Protein: 7 g/dL (ref 6.5–8.1)

## 2018-03-06 MED ORDER — CYANOCOBALAMIN 1000 MCG/ML IJ SOLN
1000.0000 ug | Freq: Once | INTRAMUSCULAR | Status: AC
  Administered 2018-03-06: 1000 ug via INTRAMUSCULAR

## 2018-03-06 MED ORDER — HEPARIN SOD (PORK) LOCK FLUSH 100 UNIT/ML IV SOLN
INTRAVENOUS | Status: AC
  Filled 2018-03-06: qty 5

## 2018-03-06 MED ORDER — SODIUM CHLORIDE 0.9 % IV SOLN
Freq: Once | INTRAVENOUS | Status: AC
  Administered 2018-03-06: 14:00:00 via INTRAVENOUS
  Filled 2018-03-06: qty 250

## 2018-03-06 MED ORDER — ONDANSETRON HCL 4 MG/2ML IJ SOLN
8.0000 mg | Freq: Once | INTRAMUSCULAR | Status: AC
  Administered 2018-03-06: 8 mg via INTRAVENOUS
  Filled 2018-03-06: qty 4

## 2018-03-06 MED ORDER — SODIUM CHLORIDE 0.9 % IV SOLN
200.0000 mg | Freq: Once | INTRAVENOUS | Status: AC
  Administered 2018-03-06: 200 mg via INTRAVENOUS
  Filled 2018-03-06: qty 8

## 2018-03-06 MED ORDER — DEXAMETHASONE SODIUM PHOSPHATE 10 MG/ML IJ SOLN
10.0000 mg | Freq: Once | INTRAMUSCULAR | Status: AC
  Administered 2018-03-06: 10 mg via INTRAVENOUS
  Filled 2018-03-06: qty 1

## 2018-03-06 MED ORDER — SODIUM CHLORIDE 0.9 % IV SOLN
700.0000 mg | Freq: Once | INTRAVENOUS | Status: AC
  Administered 2018-03-06: 700 mg via INTRAVENOUS
  Filled 2018-03-06: qty 20

## 2018-03-06 NOTE — Progress Notes (Signed)
Hematology/Oncology follow up note [facility] [phone] Fax:(336) [phone]   Patient Care Team: McLean-Scocuzza, Nino Glow, MD as PCP - General (Internal Medicine) Wellington Hampshire, MD as Consulting Physician (Cardiology) Telford Nab, RN as Registered Nurse  REFERRING PROVIDER: [facility] VISIT  follow-up for assessment after chemotherapy treatment for stage IV lung adenocarcinoma. HISTORY OF PRESENTING ILLNESS:  Tamara Burton is a  84 y.o.  female with PMH listed below who was referred to me for evaluation of lung cancer.  Patient reports a remote history of lung cancer in 2003 and status post left lower lobectomy.  We obtained medical  records from [facility] in [place].  Reviewed pathology results.  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  Perihilar lymph node negative.  Peri-bronchial lymph node negative. Patient is accompanied by her friend who is her house health power of attorney Patient has noticed mild chronic left side chest wall pain which she described as a stitch, for about 6 months to a year.  Pain is very mild and she takes Tylenol or tramadol as needed.  Pain got little worse early this year. 07/15/2017 CT chest with contrast showed new left pleural process since prior CT from 2016.  Findings most suspicious for pleural tumor with complex pleural fluid.  Medial left upper lobe atelectasis without obvious tumor. 08/16/2017 PET scan showed multifocal left side pleural hypermetabolic is him and a soft tissue thickening, consistent with pleural metastasis Hyper metabolic soft tissue density within the lingula, suspicious for primary bronchogenic carcinoma.   There is also left adrenal hypermetabolic them is nonspecific and without CT correlate.  Otherwise no evidence of extra thoracic metastatic  disease.  Right nephrolithiasis.  # 08/26/2017 CT biopsy of left lower chest pleura showed positive for adenocarcinoma.   Patient's case was discussed on tumor board on Sep 01, 2017.  Per pathology, there is no enough tissue for foundation one testing or Omniseq.  Patient was referred by pulmonology Dr. Shawna Orleans to me to discuss about management plan.  INTERVAL HISTORY Tamara Burton is a 84 y.o. female who has above history reviewed by me today presents for assessment prior to cycle 7 chemotherapy with maintenance Alimta and immunotherapy.   # Chemotherapy and immunotherapy. Reports tolerating well.  Fatigue is chronic, at baseline.  Appetite is fair. Taste of food is not good.  Continues to be active. Exercise daily.  #left anterior chest wall aches. Stable.  Lost one pound.   #Oncology treatment Status post 4 cycles of carbo/Alimta/Keytruda [based on Keynote 189 study, triplet improved ORR, PFS, and OS]. Starting cycle 5, patient was started on Alimta and Keytruda maintenance.  Review of Systems  Constitutional: Positive for malaise/fatigue. Negative for chills, fever and weight loss.  HENT: Negative for congestion, ear discharge, ear pain, nosebleeds, sinus pain and sore throat.   Eyes: Negative for double vision, photophobia, pain, discharge and redness.  Respiratory: Negative for cough, hemoptysis, sputum production, shortness of breath and wheezing.   Cardiovascular: Negative for chest pain, palpitations, orthopnea, claudication and leg swelling.  Gastrointestinal: Negative for abdominal pain, blood in stool, constipation, diarrhea, heartburn, melena, nausea and vomiting.  Genitourinary: Negative for dysuria, flank pain, frequency and hematuria.  Musculoskeletal: Negative for back pain, myalgias and neck pain.  Skin: Negative for itching and rash.  Neurological: Negative for dizziness, tingling, tremors, focal weakness, weakness and headaches.  Endo/Heme/Allergies: Negative  for  environmental allergies. Does not bruise/bleed easily.  Psychiatric/Behavioral: Negative for depression and hallucinations. The patient is not nervous/anxious.     MEDICAL HISTORY:  Past Medical History:  Diagnosis Date  . Arthritis    "qwhere"  . Carotid artery disease ([place])   . Chronic lower back pain   . Colon polyps    benign per pt   . Epistaxis    with use of nasal sprays   . Headache    "lately; related to HTN" (04/24/2014)  . Hyperlipidemia   . Hypertension   . Hyponatremia    h/o  . Kidney stone   . Lung cancer ([place]) 2003   s/p left lower lobe resection  . Non-obstructive CAD    a. 04/2014 Cath: LM nl, LAD/D1/LCX/RCA min irregs, RPDA/PAV nl.  . Orthostatic hypotension   . OSA on CPAP    "wear it intermittently"  . PONV (postoperative nausea and vomiting)    "when I was younger"  . Renal artery stenosis ([place])    a. 04/2014 Renal angiography: LRA 95p, RRA 40ost. PTA of LRA deferred 2/2 guide cath induced Ao dissection;  01/27 6 x 15 mm Herculink stent to the Left-RA  follows with [facility] Dr. Ronalee Belts  . Sinus headache     SURGICAL HISTORY: Past Surgical History:  Procedure Laterality Date  . APPENDECTOMY    . BILATERAL OOPHORECTOMY Bilateral 2000's  . CARDIAC CATHETERIZATION  04/24/2014  . CATARACT EXTRACTION W/ INTRAOCULAR LENS  IMPLANT, BILATERAL Bilateral   . JOINT REPLACEMENT    . LEFT HEART CATHETERIZATION WITH CORONARY ANGIOGRAM N/A 04/24/2014   Procedure: LEFT HEART CATHETERIZATION WITH CORONARY ANGIOGRAM;  Surgeon: Wellington Hampshire, MD;  Location: [facility] CATH LAB;  Service: Cardiovascular;  Laterality: N/A;  . LUNG LOBECTOMY Left 2003   "lower lobe"  . PERIPHERAL VASCULAR CATHETERIZATION  05/01/2014   Procedure: RENAL INTERVENTION;  Surgeon: Wellington Hampshire, MD; 6 x 15 mm Herculink stent to the Left Renal Artery  . PORTA CATH INSERTION N/A 09/26/2017   Procedure: PORTA CATH INSERTION;  Surgeon: Algernon Huxley, MD;  Location: [facility] CV LAB;  Service:  Cardiovascular;  Laterality: N/A;  . RENAL ANGIOGRAM  04/24/2014  . RENAL ANGIOGRAM N/A 04/24/2014   Procedure: RENAL ANGIOGRAM;  Surgeon: Wellington Hampshire, MD;  Location: [facility] CATH LAB;  Service: Cardiovascular;  Laterality: N/A;  . RENAL ANGIOGRAM Left 05/01/2014   Procedure: RENAL ANGIOGRAM;  Surgeon: Wellington Hampshire, MD;  Location: [facility] CATH LAB;  Service: Cardiovascular;  Laterality: Left;  . REPLACEMENT TOTAL KNEE Left   . TOTAL HIP ARTHROPLASTY Right   . TUBAL LIGATION  ~ 1975  . VAGINAL DELIVERY  X 2  . VAGINAL HYSTERECTOMY  1980's    SOCIAL HISTORY: Social History   Socioeconomic History  . Marital status: Widowed    Spouse name: Not on file  . Number of children: Not on file  . Years of education: Not on file  . Highest education level: Not on file  Occupational History  . Not on file  Social Needs  . Financial resource strain: Not hard at all  . Food insecurity:    Worry: Never true    Inability: Never true  . Transportation needs:    Medical: No    Non-medical: No  Tobacco Use  . Smoking status: Never Smoker  . Smokeless tobacco: Never Used  . Tobacco comment: Father and husband both smokers  Substance and Sexual Activity  . Alcohol use: Yes  Comment: 04/24/2014 "glass of beer or wine q couple weeks"  . Drug use: No  . Sexual activity: Not on file  Lifestyle  . Physical activity:    Days per week: Not on file    Minutes per session: Not on file  . Stress: Not on file  Relationships  . Social connections:    Talks on phone: Not on file    Gets together: Not on file    Attends religious service: Not on file    Active member of club or organization: Not on file    Attends meetings of clubs or organizations: Not on file    Relationship status: Not on file  . Intimate partner violence:    Fear of current or ex partner: Not on file    Emotionally abused: Not on file    Physically abused: Not on file    Forced sexual activity: Not on file  Other Topics  Concern  . Not on file  Social History Narrative   Lives in [place]. Daughter nearby. Used to live in [place]   Normal ADLs as of 02/2017 and no h/o falls    Likes to walk   Lives alone     FAMILY HISTORY: Family History  Problem Relation Age of Onset  . Parkinsonism Mother   . Alzheimer's disease Mother   . Sarcoidosis Brother   . Stroke Maternal Grandmother   . Stomach cancer Paternal Grandmother   . Diabetes Son   . Diabetes Daughter     ALLERGIES:  is allergic to atorvastatin; penicillins; sulfa antibiotics; and amlodipine.  MEDICATIONS:  Current Outpatient Medications  Medication Sig Dispense Refill  . ALPRAZolam (XANAX) 0.25 MG tablet Take 0.25 mg by mouth daily as needed for anxiety. Take 1/2 tablet qd prn    . Ascorbic Acid (VITAMIN C CR) 1500 MG TBCR Take 1,500 mg by mouth daily.     Marland Kitchen aspirin 81 MG tablet Take 81 mg by mouth daily.    . Biotin 2500 MCG CAPS Take 1 capsule by mouth daily.    . Cholecalciferol (VITAMIN D3) 5000 units CAPS Take 1 capsule by mouth daily.    . Coenzyme Q10 (CO Q-10) 200 MG CAPS Take 1 capsule by mouth 2 (two) times daily.    Marland Kitchen docusate sodium (COLACE) 100 MG capsule TAKE 1CAP BY MOUTH DAILY AS NEEDED FOR MILD/MODERATE CONSTIPATION *DONT TAKE IF LOOSE STOOLS* 30 capsule 2  . folic acid (FOLVITE) 1 MG tablet TAKE 1 TABLET BY MOUTH EVERY DAY 30 tablet 3  . glucosamine-chondroitin 500-400 MG tablet Take 1 tablet by mouth 2 (two) times daily.    . hydrALAZINE (APRESOLINE) 25 MG tablet Take 1 tablet (25 mg total) by mouth 3 (three) times daily. (Patient taking differently: Take 25 mg by mouth 3 (three) times daily as needed. ) 90 tablet 6  . lidocaine-prilocaine (EMLA) cream Apply to affected area once 30 g 3  . Magnesium 500 MG CAPS Take 1 capsule by mouth daily.    . ondansetron (ZOFRAN) 8 MG tablet TAKE 1 TABLET BY MOUTH TWICE A DAY STARTING DAY AFTER CHEMO FOR 3 DAYS THEN AS NEEDED 21 tablet 3  . PARoxetine (PAXIL) 10 MG tablet  TAKE 1 TABLET BY MOUTH EVERY DAY 30 tablet 1  . prochlorperazine (COMPAZINE) 10 MG tablet Take 1 tablet (10 mg total) by mouth every 6 (six) hours as needed (Nausea or vomiting). 30 tablet 1  . Red Yeast Rice Extract (RED YEAST RICE PO)  Take 1,200 mg by mouth 2 (two) times daily.    . traMADol (ULTRAM) 50 MG tablet Take 1 tablet (50 mg total) by mouth daily as needed. For back pain/joint pain (Patient taking differently: Take 50 mg by mouth daily as needed for moderate pain. For back pain/joint pain) 90 tablet 0  . zinc gluconate 50 MG tablet Take 50 mg by mouth daily.     No current facility-administered medications for this visit.      PHYSICAL EXAMINATION: ECOG PERFORMANCE STATUS: 1 - Symptomatic but completely ambulatory Vitals:   03/06/18 1333  BP: (!) 155/83  Pulse: 97  Resp: 18  Temp: 97.9 F (36.6 C)  SpO2: 98%   Filed Weights   03/06/18 1333  Weight: 119 lb 8 oz (54.2 kg)    Physical Exam  Constitutional: She is oriented to person, place, and time. No distress.  HENT:  Head: Normocephalic and atraumatic.  Mouth/Throat: Oropharynx is clear and moist.  Eyes: Pupils are equal, round, and reactive to light. Conjunctivae and EOM are normal. No scleral icterus.  Neck: Normal range of motion. Neck supple.  Cardiovascular: Normal rate, regular rhythm and normal heart sounds.  Pulmonary/Chest: Effort normal and breath sounds normal. No respiratory distress. She has no wheezes. She has no rales. She exhibits no tenderness.  Decreased breath sounds on the left lower lung base  Abdominal: Soft. Bowel sounds are normal. She exhibits no distension and no mass. There is no tenderness.  Musculoskeletal: Normal range of motion. She exhibits no edema or deformity.  Lymphadenopathy:    She has no cervical adenopathy.  Neurological: She is alert and oriented to person, place, and time. No cranial nerve deficit. Coordination normal.  Skin: Skin is warm and dry. No rash noted. No  erythema.  Psychiatric: She has a normal mood and affect. Her behavior is normal. Thought content normal.     LABORATORY DATA:  I have reviewed the data as listed Lab Results  Component Value Date   WBC 7.1 03/06/2018   HGB 9.6 (L) 03/06/2018   HCT 31.7 (L) 03/06/2018   MCV 83.9 03/06/2018   PLT 398 03/06/2018   Recent Labs    01/23/18 0821 02/13/18 1307 03/06/18 1316  NA 140 135 135  K 3.8 3.7 3.8  CL 104 99 99  CO2 28 27 27   GLUCOSE 138* 135* 137*  BUN 14 15 16   CREATININE 0.67 0.86 0.71  CALCIUM 9.1 8.9 9.3  GFRNONAA >60 >60 >60  GFRAA >60 >60 >60  PROT 6.4* 6.4* 7.0  ALBUMIN 3.1* 3.2* 3.3*  AST 25 25 27   ALT 15 12 15   ALKPHOS 93 76 101  BILITOT 0.3 0.2* 0.3   RADIOGRAPHIC STUDIES: I have personally reviewed the radiological images as listed and agreed with the findings in the report. 12/06/2017 PET scan was independently reviewed by me and discussed with patient and her friends.  PET scan showed notable reduced activity in the pleural metastatic disease on the left.  Reduce lingular mass activity.  There was a loculated left pleural effusion has moderately enlarged.  Currently no definite adrenal hypermetabolic activity.  Chronic findings include aortic atherosclerosis, coronary atherosclerosis, nonobstructing right nephrolithiasis.  Dense mitral calcification    ASSESSMENT & PLAN:  84 year old female who has a history of left lower lobe primary lung adenocarcinoma present for evaluation of newly diagnosed 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 1. 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 Malignant neoplasm of left lung, unspecified part of lung (  [place])   3. Encounter for antineoplastic immunotherapy   4. Encounter for antineoplastic chemotherapy   Cancer Staging Malignant neoplasm of unspecified part of unspecified bronchus or lung ([place]) Staging form: Lung, AJCC 8th Edition - Clinical stage from 09/02/2017: Stage IV (cT2a, cN0, pM1a) - Signed by Earlie Server, MD on 09/02/2017  #Stage IV left lower lobe primary lung adenocarcinoma with pleural involvement. CT chest was independently reviewed and discussed with patient. Lung cancer stable disease.  Tolerates maintenance Alimta and keytruda well.  Labs reviewed and discussed with patient.  Counts are acceptable to proceed with today's Alimta and Keytruda treatment today.  Continue Folic acid daily.  She will receive one dose of Vitamin B12 injection today.   #Normocytic anemia, due to chemotherapy. Hemoglobin stable.  # Anexiety, well controlled. Continue Paxil 10mg  daily.  # Fatigue stable.   We spent sufficient time to discuss many aspect of care, questions were answered to patient's satisfaction.  Return of visit:  3 weeks for maintenance chemotherapy and immunotherapy.  Earlie Server, MD, PhD Hematology Oncology [facility] at [facility] Pager- [phone] 03/06/2018

## 2018-03-06 NOTE — Progress Notes (Signed)
Patient here for follow up

## 2018-03-24 ENCOUNTER — Other Ambulatory Visit: Payer: Self-pay | Admitting: Oncology

## 2018-03-27 ENCOUNTER — Inpatient Hospital Stay: Payer: Medicare HMO

## 2018-03-27 ENCOUNTER — Encounter: Payer: Self-pay | Admitting: Oncology

## 2018-03-27 ENCOUNTER — Inpatient Hospital Stay (HOSPITAL_BASED_OUTPATIENT_CLINIC_OR_DEPARTMENT_OTHER): Payer: Medicare HMO | Admitting: Oncology

## 2018-03-27 ENCOUNTER — Other Ambulatory Visit: Payer: Self-pay

## 2018-03-27 VITALS — BP 159/75 | HR 92 | Temp 95.3°F | Resp 16 | Ht <= 58 in | Wt 119.0 lb

## 2018-03-27 DIAGNOSIS — C3490 Malignant neoplasm of unspecified part of unspecified bronchus or lung: Secondary | ICD-10-CM

## 2018-03-27 DIAGNOSIS — Z5111 Encounter for antineoplastic chemotherapy: Secondary | ICD-10-CM

## 2018-03-27 DIAGNOSIS — K5900 Constipation, unspecified: Secondary | ICD-10-CM | POA: Diagnosis not present

## 2018-03-27 DIAGNOSIS — Z5112 Encounter for antineoplastic immunotherapy: Secondary | ICD-10-CM | POA: Diagnosis not present

## 2018-03-27 DIAGNOSIS — R5383 Other fatigue: Secondary | ICD-10-CM | POA: Diagnosis not present

## 2018-03-27 DIAGNOSIS — T451X5A Adverse effect of antineoplastic and immunosuppressive drugs, initial encounter: Secondary | ICD-10-CM

## 2018-03-27 DIAGNOSIS — E785 Hyperlipidemia, unspecified: Secondary | ICD-10-CM

## 2018-03-27 DIAGNOSIS — I10 Essential (primary) hypertension: Secondary | ICD-10-CM

## 2018-03-27 DIAGNOSIS — C782 Secondary malignant neoplasm of pleura: Secondary | ICD-10-CM

## 2018-03-27 DIAGNOSIS — C3492 Malignant neoplasm of unspecified part of left bronchus or lung: Secondary | ICD-10-CM

## 2018-03-27 DIAGNOSIS — G4700 Insomnia, unspecified: Secondary | ICD-10-CM

## 2018-03-27 DIAGNOSIS — Z79899 Other long term (current) drug therapy: Secondary | ICD-10-CM

## 2018-03-27 DIAGNOSIS — M129 Arthropathy, unspecified: Secondary | ICD-10-CM

## 2018-03-27 DIAGNOSIS — I2510 Atherosclerotic heart disease of native coronary artery without angina pectoris: Secondary | ICD-10-CM

## 2018-03-27 DIAGNOSIS — R531 Weakness: Secondary | ICD-10-CM | POA: Diagnosis not present

## 2018-03-27 DIAGNOSIS — D6481 Anemia due to antineoplastic chemotherapy: Secondary | ICD-10-CM | POA: Diagnosis not present

## 2018-03-27 DIAGNOSIS — C3432 Malignant neoplasm of lower lobe, left bronchus or lung: Secondary | ICD-10-CM

## 2018-03-27 DIAGNOSIS — Z7982 Long term (current) use of aspirin: Secondary | ICD-10-CM

## 2018-03-27 DIAGNOSIS — Z87442 Personal history of urinary calculi: Secondary | ICD-10-CM

## 2018-03-27 DIAGNOSIS — M545 Low back pain: Secondary | ICD-10-CM

## 2018-03-27 LAB — CBC WITH DIFFERENTIAL/PLATELET
Abs Immature Granulocytes: 0.02 10*3/uL (ref 0.00–0.07)
BASOS PCT: 1 %
Basophils Absolute: 0 10*3/uL (ref 0.0–0.1)
Eosinophils Absolute: 0.1 10*3/uL (ref 0.0–0.5)
Eosinophils Relative: 2 %
HCT: 32.9 % — ABNORMAL LOW (ref 36.0–46.0)
HEMOGLOBIN: 10.2 g/dL — AB (ref 12.0–15.0)
Immature Granulocytes: 0 %
Lymphocytes Relative: 17 %
Lymphs Abs: 1.2 10*3/uL (ref 0.7–4.0)
MCH: 25.4 pg — ABNORMAL LOW (ref 26.0–34.0)
MCHC: 31 g/dL (ref 30.0–36.0)
MCV: 81.8 fL (ref 80.0–100.0)
Monocytes Absolute: 0.9 10*3/uL (ref 0.1–1.0)
Monocytes Relative: 12 %
NEUTROS ABS: 5 10*3/uL (ref 1.7–7.7)
Neutrophils Relative %: 68 %
Platelets: 395 10*3/uL (ref 150–400)
RBC: 4.02 MIL/uL (ref 3.87–5.11)
RDW: 16 % — ABNORMAL HIGH (ref 11.5–15.5)
WBC: 7.2 10*3/uL (ref 4.0–10.5)
nRBC: 0 % (ref 0.0–0.2)

## 2018-03-27 LAB — COMPREHENSIVE METABOLIC PANEL
ALT: 16 U/L (ref 0–44)
AST: 28 U/L (ref 15–41)
Albumin: 3.4 g/dL — ABNORMAL LOW (ref 3.5–5.0)
Alkaline Phosphatase: 104 U/L (ref 38–126)
Anion gap: 11 (ref 5–15)
BUN: 13 mg/dL (ref 8–23)
CO2: 27 mmol/L (ref 22–32)
CREATININE: 0.57 mg/dL (ref 0.44–1.00)
Calcium: 9.1 mg/dL (ref 8.9–10.3)
Chloride: 101 mmol/L (ref 98–111)
GFR calc Af Amer: >60 mL/min (ref >60)
GFR calc non Af Amer: >60 mL/min (ref >60)
Glucose, Bld: 101 mg/dL — ABNORMAL HIGH (ref 70–99)
Potassium: 3.7 mmol/L (ref 3.5–5.1)
SODIUM: 139 mmol/L (ref 135–145)
Total Bilirubin: 0.4 mg/dL (ref 0.3–1.2)
Total Protein: 7.1 g/dL (ref 6.5–8.1)

## 2018-03-27 LAB — TSH: TSH: 1.551 u[IU]/mL (ref 0.350–4.500)

## 2018-03-27 MED ORDER — HEPARIN SOD (PORK) LOCK FLUSH 100 UNIT/ML IV SOLN: 500.0000 [IU] | Freq: Once | INTRAVENOUS | Status: DC | PRN

## 2018-03-27 MED ORDER — HEPARIN SOD (PORK) LOCK FLUSH 100 UNIT/ML IV SOLN
500.0000 [IU] | Freq: Once | INTRAVENOUS | Status: AC
  Administered 2018-03-27: 500 [IU] via INTRAVENOUS
  Filled 2018-03-27: qty 5

## 2018-03-27 MED ORDER — SODIUM CHLORIDE 0.9 % IV SOLN
200.0000 mg | Freq: Once | INTRAVENOUS | Status: AC
  Administered 2018-03-27: 200 mg via INTRAVENOUS
  Filled 2018-03-27: qty 8

## 2018-03-27 MED ORDER — SODIUM CHLORIDE 0.9 % IV SOLN
700.0000 mg | Freq: Once | INTRAVENOUS | Status: AC
  Administered 2018-03-27: 700 mg via INTRAVENOUS
  Filled 2018-03-27: qty 20

## 2018-03-27 MED ORDER — SODIUM CHLORIDE 0.9% FLUSH
10.0000 mL | Freq: Once | INTRAVENOUS | Status: AC
  Administered 2018-03-27: 10 mL via INTRAVENOUS
  Filled 2018-03-27: qty 10

## 2018-03-27 MED ORDER — DEXAMETHASONE SODIUM PHOSPHATE 10 MG/ML IJ SOLN
10.0000 mg | Freq: Once | INTRAMUSCULAR | Status: AC
  Administered 2018-03-27: 10 mg via INTRAVENOUS
  Filled 2018-03-27: qty 1

## 2018-03-27 MED ORDER — ONDANSETRON HCL 4 MG/2ML IJ SOLN
8.0000 mg | Freq: Once | INTRAMUSCULAR | Status: AC
  Administered 2018-03-27: 8 mg via INTRAVENOUS
  Filled 2018-03-27: qty 4

## 2018-03-27 MED ORDER — SODIUM CHLORIDE 0.9 % IV SOLN
Freq: Once | INTRAVENOUS | Status: AC
  Administered 2018-03-27: 12:00:00 via INTRAVENOUS
  Filled 2018-03-27: qty 250

## 2018-03-27 NOTE — Progress Notes (Signed)
Hematology/Oncology follow up note [facility] Telephone:([phone]) [phone] [phone]) [phone]   Patient Care Team: McLean-Scocuzza, Nino Glow, MD as PCP - General (Internal Medicine) Wellington Hampshire, MD as Consulting Physician (Cardiology) Telford Nab, RN as Registered Nurse  REFERRING PROVIDER: Dr.Simonds  REASON FOR VISIT Follow-up for assessment prior to cycle 8  HISTORY OF PRESENTING ILLNESS:  Tamara Burton is a  84 y.o.  female with PMH listed below who was referred to me for evaluation of lung cancer.  Patient reports a remote history of lung cancer in 2003 and status post left lower lobectomy.  We obtained medical  records from [facility] in [place].  Reviewed pathology results.  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  Perihilar lymph node negative.  Peri-bronchial lymph node negative. Patient is accompanied by her friend who is her house health power of attorney Patient has noticed mild chronic left side chest wall pain which she described as a stitch, for about 6 months to a year.  Pain is very mild and she takes Tylenol or tramadol as needed.  Pain got little worse early this year. 07/15/2017 CT chest with contrast showed new left pleural process since prior CT from 2016.  Findings most suspicious for pleural tumor with complex pleural fluid.  Medial left upper lobe atelectasis without obvious tumor. 08/16/2017 PET scan showed multifocal left side pleural hypermetabolic is him and a soft tissue thickening, consistent with pleural metastasis Hyper metabolic soft tissue density within the lingula, suspicious for primary bronchogenic carcinoma.   There is also left adrenal hypermetabolic them is nonspecific and without CT correlate.  Otherwise no evidence of extra thoracic metastatic disease.  Right nephrolithiasis.  # 08/26/2017  CT biopsy of left lower chest pleura showed positive for adenocarcinoma.   Patient's case was discussed on tumor board on Sep 01, 2017.  Per pathology, there is no enough tissue for foundation one testing or Omniseq.  Patient was referred by pulmonology Dr. Shawna Orleans to me to discuss about management plan.  INTERVAL HISTORY Patient presents with above history for assessment prior to cycle 9 chemotherapy with maintenance Alimta and immunotherapy.  Appears to be tolerating well.  Complains of fatigue that is chronic.  Her appetite is good.  She continues to be very active and exercises daily.  Has intermittent constipation that is resolved with OTC Metamucil and stool softeners.  Typically problematic approximately 1 to 2 weeks after chemotherapy.  She continues her folic acid as prescribed.  Eating organ meats including liver and heart to help with iron levels.  She is currently not taking any blood pressure or cholesterol medication d/t side effects.  #Oncology treatment Status post 4 cycles of carbo/Alimta/Keytruda [based on Keynote 189 study, triplet improved ORR, PFS, and OS]. Currently on cycle 9 of Alimta and Keytruda maintenance.  Review of Systems  Constitutional: Positive for malaise/fatigue. Negative for chills, fever and weight loss.  HENT: Negative for congestion, ear pain and tinnitus.   Eyes: Negative.  Negative for blurred vision and double vision.  Respiratory: Negative.  Negative for cough, sputum production and shortness of breath.   Cardiovascular: Negative.  Negative for chest pain, palpitations and leg swelling.  Gastrointestinal: Positive for constipation. Negative for abdominal pain, diarrhea, nausea and vomiting.  Genitourinary: Negative for dysuria, frequency and urgency.  Musculoskeletal: Negative for back pain and falls.  Skin: Negative.  Negative for rash.  Neurological: Negative.  Negative  for weakness and headaches.  Endo/Heme/Allergies: Negative.  Does not  bruise/bleed easily.  Psychiatric/Behavioral: Negative.  Negative for depression. The patient is not nervous/anxious and does not have insomnia.     MEDICAL HISTORY:  Past Medical History:  Diagnosis Date  . Arthritis    "qwhere"  . Carotid artery disease ([place])   . Chronic lower back pain   . Colon polyps    benign per pt   . Epistaxis    with use of nasal sprays   . Headache    "lately; related to HTN" (04/24/2014)  . Hyperlipidemia   . Hypertension   . Hyponatremia    h/o  . Kidney stone   . Lung cancer ([place]) 2003   s/p left lower lobe resection  . Non-obstructive CAD    a. 04/2014 Cath: LM nl, LAD/D1/LCX/RCA min irregs, RPDA/PAV nl.  . Orthostatic hypotension   . OSA on CPAP    "wear it intermittently"  . PONV (postoperative nausea and vomiting)    "when I was younger"  . Renal artery stenosis ([place])    a. 04/2014 Renal angiography: LRA 95p, RRA 40ost. PTA of LRA deferred 2/2 guide cath induced Ao dissection;  01/27 6 x 15 mm Herculink stent to the Left-RA  follows with [facility] Dr. Ronalee Belts  . Sinus headache     SURGICAL HISTORY: Past Surgical History:  Procedure Laterality Date  . APPENDECTOMY    . BILATERAL OOPHORECTOMY Bilateral 2000's  . CARDIAC CATHETERIZATION  04/24/2014  . CATARACT EXTRACTION W/ INTRAOCULAR LENS  IMPLANT, BILATERAL Bilateral   . JOINT REPLACEMENT    . LEFT HEART CATHETERIZATION WITH CORONARY ANGIOGRAM N/A 04/24/2014   Procedure: LEFT HEART CATHETERIZATION WITH CORONARY ANGIOGRAM;  Surgeon: Wellington Hampshire, MD;  Location: [place] CATH LAB;  Service: Cardiovascular;  Laterality: N/A;  . LUNG LOBECTOMY Left 2003   "lower lobe"  . PERIPHERAL VASCULAR CATHETERIZATION  05/01/2014   Procedure: RENAL INTERVENTION;  Surgeon: Wellington Hampshire, MD; 6 x 15 mm Herculink stent to the Left Renal Artery  . PORTA CATH INSERTION N/A 09/26/2017   Procedure: PORTA CATH INSERTION;  Surgeon: Algernon Huxley, MD;  Location: [facility];  Service: Cardiovascular;   Laterality: N/A;  . RENAL ANGIOGRAM  04/24/2014  . RENAL ANGIOGRAM N/A 04/24/2014   Procedure: RENAL ANGIOGRAM;  Surgeon: Wellington Hampshire, MD;  Location: [facility] CATH [facility];  Service: Cardiovascular;  Laterality: N/A;  . RENAL ANGIOGRAM Left 05/01/2014   Procedure: RENAL ANGIOGRAM;  Surgeon: Wellington Hampshire, MD;  Location: [place] CATH LAB;  Service: Cardiovascular;  Laterality: Left;  . REPLACEMENT TOTAL KNEE Left   . TOTAL HIP ARTHROPLASTY Right   . TUBAL LIGATION  ~ 1975  . VAGINAL DELIVERY  X 2  . VAGINAL HYSTERECTOMY  1980's    SOCIAL HISTORY: Social History   Socioeconomic History  . Marital status: Widowed    Spouse name: Not on file  . Number of children: Not on file  . Years of education: Not on file  . Highest education level: Not on file  Occupational History  . Not on file  Social Needs  . Financial resource strain: Not hard at all  . Food insecurity:    Worry: Never true    Inability: Never true  . Transportation needs:    Medical: No    Non-medical: No  Tobacco Use  . Smoking status: Never Smoker  . Smokeless tobacco: Never Used  . Tobacco comment: Father and husband both smokers  Substance and Sexual Activity  . Alcohol use: Yes    Comment: 04/24/2014 "glass of beer or wine q couple weeks"  . Drug use: No  . Sexual activity: Not on file  Lifestyle  . Physical activity:    Days per week: Not on file    Minutes per session: Not on file  . Stress: Not on file  Relationships  . Social connections:    Talks on phone: Not on file    Gets together: Not on file    Attends religious service: Not on file    Active member of club or organization: Not on file    Attends meetings of clubs or organizations: Not on file    Relationship status: Not on file  . Intimate partner violence:    Fear of current or ex partner: Not on file    Emotionally abused: Not on file    Physically abused: Not on file    Forced sexual activity: Not on file  Other Topics Concern  . Not on  file  Social History Narrative   Lives in [place]. Daughter nearby. Used to live in [place]   Normal ADLs as of 02/2017 and no h/o falls    Likes to walk   Lives alone     FAMILY HISTORY: Family History  Problem Relation Age of Onset  . Parkinsonism Mother   . Alzheimer's disease Mother   . Sarcoidosis Brother   . Stroke Maternal Grandmother   . Stomach cancer Paternal Grandmother   . Diabetes Son   . Diabetes Daughter     ALLERGIES:  is allergic to atorvastatin; penicillins; sulfa antibiotics; and amlodipine.  MEDICATIONS:  Current Outpatient Medications  Medication Sig Dispense Refill  . ALPRAZolam (XANAX) 0.25 MG tablet Take 0.25 mg by mouth daily as needed for anxiety. Take 1/2 tablet qd prn    . Ascorbic Acid (VITAMIN C CR) 1500 MG TBCR Take 1,500 mg by mouth daily.     Marland Kitchen aspirin 81 MG tablet Take 81 mg by mouth daily.    . Biotin 2500 MCG CAPS Take 1 capsule by mouth daily.    . Cholecalciferol (VITAMIN D3) 5000 units CAPS Take 1 capsule by mouth daily.    . Coenzyme Q10 (CO Q-10) 200 MG CAPS Take 1 capsule by mouth 2 (two) times daily.    Marland Kitchen docusate sodium (COLACE) 100 MG capsule TAKE 1CAP BY MOUTH DAILY AS NEEDED FOR MILD/MODERATE CONSTIPATION *DONT TAKE IF LOOSE STOOLS* 30 capsule 2  . folic acid (FOLVITE) 1 MG tablet TAKE 1 TABLET BY MOUTH EVERY DAY 30 tablet 3  . glucosamine-chondroitin 500-400 MG tablet Take 1 tablet by mouth 2 (two) times daily.    . hydrALAZINE (APRESOLINE) 25 MG tablet Take 1 tablet (25 mg total) by mouth 3 (three) times daily. (Patient taking differently: Take 25 mg by mouth 3 (three) times daily as needed. ) 90 tablet 6  . lidocaine-prilocaine (EMLA) cream Apply to affected area once 30 g 3  . Magnesium 500 MG CAPS Take 1 capsule by mouth daily.    . ondansetron (ZOFRAN) 8 MG tablet TAKE 1 TABLET BY MOUTH TWICE A DAY STARTING DAY AFTER CHEMO FOR 3 DAYS THEN AS NEEDED 21 tablet 3  . PARoxetine (PAXIL) 10 MG tablet TAKE 1 TABLET BY MOUTH  EVERY DAY 30 tablet 1  . prochlorperazine (COMPAZINE) 10 MG tablet Take 1 tablet (10 mg total) by mouth every 6 (six) hours as needed (Nausea or vomiting). [place]  tablet 1  . Red Yeast Rice Extract (RED YEAST RICE PO) Take 1,200 mg by mouth 2 (two) times daily.    . traMADol (ULTRAM) 50 MG tablet Take 1 tablet (50 mg total) by mouth daily as needed. For back pain/joint pain (Patient taking differently: Take 50 mg by mouth daily as needed for moderate pain. For back pain/joint pain) 90 tablet 0  . zinc gluconate 50 MG tablet Take 50 mg by mouth daily.     No current facility-administered medications for this visit.    Facility-Administered Medications Ordered in Other Visits  Medication Dose Route Frequency Provider Last Rate Last Dose  . heparin lock flush 100 unit/mL  500 Units Intravenous Once Earlie Server, MD         PHYSICAL EXAMINATION: ECOG PERFORMANCE STATUS: 1 - Symptomatic but completely ambulatory Vitals:   03/27/18 1101  BP: (!) 159/75  Pulse: 92  Resp: 16  Temp: (!) 95.3 F (35.2 C)   Filed Weights   03/27/18 1101  Weight: 119 lb (54 kg)    Physical Exam Constitutional:      Appearance: Normal appearance. She is normal weight.  HENT:     Head: Normocephalic and atraumatic.  Eyes:     Pupils: Pupils are equal, round, and reactive to light.  Neck:     Musculoskeletal: Normal range of motion.  Cardiovascular:     Rate and Rhythm: Normal rate and regular rhythm.     Heart sounds: Normal heart sounds. No murmur.  Pulmonary:     Effort: Pulmonary effort is normal.     Breath sounds: Normal breath sounds. No wheezing.  Abdominal:     General: Bowel sounds are normal. There is no distension.     Palpations: Abdomen is soft.     Tenderness: There is no abdominal tenderness.  Musculoskeletal: Normal range of motion.  Skin:    General: Skin is warm and dry.     Findings: No rash.  Neurological:     Mental Status: She is alert and oriented to person, place, and time.    Psychiatric:        Judgment: Judgment normal.      LABORATORY DATA:  I have reviewed the data as listed Lab Results  Component Value Date   WBC 7.2 03/27/2018   HGB 10.2 (L) 03/27/2018   HCT 32.9 (L) 03/27/2018   MCV 81.8 03/27/2018   PLT 395 03/27/2018   Recent Labs    02/13/18 1307 03/06/18 1316 03/27/18 1030  NA 135 135 139  K 3.7 3.8 3.7  CL 99 99 101  CO2 27 27 27   GLUCOSE 135* 137* 101*  BUN 15 16 13   CREATININE 0.86 0.71 0.57  CALCIUM 8.9 9.3 9.1  GFRNONAA >60 >60 >60  GFRAA >60 >60 >60  PROT 6.4* 7.0 7.1  ALBUMIN 3.2* 3.3* 3.4*  AST 25 27 28   ALT 12 15 16   ALKPHOS 76 101 104  BILITOT 0.2* 0.3 0.4   RADIOGRAPHIC STUDIES: Most recent imaging is November 2019 revealing no substantial interval change.  Therapy was continued.  ASSESSMENT & PLAN:  84 year old female who has a history of left lower lobe primary lung adenocarcinoma present for evaluation of newly diagnosed 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 Continues to tolerate maintenance Alimta and Keytruda well. Labs adequate for treatment today. Proceed with cycle 9 of Alimta and Keytruda today. Continue folic acid a good morning s prescribed. Return to clinic in 3 weeks with labs, assessment and consideration of cycle 10.  She will receive her monthly vitamin B12 injection.  #Normocytic anemia, due to chemotherapy.  Hemoglobin remained stable; slightly increased  # Anexiety, well controlled. Continue Paxil 10mg  daily.  # Fatigue stable.  #Constipation: Continue Metamucil and stool softener as needed.  All questions were answered in great detail  today.  Greater than 50% was spent in counseling and coordination of care with this patient including but not limited to discussion of the relevant topics above (See A&P) including, but not limited to diagnosis and management of acute and chronic medical conditions.   Faythe Casa, NP 03/28/2018 9:38 AM

## 2018-04-10 ENCOUNTER — Encounter: Payer: Self-pay | Admitting: Internal Medicine

## 2018-04-17 ENCOUNTER — Encounter: Payer: Self-pay | Admitting: Oncology

## 2018-04-17 ENCOUNTER — Other Ambulatory Visit: Payer: Self-pay

## 2018-04-17 ENCOUNTER — Inpatient Hospital Stay (HOSPITAL_BASED_OUTPATIENT_CLINIC_OR_DEPARTMENT_OTHER): Payer: Medicare HMO | Admitting: Oncology

## 2018-04-17 ENCOUNTER — Inpatient Hospital Stay: Payer: Medicare HMO

## 2018-04-17 ENCOUNTER — Inpatient Hospital Stay: Payer: Medicare HMO | Attending: Oncology

## 2018-04-17 VITALS — BP 178/93 | HR 96 | Temp 96.6°F | Resp 18 | Wt 119.1 lb

## 2018-04-17 DIAGNOSIS — M545 Low back pain: Secondary | ICD-10-CM

## 2018-04-17 DIAGNOSIS — I701 Atherosclerosis of renal artery: Secondary | ICD-10-CM

## 2018-04-17 DIAGNOSIS — C3492 Malignant neoplasm of unspecified part of left bronchus or lung: Secondary | ICD-10-CM | POA: Diagnosis not present

## 2018-04-17 DIAGNOSIS — R0789 Other chest pain: Secondary | ICD-10-CM | POA: Diagnosis not present

## 2018-04-17 DIAGNOSIS — Z79899 Other long term (current) drug therapy: Secondary | ICD-10-CM

## 2018-04-17 DIAGNOSIS — E785 Hyperlipidemia, unspecified: Secondary | ICD-10-CM | POA: Insufficient documentation

## 2018-04-17 DIAGNOSIS — Z5111 Encounter for antineoplastic chemotherapy: Secondary | ICD-10-CM

## 2018-04-17 DIAGNOSIS — C782 Secondary malignant neoplasm of pleura: Secondary | ICD-10-CM | POA: Diagnosis not present

## 2018-04-17 DIAGNOSIS — M129 Arthropathy, unspecified: Secondary | ICD-10-CM | POA: Diagnosis not present

## 2018-04-17 DIAGNOSIS — I10 Essential (primary) hypertension: Secondary | ICD-10-CM | POA: Insufficient documentation

## 2018-04-17 DIAGNOSIS — R530 Neoplastic (malignant) related fatigue: Secondary | ICD-10-CM | POA: Diagnosis not present

## 2018-04-17 DIAGNOSIS — C3490 Malignant neoplasm of unspecified part of unspecified bronchus or lung: Secondary | ICD-10-CM

## 2018-04-17 DIAGNOSIS — Z87442 Personal history of urinary calculi: Secondary | ICD-10-CM | POA: Insufficient documentation

## 2018-04-17 DIAGNOSIS — D649 Anemia, unspecified: Secondary | ICD-10-CM | POA: Insufficient documentation

## 2018-04-17 DIAGNOSIS — Z7982 Long term (current) use of aspirin: Secondary | ICD-10-CM

## 2018-04-17 DIAGNOSIS — I2510 Atherosclerotic heart disease of native coronary artery without angina pectoris: Secondary | ICD-10-CM | POA: Diagnosis not present

## 2018-04-17 DIAGNOSIS — G4733 Obstructive sleep apnea (adult) (pediatric): Secondary | ICD-10-CM | POA: Insufficient documentation

## 2018-04-17 DIAGNOSIS — R51 Headache: Secondary | ICD-10-CM | POA: Insufficient documentation

## 2018-04-17 DIAGNOSIS — D630 Anemia in neoplastic disease: Secondary | ICD-10-CM

## 2018-04-17 DIAGNOSIS — Z5112 Encounter for antineoplastic immunotherapy: Secondary | ICD-10-CM

## 2018-04-17 DIAGNOSIS — F419 Anxiety disorder, unspecified: Secondary | ICD-10-CM

## 2018-04-17 DIAGNOSIS — E871 Hypo-osmolality and hyponatremia: Secondary | ICD-10-CM | POA: Diagnosis not present

## 2018-04-17 DIAGNOSIS — R69 Illness, unspecified: Secondary | ICD-10-CM | POA: Diagnosis not present

## 2018-04-17 LAB — COMPREHENSIVE METABOLIC PANEL
ALT: 14 U/L (ref 0–44)
ANION GAP: 10 (ref 5–15)
AST: 28 U/L (ref 15–41)
Albumin: 3.6 g/dL (ref 3.5–5.0)
Alkaline Phosphatase: 127 U/L — ABNORMAL HIGH (ref 38–126)
BUN: 14 mg/dL (ref 8–23)
CO2: 28 mmol/L (ref 22–32)
Calcium: 9.3 mg/dL (ref 8.9–10.3)
Chloride: 102 mmol/L (ref 98–111)
Creatinine, Ser: 0.67 mg/dL (ref 0.44–1.00)
GFR calc Af Amer: >60 mL/min (ref >60)
GFR calc non Af Amer: >60 mL/min (ref >60)
Glucose, Bld: 108 mg/dL — ABNORMAL HIGH (ref 70–99)
Potassium: 3.8 mmol/L (ref 3.5–5.1)
Sodium: 140 mmol/L (ref 135–145)
Total Bilirubin: 0.6 mg/dL (ref 0.3–1.2)
Total Protein: 7.1 g/dL (ref 6.5–8.1)

## 2018-04-17 LAB — CBC WITH DIFFERENTIAL/PLATELET
ABS IMMATURE GRANULOCYTES: 0.03 10*3/uL (ref 0.00–0.07)
Basophils Absolute: 0.1 10*3/uL (ref 0.0–0.1)
Basophils Relative: 1 %
Eosinophils Absolute: 0.1 10*3/uL (ref 0.0–0.5)
Eosinophils Relative: 1 %
HCT: 34.1 % — ABNORMAL LOW (ref 36.0–46.0)
HEMOGLOBIN: 10.5 g/dL — AB (ref 12.0–15.0)
Immature Granulocytes: 0 %
Lymphocytes Relative: 18 %
Lymphs Abs: 1.2 10*3/uL (ref 0.7–4.0)
MCH: 25.1 pg — ABNORMAL LOW (ref 26.0–34.0)
MCHC: 30.8 g/dL (ref 30.0–36.0)
MCV: 81.6 fL (ref 80.0–100.0)
Monocytes Absolute: 0.8 10*3/uL (ref 0.1–1.0)
Monocytes Relative: 11 %
NEUTROS ABS: 4.7 10*3/uL (ref 1.7–7.7)
NEUTROS PCT: 69 %
Platelets: 413 10*3/uL — ABNORMAL HIGH (ref 150–400)
RBC: 4.18 MIL/uL (ref 3.87–5.11)
RDW: 17.6 % — ABNORMAL HIGH (ref 11.5–15.5)
WBC: 6.9 10*3/uL (ref 4.0–10.5)
nRBC: 0 % (ref 0.0–0.2)

## 2018-04-17 MED ORDER — SODIUM CHLORIDE 0.9 % IV SOLN
700.0000 mg | Freq: Once | INTRAVENOUS | Status: AC
  Administered 2018-04-17: 700 mg via INTRAVENOUS
  Filled 2018-04-17: qty 20

## 2018-04-17 MED ORDER — HEPARIN SOD (PORK) LOCK FLUSH 100 UNIT/ML IV SOLN
INTRAVENOUS | Status: AC
  Filled 2018-04-17: qty 5

## 2018-04-17 MED ORDER — ONDANSETRON HCL 4 MG/2ML IJ SOLN
8.0000 mg | Freq: Once | INTRAMUSCULAR | Status: AC
  Administered 2018-04-17: 8 mg via INTRAVENOUS
  Filled 2018-04-17: qty 4

## 2018-04-17 MED ORDER — DEXAMETHASONE SODIUM PHOSPHATE 10 MG/ML IJ SOLN
10.0000 mg | Freq: Once | INTRAMUSCULAR | Status: AC
  Administered 2018-04-17: 10 mg via INTRAVENOUS
  Filled 2018-04-17: qty 1

## 2018-04-17 MED ORDER — SODIUM CHLORIDE 0.9 % IV SOLN
200.0000 mg | Freq: Once | INTRAVENOUS | Status: AC
  Administered 2018-04-17: 200 mg via INTRAVENOUS
  Filled 2018-04-17: qty 8

## 2018-04-17 MED ORDER — SODIUM CHLORIDE 0.9 % IV SOLN
Freq: Once | INTRAVENOUS | Status: AC
  Administered 2018-04-17: 12:00:00 via INTRAVENOUS
  Filled 2018-04-17: qty 250

## 2018-04-17 MED ORDER — HEPARIN SOD (PORK) LOCK FLUSH 100 UNIT/ML IV SOLN
500.0000 [IU] | Freq: Once | INTRAVENOUS | Status: AC
  Administered 2018-04-17: 500 [IU] via INTRAVENOUS

## 2018-04-17 NOTE — Progress Notes (Signed)
Patient here for follow up. Pt complains of tenderness to left ribcage.

## 2018-04-17 NOTE — Progress Notes (Signed)
Hematology/Oncology follow up note [facility] [phone] [phone]   Patient Care Team: McLean-Scocuzza, Nino Glow, MD as PCP - General (Internal Medicine) Wellington Hampshire, MD as Consulting Physician (Cardiology) Telford Nab, RN as Registered Nurse  REFERRING PROVIDER: [place] VISIT  follow-up for assessment after chemotherapy treatment for stage IV lung adenocarcinoma. HISTORY OF PRESENTING ILLNESS:  Tamara Burton is a  85 y.o.  female with PMH listed below who was referred to me for evaluation of lung cancer.  Patient reports a remote history of lung cancer in 2003 and status post left lower lobectomy.  We obtained medical  records from [facility] in [place].  Reviewed pathology results.  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  Perihilar lymph node negative.  Peri-bronchial lymph node negative. Patient is accompanied by her friend who is her house health power of attorney Patient has noticed mild chronic left side chest wall pain which she described as a stitch, for about 6 months to a year.  Pain is very mild and she takes Tylenol or tramadol as needed.  Pain got little worse early this year. 07/15/2017 CT chest with contrast showed new left pleural process since prior CT from 2016.  Findings most suspicious for pleural tumor with complex pleural fluid.  Medial left upper lobe atelectasis without obvious tumor. 08/16/2017 PET scan showed multifocal left side pleural hypermetabolic is him and a soft tissue thickening, consistent with pleural metastasis Hyper metabolic soft tissue density within the lingula, suspicious for primary bronchogenic carcinoma.   There is also left adrenal hypermetabolic them is nonspecific and without CT correlate.  Otherwise no evidence of extra thoracic metastatic  disease.  Right nephrolithiasis.  # 08/26/2017 CT biopsy of left lower chest pleura showed positive for adenocarcinoma.   Patient's case was discussed on tumor board on Sep 01, 2017.  Per pathology, there is no enough tissue for foundation one testing or Omniseq.  Patient was referred by pulmonology Dr. Shawna Orleans to me to discuss about management plan.  INTERVAL HISTORY Tamara Burton is a 85 y.o. female who has above history reviewed by me today presents for assessment prior to chemotherapy with maintenance Alimta and immunotherapy.  #Patient has tolerated chemotherapy and immunotherapy well.  No clinical signs of immunotherapy related toxicity. Denies any shortness of breath, diarrhea.   #She reports chronic fatigue which has not changed from her baseline. Appetite is fair. Continues to be active.  Exercise daily.  She has chronic left lower chest wall intermittent achiness, sometimes triggered with certain movements. Weight has remained stable.  #Oncology treatment Status post 4 cycles of carbo/Alimta/Keytruda [based on Keynote 189 study, triplet improved ORR, PFS, and OS]. Starting cycle 5, patient was started on Alimta and Keytruda Q21days maintenance.  Review of Systems  Constitutional: Positive for malaise/fatigue. Negative for chills, fever and weight loss.  HENT: Negative for sore throat.   Eyes: Negative for redness.  Respiratory: Negative for cough, shortness of breath and wheezing.   Cardiovascular: Negative for chest pain, palpitations and leg swelling.       Intermittent left lower chest wall aches  Gastrointestinal: Negative for abdominal pain, blood in stool, nausea and vomiting.  Genitourinary: Negative for dysuria.  Musculoskeletal: Negative for myalgias.  Skin: Negative for rash.  Neurological: Negative for dizziness, tingling and tremors.  Endo/Heme/Allergies: Does not bruise/bleed easily.  Psychiatric/Behavioral: Negative for hallucinations.  MEDICAL  HISTORY:  Past Medical History:  Diagnosis Date  . Arthritis    "qwhere"  . Carotid artery disease ([place])   . Chronic lower back pain   . Colon polyps    benign per pt   . Epistaxis    with use of nasal sprays   . Headache    "lately; related to HTN" (04/24/2014)  . Hyperlipidemia   . Hypertension   . Hyponatremia    h/o  . Kidney stone   . Lung cancer ([place]) 2003   s/p left lower lobe resection  . Non-obstructive CAD    a. 04/2014 Cath: LM nl, LAD/D1/LCX/RCA min irregs, RPDA/PAV nl.  . Orthostatic hypotension   . OSA on CPAP    "wear it intermittently"  . PONV (postoperative nausea and vomiting)    "when I was younger"  . Renal artery stenosis ([place])    a. 04/2014 Renal angiography: LRA 95p, RRA 40ost. PTA of LRA deferred 2/2 guide cath induced Ao dissection;  01/27 6 x 15 mm Herculink stent to the Left-RA  follows with [facility] Dr. Ronalee Belts  . Sinus headache     SURGICAL HISTORY: Past Surgical History:  Procedure Laterality Date  . APPENDECTOMY    . BILATERAL OOPHORECTOMY Bilateral 2000's  . CARDIAC CATHETERIZATION  04/24/2014  . CATARACT EXTRACTION W/ INTRAOCULAR LENS  IMPLANT, BILATERAL Bilateral   . JOINT REPLACEMENT    . LEFT HEART CATHETERIZATION WITH CORONARY ANGIOGRAM N/A 04/24/2014   Procedure: LEFT HEART CATHETERIZATION WITH CORONARY ANGIOGRAM;  Surgeon: Wellington Hampshire, MD;  Location: [facility];  Service: Cardiovascular;  Laterality: N/A;  . LUNG LOBECTOMY Left 2003   "lower lobe"  . PERIPHERAL VASCULAR CATHETERIZATION  05/01/2014   Procedure: RENAL INTERVENTION;  Surgeon: Wellington Hampshire, MD; 6 x 15 mm Herculink stent to the Left Renal Artery  . PORTA CATH INSERTION N/A 09/26/2017   Procedure: PORTA CATH INSERTION;  Surgeon: Algernon Huxley, MD;  Location: [facility];  Service: Cardiovascular;  Laterality: N/A;  . RENAL ANGIOGRAM  04/24/2014  . RENAL ANGIOGRAM N/A 04/24/2014   Procedure: RENAL ANGIOGRAM;  Surgeon: Wellington Hampshire, MD;  Location: [facility];   Service: Cardiovascular;  Laterality: N/A;  . RENAL ANGIOGRAM Left 05/01/2014   Procedure: RENAL ANGIOGRAM;  Surgeon: Wellington Hampshire, MD;  Location: [facility];  Service: Cardiovascular;  Laterality: Left;  . REPLACEMENT TOTAL KNEE Left   . TOTAL HIP ARTHROPLASTY Right   . TUBAL LIGATION  ~ 1975  . VAGINAL DELIVERY  X 2  . VAGINAL HYSTERECTOMY  1980's    SOCIAL HISTORY: Social History   Socioeconomic History  . Marital status: Widowed    Spouse name: Not on file  . Number of children: Not on file  . Years of education: Not on file  . Highest education level: Not on file  Occupational History  . Not on file  Social Needs  . Financial resource strain: Not hard at all  . Food insecurity:    Worry: Never true    Inability: Never true  . Transportation needs:    Medical: No    Non-medical: No  Tobacco Use  . Smoking status: Never Smoker  . Smokeless tobacco: Never Used  . Tobacco comment: Father and husband both smokers  Substance and Sexual Activity  . Alcohol use: Yes    Comment: 04/24/2014 "glass of beer or wine q couple weeks"  . Drug use: No  . Sexual activity: Not on  file  Lifestyle  . Physical activity:    Days per week: Not on file    Minutes per session: Not on file  . Stress: Not on file  Relationships  . Social connections:    Talks on phone: Not on file    Gets together: Not on file    Attends religious service: Not on file    Active member of club or organization: Not on file    Attends meetings of clubs or organizations: Not on file    Relationship status: Not on file  . Intimate partner violence:    Fear of current or ex partner: Not on file    Emotionally abused: Not on file    Physically abused: Not on file    Forced sexual activity: Not on file  Other Topics Concern  . Not on file  Social History Narrative   Lives in [place]. Daughter nearby. Used to live in [place]   Normal ADLs as of 02/2017 and no h/o falls    Likes to walk    Lives alone     FAMILY HISTORY: Family History  Problem Relation Age of Onset  . Parkinsonism Mother   . Alzheimer's disease Mother   . Sarcoidosis Brother   . Stroke Maternal Grandmother   . Stomach cancer Paternal Grandmother   . Diabetes Son   . Diabetes Daughter     ALLERGIES:  is allergic to atorvastatin; penicillins; sulfa antibiotics; and amlodipine.  MEDICATIONS:  Current Outpatient Medications  Medication Sig Dispense Refill  . ALPRAZolam (XANAX) 0.25 MG tablet Take 0.25 mg by mouth daily as needed for anxiety. Take 1/2 tablet qd prn    . Ascorbic Acid (VITAMIN C CR) 1500 MG TBCR Take 1,500 mg by mouth daily.     Marland Kitchen aspirin 81 MG tablet Take 81 mg by mouth daily.    . Biotin 2500 MCG CAPS Take 1 capsule by mouth daily.    . Cholecalciferol (VITAMIN D3) 5000 units CAPS Take 1 capsule by mouth daily.    . Coenzyme Q10 (CO Q-10) 200 MG CAPS Take 1 capsule by mouth 2 (two) times daily.    Marland Kitchen docusate sodium (COLACE) 100 MG capsule TAKE 1CAP BY MOUTH DAILY AS NEEDED FOR MILD/MODERATE CONSTIPATION *DONT TAKE IF LOOSE STOOLS* 30 capsule 2  . folic acid (FOLVITE) 1 MG tablet TAKE 1 TABLET BY MOUTH EVERY DAY 30 tablet 3  . glucosamine-chondroitin 500-400 MG tablet Take 1 tablet by mouth 2 (two) times daily.    . hydrALAZINE (APRESOLINE) 25 MG tablet Take 1 tablet (25 mg total) by mouth 3 (three) times daily. (Patient taking differently: Take 25 mg by mouth 3 (three) times daily as needed. ) 90 tablet 6  . lidocaine-prilocaine (EMLA) cream Apply to affected area once 30 g 3  . Magnesium 500 MG CAPS Take 1 capsule by mouth daily.    . ondansetron (ZOFRAN) 8 MG tablet TAKE 1 TABLET BY MOUTH TWICE A DAY STARTING DAY AFTER CHEMO FOR 3 DAYS THEN AS NEEDED 21 tablet 3  . PARoxetine (PAXIL) 10 MG tablet TAKE 1 TABLET BY MOUTH EVERY DAY 30 tablet 1  . prochlorperazine (COMPAZINE) 10 MG tablet Take 1 tablet (10 mg total) by mouth every 6 (six) hours as needed (Nausea or vomiting). 30 tablet 1    . Red Yeast Rice Extract (RED YEAST RICE PO) Take 1,200 mg by mouth 2 (two) times daily.    . traMADol (ULTRAM) 50 MG tablet Take 1  tablet (50 mg total) by mouth daily as needed. For back pain/joint pain (Patient taking differently: Take 50 mg by mouth daily as needed for moderate pain. For back pain/joint pain) 90 tablet 0  . zinc gluconate 50 MG tablet Take 50 mg by mouth daily.     No current facility-administered medications for this visit.    Facility-Administered Medications Ordered in Other Visits  Medication Dose Route Frequency Provider Last Rate Last Dose  . PEMEtrexed (ALIMTA) 700 mg in sodium chloride 0.9 % 100 mL chemo infusion  700 mg Intravenous Once Earlie Server, MD         PHYSICAL EXAMINATION: ECOG PERFORMANCE STATUS: 1 - Symptomatic but completely ambulatory Vitals:   04/17/18 1038  BP: (!) 178/93  Pulse: 96  Resp: 18  Temp: (!) 96.6 F (35.9 C)  SpO2: 98%   Filed Weights   04/17/18 1038  Weight: 119 lb 1.6 oz (54 kg)    Physical Exam Constitutional:      General: She is not in acute distress. HENT:     Head: Normocephalic and atraumatic.  Eyes:     General: No scleral icterus.    Conjunctiva/sclera: Conjunctivae normal.     Pupils: Pupils are equal, round, and reactive to light.  Neck:     Musculoskeletal: Normal range of motion and neck supple.  Cardiovascular:     Rate and Rhythm: Normal rate and regular rhythm.     Heart sounds: Normal heart sounds.  Pulmonary:     Effort: Pulmonary effort is normal. No respiratory distress.     Breath sounds: No wheezing or rales.     Comments: Slightly decreased breath sound at left lower base. Chest:     Chest wall: No tenderness.  Abdominal:     General: Bowel sounds are normal. There is no distension.     Palpations: Abdomen is soft. There is no mass.     Tenderness: There is no abdominal tenderness.  Musculoskeletal: Normal range of motion.        General: No deformity.  Lymphadenopathy:     Cervical:  No cervical adenopathy.  Skin:    General: Skin is warm and dry.     Findings: No erythema or rash.  Neurological:     Mental Status: She is alert and oriented to person, place, and time.     Cranial Nerves: No cranial nerve deficit.     Coordination: Coordination normal.  Psychiatric:        Behavior: Behavior normal.        Thought Content: Thought content normal.      LABORATORY DATA:  I have reviewed the data as listed Lab Results  Component Value Date   WBC 6.9 04/17/2018   HGB 10.5 (L) 04/17/2018   HCT 34.1 (L) 04/17/2018   MCV 81.6 04/17/2018   PLT 413 (H) 04/17/2018   Recent Labs    03/06/18 1316 03/27/18 1030 04/17/18 1012  NA 135 139 140  K 3.8 3.7 3.8  CL 99 101 102  CO2 27 27 28   GLUCOSE 137* 101* 108*  BUN 16 13 14   CREATININE 0.71 0.57 0.67  CALCIUM 9.3 9.1 9.3  GFRNONAA >60 >60 >60  GFRAA >60 >60 >60  PROT 7.0 7.1 7.1  ALBUMIN 3.3* 3.4* 3.6  AST 27 28 28   ALT 15 16 14   ALKPHOS 101 104 127*  BILITOT 0.3 0.4 0.6   RADIOGRAPHIC STUDIES: I have personally reviewed the radiological images as listed and agreed with  the findings in the report. 12/06/2017 PET scan was independently reviewed by me and discussed with patient and her friends.  PET scan showed notable reduced activity in the pleural metastatic disease on the left.  Reduce lingular mass activity.  There was a loculated left pleural effusion has moderately enlarged.  Currently no definite adrenal hypermetabolic activity.  Chronic findings include aortic atherosclerosis, coronary atherosclerosis, nonobstructing right nephrolithiasis.  Dense mitral calcification    ASSESSMENT & PLAN:  85 year old female who has a history of left lower lobe primary lung adenocarcinoma present for evaluation of diagnosed 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 1. 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 Malignant neoplasm of left lung, unspecified part of lung ([place])   3. Encounter for antineoplastic  chemotherapy   4. Encounter for antineoplastic immunotherapy   5. Anemia in neoplastic disease   6. Neoplastic malignant related fatigue   7. Hypertension, unspecified type   Cancer Staging Malignant neoplasm of unspecified part of unspecified bronchus or lung ([place]) Staging form: Lung, AJCC 8th Edition - Clinical stage from 09/02/2017: Stage IV (cT2a, cN0, pM1a) - Signed by Earlie Server, MD on 09/02/2017  #Stage IV left lower lobe primary lung adenocarcinoma with pleural involvement. Stable disease.  Tolerates maintenance Alimta and Keytruda well. Labs are reviewed and discussed with patient. Counts acceptable to proceed with today's Alimta and Keytruda treatment. Continue folic acid daily. She has received 1 dose of B12 injection in December 2019.  She will be due for the next B12 injection in 6 weeks.  #Normocytic anemia, hemoglobin improved to 10.5.  Continue monitoring. #Anxiety, well controlled.  Continue Paxil 10 mg daily. #Left lower chest wall intermittent achiness secondary to cancer. Asked patient to use Tylenol as instructed for pain. #Chronic fatigue, stable.  TSH stable.  We spent sufficient time to discuss many aspect of care, questions were answered to patient's satisfaction.  Return of visit:  3 weeks for maintenance chemotherapy and immunotherapy.  Earlie Server, MD, PhD Hematology Oncology [facility] Pager- [phone] 04/17/2018

## 2018-04-19 DIAGNOSIS — H524 Presbyopia: Secondary | ICD-10-CM | POA: Diagnosis not present

## 2018-04-19 DIAGNOSIS — Z961 Presence of intraocular lens: Secondary | ICD-10-CM | POA: Diagnosis not present

## 2018-04-25 ENCOUNTER — Other Ambulatory Visit: Payer: Self-pay | Admitting: Oncology

## 2018-05-03 ENCOUNTER — Telehealth: Payer: Self-pay | Admitting: *Deleted

## 2018-05-03 NOTE — Telephone Encounter (Signed)
Pt called stating that she is unable to afford out of pocket cost for ondansetron since insurance is not covering prescription. Per Dr. Tasia Catchings, pt can use compazine only and stop the ondansetron at this time. Pt made aware and instructed to call back if nausea is not controlled with compazine alone. Pt verbalized understanding.

## 2018-05-08 ENCOUNTER — Other Ambulatory Visit: Payer: Self-pay

## 2018-05-08 ENCOUNTER — Inpatient Hospital Stay: Payer: Medicare HMO

## 2018-05-08 ENCOUNTER — Inpatient Hospital Stay: Payer: Medicare HMO | Attending: Oncology

## 2018-05-08 ENCOUNTER — Encounter: Payer: Self-pay | Admitting: Oncology

## 2018-05-08 ENCOUNTER — Inpatient Hospital Stay (HOSPITAL_BASED_OUTPATIENT_CLINIC_OR_DEPARTMENT_OTHER): Payer: Medicare HMO | Admitting: Oncology

## 2018-05-08 VITALS — BP 171/89 | HR 85 | Temp 96.5°F | Resp 18 | Wt 117.5 lb

## 2018-05-08 DIAGNOSIS — D630 Anemia in neoplastic disease: Secondary | ICD-10-CM | POA: Diagnosis not present

## 2018-05-08 DIAGNOSIS — Z7982 Long term (current) use of aspirin: Secondary | ICD-10-CM | POA: Insufficient documentation

## 2018-05-08 DIAGNOSIS — M129 Arthropathy, unspecified: Secondary | ICD-10-CM

## 2018-05-08 DIAGNOSIS — Z79899 Other long term (current) drug therapy: Secondary | ICD-10-CM | POA: Insufficient documentation

## 2018-05-08 DIAGNOSIS — C3432 Malignant neoplasm of lower lobe, left bronchus or lung: Secondary | ICD-10-CM | POA: Diagnosis not present

## 2018-05-08 DIAGNOSIS — G4730 Sleep apnea, unspecified: Secondary | ICD-10-CM

## 2018-05-08 DIAGNOSIS — C3492 Malignant neoplasm of unspecified part of left bronchus or lung: Secondary | ICD-10-CM

## 2018-05-08 DIAGNOSIS — M549 Dorsalgia, unspecified: Secondary | ICD-10-CM

## 2018-05-08 DIAGNOSIS — E871 Hypo-osmolality and hyponatremia: Secondary | ICD-10-CM | POA: Insufficient documentation

## 2018-05-08 DIAGNOSIS — Z87442 Personal history of urinary calculi: Secondary | ICD-10-CM | POA: Diagnosis not present

## 2018-05-08 DIAGNOSIS — Z800 Family history of malignant neoplasm of digestive organs: Secondary | ICD-10-CM | POA: Diagnosis not present

## 2018-05-08 DIAGNOSIS — I10 Essential (primary) hypertension: Secondary | ICD-10-CM

## 2018-05-08 DIAGNOSIS — Z5111 Encounter for antineoplastic chemotherapy: Secondary | ICD-10-CM | POA: Diagnosis not present

## 2018-05-08 DIAGNOSIS — R69 Illness, unspecified: Secondary | ICD-10-CM | POA: Diagnosis not present

## 2018-05-08 DIAGNOSIS — Z7189 Other specified counseling: Secondary | ICD-10-CM

## 2018-05-08 DIAGNOSIS — R5382 Chronic fatigue, unspecified: Secondary | ICD-10-CM

## 2018-05-08 DIAGNOSIS — I701 Atherosclerosis of renal artery: Secondary | ICD-10-CM

## 2018-05-08 DIAGNOSIS — E785 Hyperlipidemia, unspecified: Secondary | ICD-10-CM

## 2018-05-08 DIAGNOSIS — Z86010 Personal history of colonic polyps: Secondary | ICD-10-CM

## 2018-05-08 DIAGNOSIS — Z5112 Encounter for antineoplastic immunotherapy: Secondary | ICD-10-CM | POA: Insufficient documentation

## 2018-05-08 DIAGNOSIS — C3490 Malignant neoplasm of unspecified part of unspecified bronchus or lung: Secondary | ICD-10-CM

## 2018-05-08 DIAGNOSIS — F419 Anxiety disorder, unspecified: Secondary | ICD-10-CM | POA: Diagnosis not present

## 2018-05-08 DIAGNOSIS — R51 Headache: Secondary | ICD-10-CM

## 2018-05-08 DIAGNOSIS — M25551 Pain in right hip: Secondary | ICD-10-CM | POA: Insufficient documentation

## 2018-05-08 DIAGNOSIS — C782 Secondary malignant neoplasm of pleura: Secondary | ICD-10-CM

## 2018-05-08 LAB — CBC WITH DIFFERENTIAL/PLATELET
Abs Immature Granulocytes: 0.03 10*3/uL (ref 0.00–0.07)
BASOS ABS: 0.1 10*3/uL (ref 0.0–0.1)
Basophils Relative: 1 %
Eosinophils Absolute: 0.1 10*3/uL (ref 0.0–0.5)
Eosinophils Relative: 1 %
HCT: 32.5 % — ABNORMAL LOW (ref 36.0–46.0)
Hemoglobin: 10 g/dL — ABNORMAL LOW (ref 12.0–15.0)
Immature Granulocytes: 0 %
Lymphocytes Relative: 12 %
Lymphs Abs: 1 10*3/uL (ref 0.7–4.0)
MCH: 24.6 pg — AB (ref 26.0–34.0)
MCHC: 30.8 g/dL (ref 30.0–36.0)
MCV: 80 fL (ref 80.0–100.0)
MONO ABS: 0.8 10*3/uL (ref 0.1–1.0)
Monocytes Relative: 10 %
Neutro Abs: 6.2 10*3/uL (ref 1.7–7.7)
Neutrophils Relative %: 76 %
Platelets: 402 10*3/uL — ABNORMAL HIGH (ref 150–400)
RBC: 4.06 MIL/uL (ref 3.87–5.11)
RDW: 18 % — ABNORMAL HIGH (ref 11.5–15.5)
WBC: 8.2 10*3/uL (ref 4.0–10.5)
nRBC: 0 % (ref 0.0–0.2)

## 2018-05-08 LAB — COMPREHENSIVE METABOLIC PANEL
ALT: 11 U/L (ref 0–44)
AST: 23 U/L (ref 15–41)
Albumin: 3.4 g/dL — ABNORMAL LOW (ref 3.5–5.0)
Alkaline Phosphatase: 118 U/L (ref 38–126)
Anion gap: 5 (ref 5–15)
BUN: 14 mg/dL (ref 8–23)
CHLORIDE: 101 mmol/L (ref 98–111)
CO2: 28 mmol/L (ref 22–32)
Calcium: 8.8 mg/dL — ABNORMAL LOW (ref 8.9–10.3)
Creatinine, Ser: 0.84 mg/dL (ref 0.44–1.00)
GFR calc Af Amer: >60 mL/min (ref >60)
GFR calc non Af Amer: >60 mL/min (ref >60)
Glucose, Bld: 109 mg/dL — ABNORMAL HIGH (ref 70–99)
Potassium: 4 mmol/L (ref 3.5–5.1)
Sodium: 134 mmol/L — ABNORMAL LOW (ref 135–145)
Total Bilirubin: 0.3 mg/dL (ref 0.3–1.2)
Total Protein: 7 g/dL (ref 6.5–8.1)

## 2018-05-08 MED ORDER — SODIUM CHLORIDE 0.9 % IV SOLN
Freq: Once | INTRAVENOUS | Status: AC
  Administered 2018-05-08: 11:00:00 via INTRAVENOUS
  Filled 2018-05-08: qty 250

## 2018-05-08 MED ORDER — SODIUM CHLORIDE 0.9 % IV SOLN
200.0000 mg | Freq: Once | INTRAVENOUS | Status: AC
  Administered 2018-05-08: 200 mg via INTRAVENOUS
  Filled 2018-05-08: qty 8

## 2018-05-08 MED ORDER — CYANOCOBALAMIN 1000 MCG/ML IJ SOLN
1000.0000 ug | Freq: Once | INTRAMUSCULAR | Status: AC
  Administered 2018-05-08: 1000 ug via INTRAMUSCULAR
  Filled 2018-05-08: qty 1

## 2018-05-08 MED ORDER — SODIUM CHLORIDE 0.9 % IV SOLN
700.0000 mg | Freq: Once | INTRAVENOUS | Status: AC
  Administered 2018-05-08: 700 mg via INTRAVENOUS
  Filled 2018-05-08: qty 20

## 2018-05-08 MED ORDER — HEPARIN SOD (PORK) LOCK FLUSH 100 UNIT/ML IV SOLN
500.0000 [IU] | Freq: Once | INTRAVENOUS | Status: AC | PRN
  Administered 2018-05-08: 500 [IU]
  Filled 2018-05-08: qty 5

## 2018-05-08 MED ORDER — ONDANSETRON HCL 4 MG/2ML IJ SOLN
8.0000 mg | Freq: Once | INTRAMUSCULAR | Status: AC
  Administered 2018-05-08: 8 mg via INTRAVENOUS
  Filled 2018-05-08: qty 4

## 2018-05-08 MED ORDER — DEXAMETHASONE SODIUM PHOSPHATE 10 MG/ML IJ SOLN
10.0000 mg | Freq: Once | INTRAMUSCULAR | Status: AC
  Administered 2018-05-08: 10 mg via INTRAVENOUS
  Filled 2018-05-08: qty 1

## 2018-05-08 MED ORDER — SODIUM CHLORIDE 0.9% FLUSH
10.0000 mL | INTRAVENOUS | Status: DC | PRN
  Administered 2018-05-08: 10 mL
  Filled 2018-05-08: qty 10

## 2018-05-08 NOTE — Progress Notes (Signed)
Hematology/Oncology follow up note [facility] [phone] [phone]   Patient Care Team: McLean-Scocuzza, Nino Glow, MD as PCP - General (Internal Medicine) Wellington Hampshire, MD as Consulting Physician (Cardiology) Telford Nab, RN as Registered Nurse  REFERRING PROVIDER: Malcolm VISIT  follow-up for assessment after chemotherapy treatment for stage IV lung adenocarcinoma. HISTORY OF PRESENTING ILLNESS:  Tamara Burton is a  85 y.o.  female with PMH listed below who was referred to me for evaluation of lung cancer.  Patient reports a remote history of lung cancer in 2003 and status post left lower lobectomy.  We obtained medical  records from [facility] in [place].  Reviewed pathology results.  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  Perihilar lymph node negative.  Peri-bronchial lymph node negative. Patient is accompanied by her friend who is her house health power of attorney Patient has noticed mild chronic left side chest wall pain which she described as a stitch, for about 6 months to a year.  Pain is very mild and she takes Tylenol or tramadol as needed.  Pain got little worse early this year. 07/15/2017 CT chest with contrast showed new left pleural process since prior CT from 2016.  Findings most suspicious for pleural tumor with complex pleural fluid.  Medial left upper lobe atelectasis without obvious tumor. 08/16/2017 PET scan showed multifocal left side pleural hypermetabolic is him and a soft tissue thickening, consistent with pleural metastasis Hyper metabolic soft tissue density within the lingula, suspicious for primary bronchogenic carcinoma.   There is also left adrenal hypermetabolic them is nonspecific and without CT correlate.  Otherwise no evidence of extra thoracic metastatic  disease.  Right nephrolithiasis.  # 08/26/2017 CT biopsy of left lower chest pleura showed positive for adenocarcinoma.   Patient's case was discussed on tumor board on Sep 01, 2017.  Per pathology, there is no enough tissue for foundation one testing or Omniseq.  Patient was referred by pulmonology Dr. Shawna Orleans to me to discuss about management plan.  INTERVAL HISTORY Tamara Burton is a 85 y.o. female who has above history reviewed by me today presents for assessment prior to chemotherapy with maintenance Alimta and immunotherapy.  Patient reports feeling more tired lately. Blood pressure has been high since last visit.  She is currently not on Coreg and losartan. She also tells me that she does not measure her blood pressure at home because it will make her anxious. Otherwise tolerating chemotherapy and immunotherapy well.  No clinical signs of immunotherapy related toxicity. Denies any shortness of breath or diarrhea. Reports right arthritic hip pain.  Sometimes shooting down to lower extremity. Continues to be active and exercise daily. Appetite is fair.  It comes and goes.    #Oncology treatment Status post 4 cycles of carbo/Alimta/Keytruda [based on Keynote 189 study, triplet improved ORR, PFS, and OS]. Starting cycle 5, patient was started on Alimta and Keytruda Q21days maintenance.  Review of Systems  Constitutional: Positive for malaise/fatigue. Negative for chills, fever and weight loss.  HENT: Negative for sore throat.   Eyes: Negative for redness.  Respiratory: Negative for cough, shortness of breath and wheezing.   Cardiovascular: Negative for chest pain, palpitations and leg swelling.       Intermittent left lower chest wall aches  Gastrointestinal: Negative for abdominal pain, blood in stool, nausea and vomiting.  Genitourinary: Negative for dysuria.  Musculoskeletal:  Positive for joint pain. Negative for myalgias.  Skin: Negative for rash.  Neurological: Negative  for dizziness, tingling and tremors.  Endo/Heme/Allergies: Does not bruise/bleed easily.  Psychiatric/Behavioral: Negative for hallucinations.    MEDICAL HISTORY:  Past Medical History:  Diagnosis Date  . Arthritis    "qwhere"  . Carotid artery disease ([place])   . Chronic lower back pain   . Colon polyps    benign per pt   . Epistaxis    with use of nasal sprays   . Headache    "lately; related to HTN" (04/24/2014)  . Hyperlipidemia   . Hypertension   . Hyponatremia    h/o  . Kidney stone   . Lung cancer ([place]) 2003   s/p left lower lobe resection  . Non-obstructive CAD    a. 04/2014 Cath: LM nl, LAD/D1/LCX/RCA min irregs, RPDA/PAV nl.  . Orthostatic hypotension   . OSA on CPAP    "wear it intermittently"  . PONV (postoperative nausea and vomiting)    "when I was younger"  . Renal artery stenosis ([place])    a. 04/2014 Renal angiography: LRA 95p, RRA 40ost. PTA of LRA deferred 2/2 guide cath induced Ao dissection;  01/27 6 x 15 mm Herculink stent to the Left-RA  follows with [facility] Dr. Ronalee Belts  . Sinus headache     SURGICAL HISTORY: Past Surgical History:  Procedure Laterality Date  . APPENDECTOMY    . BILATERAL OOPHORECTOMY Bilateral 2000's  . CARDIAC CATHETERIZATION  04/24/2014  . CATARACT EXTRACTION W/ INTRAOCULAR LENS  IMPLANT, BILATERAL Bilateral   . JOINT REPLACEMENT    . LEFT HEART CATHETERIZATION WITH CORONARY ANGIOGRAM N/A 04/24/2014   Procedure: LEFT HEART CATHETERIZATION WITH CORONARY ANGIOGRAM;  Surgeon: Wellington Hampshire, MD;  Location: [place] CATH LAB;  Service: Cardiovascular;  Laterality: N/A;  . LUNG LOBECTOMY Left 2003   "lower lobe"  . PERIPHERAL VASCULAR CATHETERIZATION  05/01/2014   Procedure: RENAL INTERVENTION;  Surgeon: Wellington Hampshire, MD; 6 x 15 mm Herculink stent to the Left Renal Artery  . PORTA CATH INSERTION N/A 09/26/2017   Procedure: PORTA CATH INSERTION;  Surgeon: Algernon Huxley, MD;  Location: [place] CV LAB;  Service: Cardiovascular;  Laterality:  N/A;  . RENAL ANGIOGRAM  04/24/2014  . RENAL ANGIOGRAM N/A 04/24/2014   Procedure: RENAL ANGIOGRAM;  Surgeon: Wellington Hampshire, MD;  Location: [place] [facility] CATH LAB;  Service: Cardiovascular;  Laterality: N/A;  . RENAL ANGIOGRAM Left 05/01/2014   Procedure: RENAL ANGIOGRAM;  Surgeon: Wellington Hampshire, MD;  Location: [place] CATH LAB;  Service: Cardiovascular;  Laterality: Left;  . REPLACEMENT TOTAL KNEE Left   . TOTAL HIP ARTHROPLASTY Right   . TUBAL LIGATION  ~ 1975  . VAGINAL DELIVERY  X 2  . VAGINAL HYSTERECTOMY  1980's    SOCIAL HISTORY: Social History   Socioeconomic History  . Marital status: Widowed    Spouse name: Not on file  . Number of children: Not on file  . Years of education: Not on file  . Highest education level: Not on file  Occupational History  . Not on file  Social Needs  . Financial resource strain: Not hard at all  . Food insecurity:    Worry: Never true    Inability: Never true  . Transportation needs:    Medical: No    Non-medical: No  Tobacco Use  . Smoking status: Never Smoker  . Smokeless tobacco: Never Used  . Tobacco comment: Father and husband both smokers  Substance and Sexual Activity  . Alcohol use: Yes    Comment: 04/24/2014 "glass of beer or wine q couple weeks"  . Drug use: No  . Sexual activity: Not on file  Lifestyle  . Physical activity:    Days per week: Not on file    Minutes per session: Not on file  . Stress: Not on file  Relationships  . Social connections:    Talks on phone: Not on file    Gets together: Not on file    Attends religious service: Not on file    Active member of club or organization: Not on file    Attends meetings of clubs or organizations: Not on file    Relationship status: Not on file  . Intimate partner violence:    Fear of current or ex partner: Not on file    Emotionally abused: Not on file    Physically abused: Not on file    Forced sexual activity: Not on file  Other Topics Concern  . Not on file  Social  History Narrative   Lives in [place]. Daughter nearby. Used to live in [place]   Normal ADLs as of 02/2017 and no h/o falls    Likes to walk   Lives alone     FAMILY HISTORY: Family History  Problem Relation Age of Onset  . Parkinsonism Mother   . Alzheimer's disease Mother   . Sarcoidosis Brother   . Stroke Maternal Grandmother   . Stomach cancer Paternal Grandmother   . Diabetes Son   . Diabetes Daughter     ALLERGIES:  is allergic to atorvastatin; penicillins; sulfa antibiotics; and amlodipine.  MEDICATIONS:  Current Outpatient Medications  Medication Sig Dispense Refill  . ALPRAZolam (XANAX) 0.25 MG tablet Take 0.25 mg by mouth daily as needed for anxiety. Take 1/2 tablet qd prn    . Ascorbic Acid (VITAMIN C CR) 1500 MG TBCR Take 1,500 mg by mouth daily.     Marland Kitchen aspirin 81 MG tablet Take 81 mg by mouth daily.    . Biotin 2500 MCG CAPS Take 1 capsule by mouth daily.    . Cholecalciferol (VITAMIN D3) 5000 units CAPS Take 1 capsule by mouth daily.    . Coenzyme Q10 (CO Q-10) 200 MG CAPS Take 1 capsule by mouth 2 (two) times daily.    Marland Kitchen docusate sodium (COLACE) 100 MG capsule TAKE 1CAP BY MOUTH DAILY AS NEEDED FOR MILD/MODERATE CONSTIPATION *DONT TAKE IF LOOSE STOOLS* 30 capsule 2  . folic acid (FOLVITE) 1 MG tablet TAKE 1 TABLET BY MOUTH EVERY DAY 30 tablet 3  . glucosamine-chondroitin 500-400 MG tablet Take 1 tablet by mouth 2 (two) times daily.    . hydrALAZINE (APRESOLINE) 25 MG tablet Take 1 tablet (25 mg total) by mouth 3 (three) times daily. (Patient taking differently: Take 25 mg by mouth 3 (three) times daily as needed. ) 90 tablet 6  . lidocaine-prilocaine (EMLA) cream Apply to affected area once 30 g 3  . Magnesium 500 MG CAPS Take 1 capsule by mouth daily.    Marland Kitchen PARoxetine (PAXIL) 10 MG tablet TAKE 1 TABLET BY MOUTH EVERY DAY 30 tablet 1  . prochlorperazine (COMPAZINE) 10 MG tablet Take 1 tablet (10 mg total) by mouth every 6 (six) hours as needed (Nausea or  vomiting). 30 tablet 1  . Red Yeast Rice Extract (RED YEAST RICE PO) Take 1,200 mg by mouth 2 (two) times daily.    . traMADol (ULTRAM) 50  MG tablet Take 1 tablet (50 mg total) by mouth daily as needed. For back pain/joint pain (Patient taking differently: Take 50 mg by mouth daily as needed for moderate pain. For back pain/joint pain) 90 tablet 0  . zinc gluconate 50 MG tablet Take 50 mg by mouth daily.     No current facility-administered medications for this visit.      PHYSICAL EXAMINATION: ECOG PERFORMANCE STATUS: 1 - Symptomatic but completely ambulatory Vitals:   05/08/18 0952  BP: (!) 171/89  Pulse: 85  Resp: 18  Temp: (!) 96.5 F (35.8 C)  SpO2: 98%   Filed Weights   05/08/18 0952  Weight: 117 lb 8 oz (53.3 kg)    Physical Exam Constitutional:      General: She is not in acute distress. HENT:     Head: Normocephalic and atraumatic.  Eyes:     General: No scleral icterus.    Conjunctiva/sclera: Conjunctivae normal.     Pupils: Pupils are equal, round, and reactive to light.  Neck:     Musculoskeletal: Normal range of motion and neck supple.  Cardiovascular:     Rate and Rhythm: Normal rate and regular rhythm.     Heart sounds: Normal heart sounds.  Pulmonary:     Effort: Pulmonary effort is normal. No respiratory distress.     Breath sounds: No wheezing or rales.     Comments: Slightly decreased breath sound at left lower base. Chest:     Chest wall: No tenderness.  Abdominal:     General: Bowel sounds are normal. There is no distension.     Palpations: Abdomen is soft. There is no mass.     Tenderness: There is no abdominal tenderness.  Musculoskeletal: Normal range of motion.        General: No deformity.  Lymphadenopathy:     Cervical: No cervical adenopathy.  Skin:    General: Skin is warm and dry.     Findings: No erythema or rash.  Neurological:     Mental Status: She is alert and oriented to person, place, and time.     Cranial Nerves: No  cranial nerve deficit.     Coordination: Coordination normal.  Psychiatric:        Behavior: Behavior normal.        Thought Content: Thought content normal.      LABORATORY DATA:  I have reviewed the data as listed Lab Results  Component Value Date   WBC 6.9 04/17/2018   HGB 10.5 (L) 04/17/2018   HCT 34.1 (L) 04/17/2018   MCV 81.6 04/17/2018   PLT 413 (H) 04/17/2018   Recent Labs    03/06/18 1316 03/27/18 1030 04/17/18 1012  NA 135 139 140  K 3.8 3.7 3.8  CL 99 101 102  CO2 27 27 28   GLUCOSE 137* 101* 108*  BUN 16 13 14   CREATININE 0.71 0.57 0.67  CALCIUM 9.3 9.1 9.3  GFRNONAA >60 >60 >60  GFRAA >60 >60 >60  PROT 7.0 7.1 7.1  ALBUMIN 3.3* 3.4* 3.6  AST 27 28 28   ALT 15 16 14   ALKPHOS 101 104 127*  BILITOT 0.3 0.4 0.6   RADIOGRAPHIC STUDIES: I have personally reviewed the radiological images as listed and agreed with the findings in the report. 12/06/2017 PET scan was independently reviewed by me and discussed with patient and her friends.  PET scan showed notable reduced activity in the pleural metastatic disease on the left.  Reduce lingular mass activity.  There was a loculated left pleural effusion has moderately enlarged.  Currently no definite adrenal hypermetabolic activity.  Chronic findings include aortic atherosclerosis, coronary atherosclerosis, nonobstructing right nephrolithiasis.  Dense mitral calcification    ASSESSMENT & PLAN:  85 y.o. female who has a history of left lower lobe primary lung adenocarcinoma present for evaluation of diagnosed 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 1. 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 Malignant neoplasm of unspecified part of unspecified bronchus or lung ([place])   3. Malignant neoplasm of left lung, unspecified part of lung ([place])   4. Encounter for antineoplastic chemotherapy   5. Encounter for antineoplastic immunotherapy   6. Anemia in neoplastic disease   7. Hypertension, unspecified type   8.  Goals of care, counseling/discussion   Cancer Staging Malignant neoplasm of unspecified part of unspecified bronchus or lung ([place]) Staging form: Lung, AJCC 8th Edition - Clinical stage from 09/02/2017: Stage IV (cT2a, cN0, pM1a) - Signed by Earlie Server, MD on 09/02/2017  #Stage IV left lower lobe primary lung adenocarcinoma with pleural involvement. Stable disease.  Tolerates maintenance Alimta and Keytruda well.  Labs reviewed and discussed with patient. Counts acceptable to proceed with today's Alimta and Keytruda treatment. Continue folic acid today. She will proceed with vitamin B12 every 9 weeks.  Proceed with 1 dose today. We will proceed with CT chest without contrast for surveillance prior to next visit.  #Normal Citic anemia, hemoglobin slightly worsened.  Trended down to 10.  Discussed with patient that decreased hemoglobin may be related to her fatigue symptoms. Continue close monitor for now.   #Anxiety, well controlled.  Continue Paxil 10 mg daily. #Hypertension, blood pressure need to be further optimized.  Advised patient to resume losartan.  Advised patient to follow-up with primary care physician for further titration of blood pressure control  #Chronic fatigue, at baseline.  TSH stable.  Continue monitor. We spent sufficient time to discuss many aspect of care, questions were answered to patient's satisfaction. Total face to face encounter time for this patient visit was 25 min. >50% of the time was  spent in counseling and coordination of care.  Return of visit:  3 weeks for maintenance chemotherapy and immunotherapy.  Earlie Server, MD, PhD Hematology Oncology [facility] Pager- [phone] 05/08/2018

## 2018-05-08 NOTE — Progress Notes (Signed)
Patient here for follow up. Pt having right leg pain "kind of like a graps."  Blood pressure is elevated, denies dizziness or lightheadedness.

## 2018-05-11 DIAGNOSIS — R69 Illness, unspecified: Secondary | ICD-10-CM | POA: Diagnosis not present

## 2018-05-17 DIAGNOSIS — R69 Illness, unspecified: Secondary | ICD-10-CM | POA: Diagnosis not present

## 2018-05-23 ENCOUNTER — Ambulatory Visit
Admission: RE | Admit: 2018-05-23 | Discharge: 2018-05-23 | Disposition: A | Discharge status: Home / Self Care | Payer: Medicare HMO | Source: Ambulatory Visit | Attending: Oncology | Admitting: Oncology

## 2018-05-23 DIAGNOSIS — C3490 Malignant neoplasm of unspecified part of unspecified bronchus or lung: Secondary | ICD-10-CM | POA: Insufficient documentation

## 2018-05-23 DIAGNOSIS — C782 Secondary malignant neoplasm of pleura: Secondary | ICD-10-CM | POA: Insufficient documentation

## 2018-05-23 DIAGNOSIS — R918 Other nonspecific abnormal finding of lung field: Secondary | ICD-10-CM | POA: Diagnosis not present

## 2018-05-26 ENCOUNTER — Other Ambulatory Visit: Payer: Self-pay | Admitting: *Deleted

## 2018-05-26 MED ORDER — DOCUSATE SODIUM 100 MG PO CAPS: ORAL_CAPSULE | ORAL | 2 refills | Status: AC

## 2018-05-26 MED ORDER — FOLIC ACID 1 MG PO TABS: 1.0000 mg | ORAL_TABLET | Freq: Every day | ORAL | 3 refills | Status: AC

## 2018-05-29 ENCOUNTER — Other Ambulatory Visit: Payer: Self-pay

## 2018-05-29 ENCOUNTER — Inpatient Hospital Stay (HOSPITAL_BASED_OUTPATIENT_CLINIC_OR_DEPARTMENT_OTHER): Payer: Medicare HMO | Admitting: Oncology

## 2018-05-29 ENCOUNTER — Inpatient Hospital Stay: Payer: Medicare HMO

## 2018-05-29 ENCOUNTER — Encounter: Payer: Self-pay | Admitting: Oncology

## 2018-05-29 VITALS — BP 135/77 | HR 89 | Temp 96.1°F | Resp 18 | Wt 116.4 lb

## 2018-05-29 DIAGNOSIS — C3492 Malignant neoplasm of unspecified part of left bronchus or lung: Secondary | ICD-10-CM

## 2018-05-29 DIAGNOSIS — Z5112 Encounter for antineoplastic immunotherapy: Secondary | ICD-10-CM | POA: Diagnosis not present

## 2018-05-29 DIAGNOSIS — R530 Neoplastic (malignant) related fatigue: Secondary | ICD-10-CM

## 2018-05-29 DIAGNOSIS — Z5111 Encounter for antineoplastic chemotherapy: Secondary | ICD-10-CM

## 2018-05-29 DIAGNOSIS — C782 Secondary malignant neoplasm of pleura: Secondary | ICD-10-CM

## 2018-05-29 DIAGNOSIS — D630 Anemia in neoplastic disease: Secondary | ICD-10-CM

## 2018-05-29 DIAGNOSIS — C3490 Malignant neoplasm of unspecified part of unspecified bronchus or lung: Secondary | ICD-10-CM

## 2018-05-29 LAB — COMPREHENSIVE METABOLIC PANEL
ALT: 13 U/L (ref 0–44)
AST: 23 U/L (ref 15–41)
Albumin: 3.3 g/dL — ABNORMAL LOW (ref 3.5–5.0)
Alkaline Phosphatase: 116 U/L (ref 38–126)
Anion gap: 9 (ref 5–15)
BUN: 16 mg/dL (ref 8–23)
CHLORIDE: 98 mmol/L (ref 98–111)
CO2: 27 mmol/L (ref 22–32)
Calcium: 9 mg/dL (ref 8.9–10.3)
Creatinine, Ser: 0.51 mg/dL (ref 0.44–1.00)
GFR calc Af Amer: >60 mL/min (ref >60)
GFR calc non Af Amer: >60 mL/min (ref >60)
Glucose, Bld: 108 mg/dL — ABNORMAL HIGH (ref 70–99)
Potassium: 3.7 mmol/L (ref 3.5–5.1)
Sodium: 134 mmol/L — ABNORMAL LOW (ref 135–145)
Total Bilirubin: 0.4 mg/dL (ref 0.3–1.2)
Total Protein: 6.9 g/dL (ref 6.5–8.1)

## 2018-05-29 LAB — TSH: TSH: 1.472 u[IU]/mL (ref 0.350–4.500)

## 2018-05-29 LAB — CBC WITH DIFFERENTIAL/PLATELET
Abs Immature Granulocytes: 0.02 10*3/uL (ref 0.00–0.07)
Basophils Absolute: 0.1 10*3/uL (ref 0.0–0.1)
Basophils Relative: 1 %
Eosinophils Absolute: 0.2 10*3/uL (ref 0.0–0.5)
Eosinophils Relative: 2 %
HCT: 32.2 % — ABNORMAL LOW (ref 36.0–46.0)
HEMOGLOBIN: 10 g/dL — AB (ref 12.0–15.0)
Immature Granulocytes: 0 %
LYMPHS PCT: 14 %
Lymphs Abs: 1.1 10*3/uL (ref 0.7–4.0)
MCH: 24.9 pg — ABNORMAL LOW (ref 26.0–34.0)
MCHC: 31.1 g/dL (ref 30.0–36.0)
MCV: 80.1 fL (ref 80.0–100.0)
MONOS PCT: 13 %
Monocytes Absolute: 1 10*3/uL (ref 0.1–1.0)
Neutro Abs: 5.4 10*3/uL (ref 1.7–7.7)
Neutrophils Relative %: 70 %
Platelets: 389 10*3/uL (ref 150–400)
RBC: 4.02 MIL/uL (ref 3.87–5.11)
RDW: 17.8 % — ABNORMAL HIGH (ref 11.5–15.5)
WBC: 7.7 10*3/uL (ref 4.0–10.5)
nRBC: 0 % (ref 0.0–0.2)

## 2018-05-29 MED ORDER — ONDANSETRON HCL 4 MG/2ML IJ SOLN
8.0000 mg | Freq: Once | INTRAMUSCULAR | Status: AC
  Administered 2018-05-29: 8 mg via INTRAVENOUS

## 2018-05-29 MED ORDER — DEXAMETHASONE SODIUM PHOSPHATE 10 MG/ML IJ SOLN
10.0000 mg | Freq: Once | INTRAMUSCULAR | Status: AC
  Administered 2018-05-29: 10 mg via INTRAVENOUS

## 2018-05-29 MED ORDER — SODIUM CHLORIDE 0.9 % IV SOLN
700.0000 mg | Freq: Once | INTRAVENOUS | Status: AC
  Administered 2018-05-29: 700 mg via INTRAVENOUS
  Filled 2018-05-29: qty 8

## 2018-05-29 MED ORDER — SODIUM CHLORIDE 0.9 % IV SOLN
200.0000 mg | Freq: Once | INTRAVENOUS | Status: AC
  Administered 2018-05-29: 200 mg via INTRAVENOUS
  Filled 2018-05-29: qty 8

## 2018-05-29 MED ORDER — HEPARIN SOD (PORK) LOCK FLUSH 100 UNIT/ML IV SOLN
500.0000 [IU] | Freq: Once | INTRAVENOUS | Status: AC
  Administered 2018-05-29: 500 [IU] via INTRAVENOUS

## 2018-05-29 MED ORDER — SODIUM CHLORIDE 0.9 % IV SOLN
Freq: Once | INTRAVENOUS | Status: AC
  Administered 2018-05-29: 10:00:00 via INTRAVENOUS
  Filled 2018-05-29: qty 250

## 2018-05-29 MED ORDER — SODIUM CHLORIDE 0.9% FLUSH
10.0000 mL | Freq: Once | INTRAVENOUS | Status: AC
  Administered 2018-05-29: 10 mL via INTRAVENOUS
  Filled 2018-05-29: qty 10

## 2018-05-29 NOTE — Progress Notes (Signed)
Patient here for follow up. Pt states has been taking BP medication as needed. Did not take medication this morning. Pt complains of being tired most of the time and legs being weaker.

## 2018-05-29 NOTE — Progress Notes (Signed)
Hematology/Oncology follow up note [facility] [phone] [phone]   Patient Care Team: McLean-Scocuzza, Nino Glow, MD as PCP - General (Internal Medicine) Wellington Hampshire, MD as Consulting Physician (Cardiology) Telford Nab, RN as Registered Nurse  REFERRING PROVIDER: [place] VISIT  follow-up for assessment after chemotherapy treatment for stage IV lung adenocarcinoma. HISTORY OF PRESENTING ILLNESS:  Tamara Burton is a  85 y.o.  female with PMH listed below who was referred to me for evaluation of lung cancer.  Patient reports a remote history of lung cancer in 2003 and status post left lower lobectomy.  We obtained medical  records from [facility] in [place].  Reviewed pathology results.  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  Perihilar lymph node negative.  Peri-bronchial lymph node negative. Patient is accompanied by her friend who is her house health power of attorney Patient has noticed mild chronic left side chest wall pain which she described as a stitch, for about 6 months to a year.  Pain is very mild and she takes Tylenol or tramadol as needed.  Pain got little worse early this year. 07/15/2017 CT chest with contrast showed new left pleural process since prior CT from 2016.  Findings most suspicious for pleural tumor with complex pleural fluid.  Medial left upper lobe atelectasis without obvious tumor. 08/16/2017 PET scan showed multifocal left side pleural hypermetabolic is him and a soft tissue thickening, consistent with pleural metastasis Hyper metabolic soft tissue density within the lingula, suspicious for primary bronchogenic carcinoma.   There is also left adrenal hypermetabolic them is nonspecific and without CT correlate.  Otherwise no evidence of extra thoracic metastatic  disease.  Right nephrolithiasis.  # 08/26/2017 CT biopsy of left lower chest pleura showed positive for adenocarcinoma.   Patient's case was discussed on tumor board on Sep 01, 2017.  Per pathology, there is no enough tissue for foundation one testing or Omniseq.  Patient was referred by pulmonology Dr. Shawna Orleans to me to discuss about management plan.  INTERVAL HISTORY Tamara Burton is a 85 y.o. female who has above history reviewed by me today presents for assessment prior to chemotherapy with maintenance Alimta and immunotherapy.  Reports feeling well at baseline.  After each chemotherapy treatment, she feels more tired during the interval and gets better when she is close to the next treatment. She is now monitoring her blood pressure at home and takes blood pressure medications if blood pressures are high. Denies any shortness of breath, diarrhea, chest pain, abdominal pain Right arthritic hip pain is stable. Continues to be active and walks every day. Appetite is fair.  Patient feels that appetite comes and goes.  She feels that she is eating pretty well. She went out for movie night with her friend Lelon Frohlich   #Oncology treatment Status post 4 cycles of carbo/Alimta/Keytruda [based on Keynote 189 study, triplet improved ORR, PFS, and OS]. Starting cycle 5, patient was started on Alimta and Keytruda Q21days maintenance.  Review of Systems  Constitutional: Positive for malaise/fatigue. Negative for chills, fever and weight loss.  HENT: Negative for sore throat.   Eyes: Negative for redness.  Respiratory: Negative for cough, shortness of breath and wheezing.   Cardiovascular: Negative for chest pain, palpitations and leg swelling.       Intermittent left lower chest wall aches  Gastrointestinal: Negative for abdominal pain, blood in stool, nausea  and vomiting.  Genitourinary: Negative for dysuria.  Musculoskeletal: Positive for joint pain. Negative for myalgias.  Skin: Negative for  rash.  Neurological: Negative for dizziness, tingling and tremors.  Endo/Heme/Allergies: Does not bruise/bleed easily.  Psychiatric/Behavioral: Negative for hallucinations.    MEDICAL HISTORY:  Past Medical History:  Diagnosis Date  . Arthritis    "qwhere"  . Carotid artery disease ([place])   . Chronic lower back pain   . Colon polyps    benign per pt   . Epistaxis    with use of nasal sprays   . Headache    "lately; related to HTN" (04/24/2014)  . Hyperlipidemia   . Hypertension   . Hyponatremia    h/o  . Kidney stone   . Lung cancer ([place]) 2003   s/p left lower lobe resection  . Non-obstructive CAD    a. 04/2014 Cath: LM nl, LAD/D1/LCX/RCA min irregs, RPDA/PAV nl.  . Orthostatic hypotension   . OSA on CPAP    "wear it intermittently"  . PONV (postoperative nausea and vomiting)    "when I was younger"  . Renal artery stenosis ([place])    a. 04/2014 Renal angiography: LRA 95p, RRA 40ost. PTA of LRA deferred 2/2 guide cath induced Ao dissection;  01/27 6 x 15 mm Herculink stent to the Left-RA  follows with [facility] Dr. Ronalee Belts  . Sinus headache     SURGICAL HISTORY: Past Surgical History:  Procedure Laterality Date  . APPENDECTOMY    . BILATERAL OOPHORECTOMY Bilateral 2000's  . CARDIAC CATHETERIZATION  04/24/2014  . CATARACT EXTRACTION W/ INTRAOCULAR LENS  IMPLANT, BILATERAL Bilateral   . JOINT REPLACEMENT    . LEFT HEART CATHETERIZATION WITH CORONARY ANGIOGRAM N/A 04/24/2014   Procedure: LEFT HEART CATHETERIZATION WITH CORONARY ANGIOGRAM;  Surgeon: Wellington Hampshire, MD;  Location: [facility] CATH LAB;  Service: Cardiovascular;  Laterality: N/A;  . LUNG LOBECTOMY Left 2003   "lower lobe"  . PERIPHERAL VASCULAR CATHETERIZATION  05/01/2014   Procedure: RENAL INTERVENTION;  Surgeon: Wellington Hampshire, MD; 6 x 15 mm Herculink stent to the Left Renal Artery  . PORTA CATH INSERTION N/A 09/26/2017   Procedure: PORTA CATH INSERTION;  Surgeon: Algernon Huxley, MD;  Location: [facility] CV LAB;   Service: Cardiovascular;  Laterality: N/A;  . RENAL ANGIOGRAM  04/24/2014  . RENAL ANGIOGRAM N/A 04/24/2014   Procedure: RENAL ANGIOGRAM;  Surgeon: Wellington Hampshire, MD;  Location: [facility] CATH LAB;  Service: Cardiovascular;  Laterality: N/A;  . RENAL ANGIOGRAM Left 05/01/2014   Procedure: RENAL ANGIOGRAM;  Surgeon: Wellington Hampshire, MD;  Location: [place] [facility] CATH LAB;  Service: Cardiovascular;  Laterality: Left;  . REPLACEMENT TOTAL KNEE Left   . TOTAL HIP ARTHROPLASTY Right   . TUBAL LIGATION  ~ 1975  . VAGINAL DELIVERY  X 2  . VAGINAL HYSTERECTOMY  1980's    SOCIAL HISTORY: Social History   Socioeconomic History  . Marital status: Widowed    Spouse name: Not on file  . Number of children: Not on file  . Years of education: Not on file  . Highest education level: Not on file  Occupational History  . Not on file  Social Needs  . Financial resource strain: Not hard at all  . Food insecurity:    Worry: Never true    Inability: Never true  . Transportation needs:    Medical: No    Non-medical: No  Tobacco Use  . Smoking status: Never Smoker  . Smokeless tobacco: Never Used  .  Tobacco comment: Father and husband both smokers  Substance and Sexual Activity  . Alcohol use: Yes    Comment: 04/24/2014 "glass of beer or wine q couple weeks"  . Drug use: No  . Sexual activity: Not on file  Lifestyle  . Physical activity:    Days per week: Not on file    Minutes per session: Not on file  . Stress: Not on file  Relationships  . Social connections:    Talks on phone: Not on file    Gets together: Not on file    Attends religious service: Not on file    Active member of club or organization: Not on file    Attends meetings of clubs or organizations: Not on file    Relationship status: Not on file  . Intimate partner violence:    Fear of current or ex partner: Not on file    Emotionally abused: Not on file    Physically abused: Not on file    Forced sexual activity: Not on file  Other  Topics Concern  . Not on file  Social History Narrative   Lives in [place]. Daughter nearby. Used to live in [place]   Normal ADLs as of 02/2017 and no h/o falls    Likes to walk   Lives alone     FAMILY HISTORY: Family History  Problem Relation Age of Onset  . Parkinsonism Mother   . Alzheimer's disease Mother   . Sarcoidosis Brother   . Stroke Maternal Grandmother   . Stomach cancer Paternal Grandmother   . Diabetes Son   . Diabetes Daughter     ALLERGIES:  is allergic to atorvastatin; penicillins; sulfa antibiotics; and amlodipine.  MEDICATIONS:  Current Outpatient Medications  Medication Sig Dispense Refill  . ALPRAZolam (XANAX) 0.25 MG tablet Take 0.25 mg by mouth daily as needed for anxiety. Take 1/2 tablet qd prn    . Ascorbic Acid (VITAMIN C CR) 1500 MG TBCR Take 1,500 mg by mouth daily.     Marland Kitchen aspirin 81 MG tablet Take 81 mg by mouth daily.    . Biotin 2500 MCG CAPS Take 1 capsule by mouth daily.    . carvedilol (COREG) 12.5 MG tablet Take 12.5 mg by mouth 2 (two) times daily as needed.    . Cholecalciferol (VITAMIN D3) 5000 units CAPS Take 1 capsule by mouth daily.    . Coenzyme Q10 (CO Q-10) 200 MG CAPS Take 1 capsule by mouth 2 (two) times daily.    Marland Kitchen docusate sodium (COLACE) 100 MG capsule TAKE 1CAP BY MOUTH DAILY AS NEEDED FOR MILD/MODERATE CONSTIPATION *DONT TAKE IF LOOSE STOOLS* 30 capsule 2  . folic acid (FOLVITE) 1 MG tablet Take 1 tablet (1 mg total) by mouth daily. 30 tablet 3  . glucosamine-chondroitin 500-400 MG tablet Take 1 tablet by mouth 2 (two) times daily.    . hydrALAZINE (APRESOLINE) 25 MG tablet Take 1 tablet (25 mg total) by mouth 3 (three) times daily. (Patient taking differently: Take 25 mg by mouth 3 (three) times daily as needed. ) 90 tablet 6  . lidocaine-prilocaine (EMLA) cream Apply to affected area once 30 g 3  . losartan-hydrochlorothiazide (HYZAAR) 100-25 MG tablet Take 1 tablet by mouth daily as needed.    . Magnesium 500 MG  CAPS Take 1 capsule by mouth daily.    Marland Kitchen PARoxetine (PAXIL) 10 MG tablet TAKE 1 TABLET BY MOUTH EVERY DAY 30 tablet 1  . prochlorperazine (COMPAZINE) 10 MG  tablet Take 1 tablet (10 mg total) by mouth every 6 (six) hours as needed (Nausea or vomiting). 30 tablet 1  . Red Yeast Rice Extract (RED YEAST RICE PO) Take 1,200 mg by mouth 2 (two) times daily.    . traMADol (ULTRAM) 50 MG tablet Take 1 tablet (50 mg total) by mouth daily as needed. For back pain/joint pain (Patient taking differently: Take 50 mg by mouth daily as needed for moderate pain. For back pain/joint pain) 90 tablet 0  . zinc gluconate 50 MG tablet Take 50 mg by mouth daily.     No current facility-administered medications for this visit.      PHYSICAL EXAMINATION: ECOG PERFORMANCE STATUS: 1 - Symptomatic but completely ambulatory Vitals:   05/29/18 0913  BP: 135/77  Pulse: 89  Resp: 18  Temp: (!) 96.1 F (35.6 C)  SpO2: 97%   Filed Weights   05/29/18 0913  Weight: 116 lb 6.4 oz (52.8 kg)    Physical Exam Constitutional:      General: She is not in acute distress. HENT:     Head: Normocephalic and atraumatic.  Eyes:     General: No scleral icterus.    Conjunctiva/sclera: Conjunctivae normal.     Pupils: Pupils are equal, round, and reactive to light.  Neck:     Musculoskeletal: Normal range of motion and neck supple.  Cardiovascular:     Rate and Rhythm: Normal rate and regular rhythm.     Heart sounds: Normal heart sounds.  Pulmonary:     Effort: Pulmonary effort is normal. No respiratory distress.     Breath sounds: No wheezing or rales.     Comments: Slightly decreased breath sound at left lower base. Chest:     Chest wall: No tenderness.  Abdominal:     General: Bowel sounds are normal. There is no distension.     Palpations: Abdomen is soft. There is no mass.     Tenderness: There is no abdominal tenderness.  Musculoskeletal: Normal range of motion.        General: No deformity.    Lymphadenopathy:     Cervical: No cervical adenopathy.  Skin:    General: Skin is warm and dry.     Findings: No erythema or rash.  Neurological:     Mental Status: She is alert and oriented to person, place, and time.     Cranial Nerves: No cranial nerve deficit.     Coordination: Coordination normal.  Psychiatric:        Behavior: Behavior normal.        Thought Content: Thought content normal.     LABORATORY DATA:  I have reviewed the data as listed Lab Results  Component Value Date   WBC 7.7 05/29/2018   HGB 10.0 (L) 05/29/2018   HCT 32.2 (L) 05/29/2018   MCV 80.1 05/29/2018   PLT 389 05/29/2018   Recent Labs    04/17/18 1012 05/08/18 0930 05/29/18 0846  NA 140 134* 134*  K 3.8 4.0 3.7  CL 102 101 98  CO2 28 28 27   GLUCOSE 108* 109* 108*  BUN 14 14 16   CREATININE 0.67 0.84 0.51  CALCIUM 9.3 8.8* 9.0  GFRNONAA >60 >60 >60  GFRAA >60 >60 >60  PROT 7.1 7.0 6.9  ALBUMIN 3.6 3.4* 3.3*  AST 28 23 23   ALT 14 11 13   ALKPHOS 127* 118 116  BILITOT 0.6 0.3 0.4   RADIOGRAPHIC STUDIES: I have personally reviewed the radiological images as  listed and agreed with the findings in the report. 12/06/2017 PET scan was independently reviewed by me and discussed with patient and her friends.  PET scan showed notable reduced activity in the pleural metastatic disease on the left.  Reduce lingular mass activity.  There was a loculated left pleural effusion has moderately enlarged.  Currently no definite adrenal hypermetabolic activity.  Chronic findings include aortic atherosclerosis, coronary atherosclerosis, nonobstructing right nephrolithiasis.  Dense mitral calcification    ASSESSMENT & PLAN:  85 y.o. female who has a history of left lower lobe primary lung adenocarcinoma present for evaluation of diagnosed 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 1. Malignant neoplasm of left lung, unspecified part of lung ([place])   2. Encounter for antineoplastic  chemotherapy   3. 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 Encounter for antineoplastic immunotherapy   5. Anemia in neoplastic disease   6. Neoplastic malignant related fatigue   Cancer Staging Malignant neoplasm of unspecified part of unspecified bronchus or lung ([place]) Staging form: Lung, AJCC 8th Edition - Clinical stage from 09/02/2017: Stage IV (cT2a, cN0, pM1a) - Signed by Earlie Server, MD on 09/02/2017  #Stage IV left lower lobe primary lung adenocarcinoma with pleural involvement. CT chest was independently reviewed and discussed with patient and her friend. Stable disease.  She tolerates maintenance Alimta and Keytruda well. Labs reviewed and discussed with patient .  Counts acceptable to proceed with today's Alimta and Keytruda treatment Continue folic acid today. She needs vitamin B12 every 9 weeks.  Last dose was given on 05/08/2018 Stable disease.  Tolerates maintenance Alimta and Keytruda well. We will continue to have CT chest every 3 months.  #Normocytic anemia hemoglobin stable at 10.  Continue to monitor.  #Anxiety, well controlled.  Continue Paxil 10 mg daily. #Hypertension, blood pressure is 135/77 today.  Stable.   #Chronic fatigue, at baseline.  Check TSH today. . Patient's friend [name] asked if patient can have a chemo break as patient's son is coming to town. I had a lengthy discussion with patient's friend and the patient.  If she feels current chemotherapy and immunotherapy is overwhelming and decreasing her life quality, can consider a chemo break.  If she feels well, clinically she tolerates treatment pretty well, recommendation is to continue with current treatment with no interruption. Patient decides to proceed with treatment as planned.  Return of visit:  3 weeks for maintenance chemotherapy and immunotherapy.  Earlie Server, MD, PhD Hematology Oncology [facility] at [facility] Pager- [phone] 05/29/2018

## 2018-05-30 ENCOUNTER — Ambulatory Visit: Payer: Medicare HMO

## 2018-06-13 DIAGNOSIS — H903 Sensorineural hearing loss, bilateral: Secondary | ICD-10-CM | POA: Diagnosis not present

## 2018-06-19 ENCOUNTER — Telehealth: Payer: Self-pay | Admitting: *Deleted

## 2018-06-19 ENCOUNTER — Other Ambulatory Visit: Payer: Self-pay

## 2018-06-19 ENCOUNTER — Emergency Department
Admission: EM | Admit: 2018-06-19 | Discharge: 2018-06-19 | Disposition: A | Discharge status: Home / Self Care | Payer: Medicare HMO | Attending: Emergency Medicine | Admitting: Emergency Medicine

## 2018-06-19 ENCOUNTER — Inpatient Hospital Stay: Payer: Medicare HMO

## 2018-06-19 ENCOUNTER — Inpatient Hospital Stay: Payer: Medicare HMO | Admitting: Oncology

## 2018-06-19 ENCOUNTER — Encounter: Payer: Self-pay | Admitting: Emergency Medicine

## 2018-06-19 ENCOUNTER — Emergency Department: Payer: Medicare HMO

## 2018-06-19 DIAGNOSIS — I2510 Atherosclerotic heart disease of native coronary artery without angina pectoris: Secondary | ICD-10-CM | POA: Insufficient documentation

## 2018-06-19 DIAGNOSIS — I10 Essential (primary) hypertension: Secondary | ICD-10-CM | POA: Diagnosis not present

## 2018-06-19 DIAGNOSIS — Z79899 Other long term (current) drug therapy: Secondary | ICD-10-CM | POA: Diagnosis not present

## 2018-06-19 DIAGNOSIS — R0602 Shortness of breath: Secondary | ICD-10-CM | POA: Diagnosis not present

## 2018-06-19 DIAGNOSIS — Z96641 Presence of right artificial hip joint: Secondary | ICD-10-CM | POA: Insufficient documentation

## 2018-06-19 DIAGNOSIS — R9431 Abnormal electrocardiogram [ECG] [EKG]: Secondary | ICD-10-CM | POA: Diagnosis not present

## 2018-06-19 DIAGNOSIS — Z96652 Presence of left artificial knee joint: Secondary | ICD-10-CM | POA: Insufficient documentation

## 2018-06-19 DIAGNOSIS — R05 Cough: Secondary | ICD-10-CM | POA: Diagnosis not present

## 2018-06-19 LAB — CBC WITH DIFFERENTIAL/PLATELET
Basophils Absolute: 0.1 10*3/uL (ref 0.0–0.1)
Basophils Relative: 0 %
EOS ABS: 0.3 10*3/uL (ref 0.0–0.5)
EOS PCT: 2 %
HCT: 30.2 % — ABNORMAL LOW (ref 36.0–46.0)
Hemoglobin: 9.5 g/dL — ABNORMAL LOW (ref 12.0–15.0)
Lymphocytes Relative: 11 %
Lymphs Abs: 1.3 10*3/uL (ref 0.7–4.0)
MCH: 24.8 pg — ABNORMAL LOW (ref 26.0–34.0)
MCHC: 31.5 g/dL (ref 30.0–36.0)
MCV: 78.9 fL — ABNORMAL LOW (ref 80.0–100.0)
Monocytes Absolute: 1.5 10*3/uL — ABNORMAL HIGH (ref 0.1–1.0)
Monocytes Relative: 13 %
Neutro Abs: 8.7 10*3/uL — ABNORMAL HIGH (ref 1.7–7.7)
Neutrophils Relative %: 74 %
PLATELETS: 465 10*3/uL — AB (ref 150–400)
RBC: 3.83 MIL/uL — ABNORMAL LOW (ref 3.87–5.11)
RDW: 16.8 % — AB (ref 11.5–15.5)
WBC: 11.9 10*3/uL — ABNORMAL HIGH (ref 4.0–10.5)
nRBC: 0 % (ref 0.0–0.2)

## 2018-06-19 LAB — BASIC METABOLIC PANEL
Anion gap: 11 (ref 5–15)
BUN: 16 mg/dL (ref 8–23)
CO2: 25 mmol/L (ref 22–32)
Calcium: 8.8 mg/dL — ABNORMAL LOW (ref 8.9–10.3)
Chloride: 98 mmol/L (ref 98–111)
Creatinine, Ser: 0.69 mg/dL (ref 0.44–1.00)
GFR calc Af Amer: >60 mL/min (ref >60)
GFR calc non Af Amer: >60 mL/min (ref >60)
GLUCOSE: 111 mg/dL — AB (ref 70–99)
Potassium: 3.3 mmol/L — ABNORMAL LOW (ref 3.5–5.1)
Sodium: 134 mmol/L — ABNORMAL LOW (ref 135–145)

## 2018-06-19 LAB — CBC
HCT: 30.4 % — ABNORMAL LOW (ref 36.0–46.0)
Hemoglobin: 9.6 g/dL — ABNORMAL LOW (ref 12.0–15.0)
MCH: 25.1 pg — ABNORMAL LOW (ref 26.0–34.0)
MCHC: 31.6 g/dL (ref 30.0–36.0)
MCV: 79.6 fL — ABNORMAL LOW (ref 80.0–100.0)
Platelets: 413 10*3/uL — ABNORMAL HIGH (ref 150–400)
RBC: 3.82 MIL/uL — ABNORMAL LOW (ref 3.87–5.11)
RDW: 16.7 % — AB (ref 11.5–15.5)
WBC: 11.9 10*3/uL — ABNORMAL HIGH (ref 4.0–10.5)
nRBC: 0 % (ref 0.0–0.2)

## 2018-06-19 LAB — TROPONIN I: Troponin I: <0.03 ng/mL (ref <0.03)

## 2018-06-19 MED ORDER — SODIUM CHLORIDE 0.9% FLUSH
10.0000 mL | INTRAVENOUS | Status: DC | PRN
  Filled 2018-06-19: qty 10

## 2018-06-19 MED ORDER — HEPARIN SOD (PORK) LOCK FLUSH 100 UNIT/ML IV SOLN
INTRAVENOUS | Status: AC
  Administered 2018-06-19: 14:00:00
  Filled 2018-06-19: qty 5

## 2018-06-19 MED ORDER — HEPARIN SOD (PORK) LOCK FLUSH 100 UNIT/ML IV SOLN: 500.0000 [IU] | Freq: Once | INTRAVENOUS | Status: DC

## 2018-06-19 MED ORDER — DOXYCYCLINE HYCLATE 100 MG PO CAPS: 100.0000 mg | ORAL_CAPSULE | Freq: Two times a day (BID) | ORAL | 0 refills | Status: AC

## 2018-06-19 NOTE — Telephone Encounter (Signed)
Received message this morning from POA stating that pt has been short of breath for the past several days, also has had out of state visitors since last week.  Pt was scheduled for lab, see dr Tasia Catchings and chemotherapy today.  I spoke with Dr Tasia Catchings, she asked for appointments to be cancelled and advised pt to be seen at ED for worsening SOB.  I met the pt in the lobby as she was checking in, told her the recommendations of Dr Tasia Catchings. Pt agreed.  Dr Tasia Catchings reviewed ED notes and xray results, notified pt that we would get her scheduled to see dr Tasia Catchings along with chemo treatment in 2 weeks.

## 2018-06-19 NOTE — ED Triage Notes (Signed)
Says she went to cancer center fo rtreatment today and they sent her here for short ness of breath.  Says this has been going on for 2 weeks and is worsening.  Says weak, but had a lot of company over the weekend.  She has cough, but not different than usual and denies fever.

## 2018-06-19 NOTE — Discharge Instructions (Signed)
As we discussed the x-ray findings are consistent with possible pneumonia or drug reaction. If you do start running a fever or feeling worse you should be seen again. Please follow up with your oncologist. Please seek medical attention for any high fevers, chest pain, shortness of breath, change in behavior, persistent vomiting, bloody stool or any other new or concerning symptoms.

## 2018-06-19 NOTE — ED Notes (Signed)
ED Provider at bedside. 

## 2018-06-19 NOTE — ED Notes (Signed)

## 2018-06-19 NOTE — ED Provider Notes (Signed)
[facility] Emergency Department Provider Note   ____________________________________________   I have reviewed the triage vital signs and the nursing notes.   HISTORY  Chief Complaint Shortness of Breath   History limited by: Not Limited   HPI Tamara Burton is a 85 y.o. female who presents to the emergency department today from oncology clinic because of concern for shortness of breath. Patient was at oncology clinic for an infusion. States that she is getting it weekly. Today they discussed with the staff that she had been short of breath. She says she has been short of breath for roughly a week and that it comes and goes. Close friend however states that it started last night, but again patient states it has been going on for a little over one week. The patient has not had any associated chest pain with her shortness of breath. She denies any fevers. Has a chronic cough which has not changed. Denies any vomiting or diarrhea.   Per medical record review patient has a history of CAD, lung cancer.   Past Medical History:  Diagnosis Date  . Arthritis    "qwhere"  . Carotid artery disease ([place])   . Chronic lower back pain   . Colon polyps    benign per pt   . Epistaxis    with use of nasal sprays   . Headache    "lately; related to HTN" (04/24/2014)  . Hyperlipidemia   . Hypertension   . Hyponatremia    h/o  . Kidney stone   . Lung cancer ([place]) 2003   s/p left lower lobe resection  . Non-obstructive CAD    a. 04/2014 Cath: LM nl, LAD/D1/LCX/RCA min irregs, RPDA/PAV nl.  . Orthostatic hypotension   . OSA on CPAP    "wear it intermittently"  . PONV (postoperative nausea and vomiting)    "when I was younger"  . Renal artery stenosis ([place])    a. 04/2014 Renal angiography: LRA 95p, RRA 40ost. PTA of LRA deferred 2/2 guide cath induced Ao dissection;  01/27 6 x 15 mm Herculink stent to the Left-RA  follows with [facility] Dr. Ronalee Belts  . Sinus headache      Patient Active Problem List   Diagnosis Date Noted  . Encounter for antineoplastic chemotherapy 05/08/2018  . Anxiety 02/15/2018  . Iron deficiency anemia 02/15/2018  . Osteopenia 02/15/2018  . Malignant neoplasm of unspecified part of unspecified bronchus or lung ([place]) 09/02/2017  . 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 Goals of care, counseling/discussion 09/02/2017  . Pleural effusion, not elsewhere classified 07/11/2017  . Left upper quadrant pain 07/11/2017  . Urinary frequency 03/11/2017  . Kidney stone 03/11/2017  . Periumbilical abdominal pain 02/24/2017  . Back pain 02/20/2016  . Fatigue 09/18/2015  . History of right hip replacement 10/16/2014  . Renovascular hypertension   . RAS (renal artery stenosis) ([place]) 04/24/2014  . Hypertension 03/11/2014  . Rosacea 07/13/2013  . Insomnia 07/06/2012  . History of lung cancer 11/26/2011  . Hyperlipidemia with target LDL less than 100 11/26/2011  . Hyperlipidemia 05/21/2011  . Carotid stenosis 05/21/2011  . Lung cancer ([place]) 05/21/2011  . Sleep apnea 05/21/2011  . Osteoarthritis 05/21/2011    Past Surgical History:  Procedure Laterality Date  . APPENDECTOMY    . BILATERAL OOPHORECTOMY Bilateral 2000's  . CARDIAC CATHETERIZATION  04/24/2014  . CATARACT EXTRACTION W/ INTRAOCULAR LENS  IMPLANT, BILATERAL Bilateral   . JOINT REPLACEMENT    . LEFT HEART CATHETERIZATION  WITH CORONARY ANGIOGRAM N/A 04/24/2014   Procedure: LEFT HEART CATHETERIZATION WITH CORONARY ANGIOGRAM;  Surgeon: Wellington Hampshire, MD;  Location: [place] CATH LAB;  Service: Cardiovascular;  Laterality: N/A;  . LUNG LOBECTOMY Left 2003   "lower lobe"  . PERIPHERAL VASCULAR CATHETERIZATION  05/01/2014   Procedure: RENAL INTERVENTION;  Surgeon: Wellington Hampshire, MD; 6 x 15 mm Herculink stent to the Left Renal Artery  . PORTA CATH INSERTION N/A 09/26/2017   Procedure: PORTA CATH INSERTION;  Surgeon: Algernon Huxley, MD;  Location: [place] CV LAB;  Service:  Cardiovascular;  Laterality: N/A;  . RENAL ANGIOGRAM  04/24/2014  . RENAL ANGIOGRAM N/A 04/24/2014   Procedure: RENAL ANGIOGRAM;  Surgeon: Wellington Hampshire, MD;  Location: [place] CATH LAB;  Service: Cardiovascular;  Laterality: N/A;  . RENAL ANGIOGRAM Left 05/01/2014   Procedure: RENAL ANGIOGRAM;  Surgeon: Wellington Hampshire, MD;  Location: [place] CATH LAB;  Service: Cardiovascular;  Laterality: Left;  . REPLACEMENT TOTAL KNEE Left   . TOTAL HIP ARTHROPLASTY Right   . TUBAL LIGATION  ~ 1975  . VAGINAL DELIVERY  X 2  . VAGINAL HYSTERECTOMY  1980's    Prior to Admission medications   Medication Sig Start Date End Date Taking? Authorizing Provider  ALPRAZolam Duanne Moron) 0.25 MG tablet Take 0.25 mg by mouth daily as needed for anxiety. Take 1/2 tablet qd prn    [provider]  Ascorbic Acid (VITAMIN C CR) 1500 MG TBCR Take 1,500 mg by mouth daily.     [provider]  aspirin 81 MG tablet Take 81 mg by mouth daily.    [provider]  Biotin 2500 MCG CAPS Take 1 capsule by mouth daily.    [provider]  carvedilol (COREG) 12.5 MG tablet Take 12.5 mg by mouth 2 (two) times daily as needed.    [provider]  Cholecalciferol (VITAMIN D3) 5000 units CAPS Take 1 capsule by mouth daily.    [provider]  Coenzyme Q10 (CO Q-10) 200 MG CAPS Take 1 capsule by mouth 2 (two) times daily.    [provider]  docusate sodium (COLACE) 100 MG capsule TAKE 1CAP BY MOUTH DAILY AS NEEDED FOR MILD/MODERATE CONSTIPATION *DONT TAKE IF LOOSE STOOLS* 05/26/18   Earlie Server, MD  folic acid (FOLVITE) 1 MG tablet Take 1 tablet (1 mg total) by mouth daily. 05/26/18   Earlie Server, MD  glucosamine-chondroitin 500-400 MG tablet Take 1 tablet by mouth 2 (two) times daily.    [provider]  hydrALAZINE (APRESOLINE) 25 MG tablet Take 1 tablet (25 mg total) by mouth 3 (three) times daily. Patient taking differently: Take 25 mg by mouth 3 (three) times daily as needed.   07/23/14   Rise Mu, PA-C  lidocaine-prilocaine (EMLA) cream Apply to affected area once 09/26/17   Earlie Server, MD  losartan-hydrochlorothiazide (HYZAAR) 100-25 MG tablet Take 1 tablet by mouth daily as needed.    [provider]  Magnesium 500 MG CAPS Take 1 capsule by mouth daily.    [provider]  PARoxetine (PAXIL) 10 MG tablet TAKE 1 TABLET BY MOUTH EVERY DAY 04/25/18   Earlie Server, MD  prochlorperazine (COMPAZINE) 10 MG tablet Take 1 tablet (10 mg total) by mouth every 6 (six) hours as needed (Nausea or vomiting). 09/20/17   Earlie Server, MD  Red Yeast Rice Extract (RED YEAST RICE PO) Take 1,200 mg by mouth 2 (two) times daily.    [provider]  traMADol (ULTRAM) 50 MG tablet Take 1 tablet (50 mg total) by mouth daily as needed. For back pain/joint pain Patient taking differently: Take 50 mg by mouth daily as needed for moderate pain. For back pain/joint pain 04/20/17   McLean-Scocuzza, Nino Glow, MD  zinc gluconate 50 MG tablet Take 50 mg by mouth daily.    [provider]    Allergies Atorvastatin; Penicillins; Sulfa antibiotics; and Amlodipine  Family History  Problem Relation Age of Onset  . Parkinsonism Mother   . Alzheimer's disease Mother   . Sarcoidosis Brother   . Stroke Maternal Grandmother   . Stomach cancer Paternal Grandmother   . Diabetes Son   . Diabetes Daughter     Social History Social History   Tobacco Use  . Smoking status: Never Smoker  . Smokeless tobacco: Never Used  . Tobacco comment: Father and husband both smokers  Substance Use Topics  . Alcohol use: Yes    Comment: 04/24/2014 "glass of beer or wine q couple weeks"  . Drug use: No    Review of Systems Constitutional: No fever/chills Eyes: No visual changes. ENT: No sore throat. Cardiovascular: Denies chest pain. Respiratory: Positive for shortness of breath. Positive for cough.  Gastrointestinal: No abdominal pain.  No nausea, no vomiting.  No diarrhea.    Genitourinary: Negative for dysuria. Musculoskeletal: Negative for back pain. Skin: Negative for rash. Neurological: Negative for headaches, focal weakness or numbness.  ____________________________________________   PHYSICAL EXAM:  VITAL SIGNS: ED Triage Vitals [06/19/18 1000]  Enc Vitals Group     BP (!) 155/62     Pulse Rate 96     Resp      Temp 98.4 F (36.9 C)     Temp Source Oral     SpO2      Weight 116 lb 6.5 oz (52.8 kg)     Height      Head Circumference      Peak Flow      Pain Score 0   Constitutional: Alert and oriented.  Eyes: Conjunctivae are normal.  ENT      Head: Normocephalic and atraumatic.      Nose: No congestion/rhinnorhea.      Mouth/Throat: Mucous membranes are moist.      Neck: No stridor. Hematological/Lymphatic/Immunilogical: No cervical lymphadenopathy. Cardiovascular: Normal rate, regular rhythm.  No murmurs, rubs, or gallops.  Respiratory: Normal respiratory effort without tachypnea nor retractions. Rhonchi appreciated in right and left lung.  Gastrointestinal: Soft and non tender. No rebound. No guarding.  Genitourinary: Deferred Musculoskeletal: Normal range of motion in all extremities. No lower extremity edema. Neurologic:  Normal speech and language. No gross focal neurologic deficits are appreciated.  Skin:  Skin is warm, dry and intact. No rash noted. Psychiatric: Mood and affect are normal. Speech and behavior are normal. Patient exhibits appropriate insight and judgment.  ____________________________________________    LABS (pertinent positives/negatives)  CBC wbc 11.9, hgb 9.6, plt 413 BMP na 134, k 3.3, glu 111, cr 0.69 Trop <0.03 ____________________________________________   EKG  I, Nance Pear, attending physician, personally viewed and interpreted this EKG  EKG Time: 1007 Rate: 74 Rhythm: normal sinus rhythm Axis: normal Intervals: qtc 472 QRS: narrow, q waves v1, v2 ST changes: no st  elevation Impression: abnormal ekg   ____________________________________________    RADIOLOGY  CXR Bronchopneumonia vs drug reaction  ____________________________________________   PROCEDURES  Procedures  ____________________________________________   INITIAL IMPRESSION / ASSESSMENT AND PLAN / ED COURSE  Pertinent labs &  imaging results that were available during my care of the patient were reviewed by me and considered in my medical decision making (see chart for details).   Patient brought in because of concern for shortness of breath. cxr is concerning for possible bronchopneumonia vs drug reaction. At this time patient without any tachypnea or oxygen requirement. No fevers. Very minimal leukocytosis. I discussed the x-ray findings with the patient. At this time have lower suspicion for an infectious process given length of symptoms and otherwise benign work up. However out of an abundance of caution will start patient on antibiotics. Discussed with the patient that she should follow up with her oncologist. Also gave infection return precautions.   ____________________________________________   FINAL CLINICAL IMPRESSION(S) / ED DIAGNOSES  Final diagnoses:  SOB (shortness of breath)     Note: This dictation was prepared with Dragon dictation. Any transcriptional errors that result from this process are unintentional     Nance Pear, MD 06/19/18 1306

## 2018-06-19 NOTE — ED Notes (Signed)
Musician sent labs at this time.

## 2018-06-19 NOTE — ED Notes (Signed)
Received Report from [employer]

## 2018-06-23 ENCOUNTER — Telehealth: Payer: Self-pay | Admitting: *Deleted

## 2018-06-23 ENCOUNTER — Other Ambulatory Visit: Payer: Self-pay | Admitting: Oncology

## 2018-06-23 DIAGNOSIS — C3412 Malignant neoplasm of upper lobe, left bronchus or lung: Secondary | ICD-10-CM | POA: Diagnosis not present

## 2018-06-23 DIAGNOSIS — R059 Cough, unspecified: Secondary | ICD-10-CM

## 2018-06-23 DIAGNOSIS — R0602 Shortness of breath: Secondary | ICD-10-CM

## 2018-06-23 DIAGNOSIS — R05 Cough: Secondary | ICD-10-CM

## 2018-06-23 NOTE — Telephone Encounter (Signed)
Spoke with patient and her POA, [name].  Patient continues to be short of breath, not getting any better, from ED visit on Monday.    Request oxygen, Dr Tasia Catchings agreed, order placed, will be delivered this evening.    Order also placed for COVID-19 testing.  Patient was unable to make it to the testing sight in [place] before they closed today.   Advised pt to be seen at [facility] or ED as they have COVID-19 tests as well.  Patient states that she has not had an fever or chills and does not want to return to ED potentially being exposed to additional virus/illness.   Patient has requested DNR order.  Dr Tasia Catchings has signed.

## 2018-06-24 ENCOUNTER — Other Ambulatory Visit: Payer: Self-pay | Admitting: Oncology

## 2018-06-26 ENCOUNTER — Telehealth: Payer: Self-pay | Admitting: *Deleted

## 2018-06-26 DIAGNOSIS — C3412 Malignant neoplasm of upper lobe, left bronchus or lung: Secondary | ICD-10-CM | POA: Diagnosis not present

## 2018-06-26 NOTE — Telephone Encounter (Signed)
Pt's POA, [name], has contacted me stating that pt is doing better with oxygen.  She was running a temp of 100.7 on Friday evening, she took tylenol, returned again yesterday evening around 5pm, it was 99.0, took 2 tylenol, rechecked at 7:30pm was 100.1, respirations varied between 28-40 throughout the weekend, oxygen mostly set at 2L, occasionally at 3L.   Otherwise, eating and drinking well over the weekend, no other complaints.   Today, no fever. Requesting order for hospice order.  Confirmed with POA that her and the pt understood that no treatment will be given under hospice care, POA states that they both understand that.   Attempted to contact pt. She is asleep at this time.    Sending order for hospice as requested.  Discussed with Dr Tasia Catchings, Dr Tasia Catchings agrees with placing order for office.

## 2018-06-27 ENCOUNTER — Other Ambulatory Visit: Payer: Self-pay | Admitting: Oncology

## 2018-06-27 ENCOUNTER — Other Ambulatory Visit: Payer: Self-pay | Admitting: *Deleted

## 2018-06-27 MED ORDER — MORPHINE SULFATE (CONCENTRATE) 20 MG/ML PO SOLN: 10.0000 mg | ORAL | 0 refills | Status: AC | PRN

## 2018-06-27 NOTE — Telephone Encounter (Signed)
Needs to be sent to [place].  Removed and changed pharmacy.

## 2018-06-27 NOTE — Telephone Encounter (Signed)
Morphine Rx was sent to pharmacy

## 2018-06-27 NOTE — Telephone Encounter (Signed)
Hospice nurse called requesting medicine for patient with shortness of breath. Please advise

## 2018-06-29 ENCOUNTER — Encounter: Payer: Self-pay | Admitting: Oncology

## 2018-06-29 ENCOUNTER — Telehealth: Payer: Self-pay | Admitting: *Deleted

## 2018-06-30 ENCOUNTER — Telehealth: Payer: Self-pay | Admitting: *Deleted

## 2018-06-30 NOTE — Telephone Encounter (Signed)
Patient's POA, [name], is requesting for postmortem COVID-19 testing to be done.  Discussed with Dr Tasia Catchings.  Dr Tasia Catchings agrees to reach out to the medical examiner to see if this can be done.

## 2018-06-30 NOTE — Telephone Encounter (Signed)
Thank you.  I have called [place] examiner's office [phone] and talked to [employer]. I was informed that medical examiner will not perform post mortem Covid-19 testing.  I am reaching out to nursing supervisor of the hospital and [facility] advisory board Dr.David Martinique and awaiting response.   Please update patient's POA. Thank you.

## 2018-07-03 ENCOUNTER — Inpatient Hospital Stay: Payer: Medicare HMO | Admitting: Oncology

## 2018-07-03 ENCOUNTER — Inpatient Hospital Stay: Payer: Medicare HMO

## 2018-07-03 NOTE — Telephone Encounter (Signed)
Notified POA, [name], of the Medical Examiners response

## 2018-07-04 ENCOUNTER — Telehealth: Payer: Self-pay | Admitting: *Deleted

## 2018-07-04 NOTE — Telephone Encounter (Signed)
Tuesday, June 27, 2018. - [place], [name] contacted me to notify me that hospice came to evaluate pt, Corrina. "Rayya is in her right mind", told hospice nurse she did not want to go anywhere (ED), ativan and morphine had helped Jasmyn, breathing still at 40-50 times per min, with O2 at 3.5liters. Hospice nurse told pt & POA that a nurse will call the following day & advised them to be seen at ED if symptoms worsen. Pt refused to seek care at ED.   Wednesday, June 28, 2018 - [place], [name] contacted me in the evening very upset. [name] says that Hospice was due to be at pt's home to pick her up at around 4pm, around 4:30pm she received a call from hospice home stating that they are unable to pick her up due to the recent fever and orders for COVID-19, that was never tested for, they are unable to bring her to the hospice home due to the possibility of pt having the corona virus.  [name] reached out to me asking if there was anything Dr Tasia Catchings could do in helping her with this situation, I called Dr Tasia Catchings, DrYu says unfortunately we are unable to help in this situation, notified [name] of Dr Collie Siad response.   07/03/18 - [place], reached out to me asking if Dr Tasia Catchings would request for COVID-19 test to be done by the medical examiner.   Friday, March 27,20 - Dr Tasia Catchings agrees to the request for COVID-19 to be done by medical examiner.  Received message that medical examiner declined the request for testing.   IEPPIRJ, March 28, 20 - Notified [name] that medical examiner declined request for COVID-19 testing   Monday, March 30,20 [name] request a letter for work stating that she was with Arbie Cookey who was "experiencing fever and shortness of breath, with a questionable xray" ... she states that she has been self-isolating and has told her employer that she can work from home, just would like a note if possible.   Tuesday, July 04, 2018 - per Dr Tasia Catchings, notified pt that she will need to contact PCP for the requested letter for work.   [name] says that she has been experiencing at [facility] of about 100.0, has been rotating tylenol and advil, has had a headache and body aches, could be due to her being very tired and exhausted. Notified [name] to contact PCP, she says that her employer, [employer], has not required her to show any documentation of the possible exposure, she should be okay.

## 2018-07-05 NOTE — Telephone Encounter (Signed)
Hospice nurse Vivien Rota called to report that patient expired this morning at 9:10 AM

## 2018-07-05 DEATH — deceased

## 2018-08-16 ENCOUNTER — Ambulatory Visit: Payer: Medicare HMO | Admitting: Internal Medicine

## 2018-08-29 ENCOUNTER — Ambulatory Visit: Payer: Medicare HMO | Admitting: Internal Medicine

## 2018-08-29 ENCOUNTER — Ambulatory Visit: Payer: Medicare HMO

## 2019-02-08 ENCOUNTER — Ambulatory Visit (INDEPENDENT_AMBULATORY_CARE_PROVIDER_SITE_OTHER): Payer: Medicare HMO | Admitting: Vascular Surgery

## 2019-02-08 ENCOUNTER — Encounter (INDEPENDENT_AMBULATORY_CARE_PROVIDER_SITE_OTHER): Payer: Medicare HMO
